# Patient Record
Sex: Female | Born: 1935 | Race: White | Hispanic: No | Smoking: Current every day smoker
Health system: Southern US, Community
[De-identification: ages and names within clinical notes are randomized; demographics above are authoritative.]

## PROBLEM LIST (undated history)

## (undated) DIAGNOSIS — F419 Anxiety disorder, unspecified: Secondary | ICD-10-CM

## (undated) DIAGNOSIS — N319 Neuromuscular dysfunction of bladder, unspecified: Secondary | ICD-10-CM

## (undated) DIAGNOSIS — R911 Solitary pulmonary nodule: Secondary | ICD-10-CM

## (undated) DIAGNOSIS — W19XXXA Unspecified fall, initial encounter: Secondary | ICD-10-CM

## (undated) DIAGNOSIS — F0390 Unspecified dementia without behavioral disturbance: Secondary | ICD-10-CM

## (undated) DIAGNOSIS — I10 Essential (primary) hypertension: Secondary | ICD-10-CM

## (undated) DIAGNOSIS — J449 Chronic obstructive pulmonary disease, unspecified: Secondary | ICD-10-CM

---

## 2005-12-05 ENCOUNTER — Other Ambulatory Visit: Payer: Self-pay

## 2005-12-05 ENCOUNTER — Emergency Department: Payer: Self-pay | Admitting: Emergency Medicine

## 2008-01-15 ENCOUNTER — Ambulatory Visit: Payer: Self-pay | Admitting: Internal Medicine

## 2008-02-17 ENCOUNTER — Ambulatory Visit: Payer: Self-pay | Admitting: Internal Medicine

## 2009-02-10 ENCOUNTER — Emergency Department: Payer: Self-pay | Admitting: Emergency Medicine

## 2015-06-07 ENCOUNTER — Inpatient Hospital Stay: Payer: Medicare Other

## 2015-06-07 ENCOUNTER — Emergency Department: Payer: Medicare Other

## 2015-06-07 ENCOUNTER — Encounter: Payer: Self-pay | Admitting: Emergency Medicine

## 2015-06-07 ENCOUNTER — Inpatient Hospital Stay
Admission: EM | Admit: 2015-06-07 | Discharge: 2015-06-21 | DRG: 870 | Disposition: A | Discharge status: Hospice | Payer: Medicare Other | Attending: Internal Medicine | Admitting: Internal Medicine

## 2015-06-07 DIAGNOSIS — I700 Atherosclerosis of aorta: Secondary | ICD-10-CM | POA: Diagnosis present

## 2015-06-07 DIAGNOSIS — Y929 Unspecified place or not applicable: Secondary | ICD-10-CM | POA: Diagnosis not present

## 2015-06-07 DIAGNOSIS — Z452 Encounter for adjustment and management of vascular access device: Secondary | ICD-10-CM

## 2015-06-07 DIAGNOSIS — R6521 Severe sepsis with septic shock: Secondary | ICD-10-CM | POA: Diagnosis present

## 2015-06-07 DIAGNOSIS — I10 Essential (primary) hypertension: Secondary | ICD-10-CM | POA: Diagnosis present

## 2015-06-07 DIAGNOSIS — J9801 Acute bronchospasm: Secondary | ICD-10-CM | POA: Diagnosis present

## 2015-06-07 DIAGNOSIS — R627 Adult failure to thrive: Secondary | ICD-10-CM | POA: Diagnosis present

## 2015-06-07 DIAGNOSIS — Z66 Do not resuscitate: Secondary | ICD-10-CM | POA: Diagnosis not present

## 2015-06-07 DIAGNOSIS — F0280 Dementia in other diseases classified elsewhere without behavioral disturbance: Secondary | ICD-10-CM | POA: Diagnosis present

## 2015-06-07 DIAGNOSIS — J690 Pneumonitis due to inhalation of food and vomit: Secondary | ICD-10-CM | POA: Diagnosis present

## 2015-06-07 DIAGNOSIS — E872 Acidosis: Secondary | ICD-10-CM | POA: Diagnosis present

## 2015-06-07 DIAGNOSIS — R911 Solitary pulmonary nodule: Secondary | ICD-10-CM | POA: Diagnosis present

## 2015-06-07 DIAGNOSIS — L8990 Pressure ulcer of unspecified site, unspecified stage: Secondary | ICD-10-CM | POA: Insufficient documentation

## 2015-06-07 DIAGNOSIS — Z515 Encounter for palliative care: Secondary | ICD-10-CM | POA: Diagnosis not present

## 2015-06-07 DIAGNOSIS — E43 Unspecified severe protein-calorie malnutrition: Secondary | ICD-10-CM | POA: Diagnosis present

## 2015-06-07 DIAGNOSIS — J9622 Acute and chronic respiratory failure with hypercapnia: Secondary | ICD-10-CM | POA: Diagnosis present

## 2015-06-07 DIAGNOSIS — W19XXXA Unspecified fall, initial encounter: Secondary | ICD-10-CM | POA: Diagnosis present

## 2015-06-07 DIAGNOSIS — J9601 Acute respiratory failure with hypoxia: Secondary | ICD-10-CM | POA: Diagnosis present

## 2015-06-07 DIAGNOSIS — G309 Alzheimer's disease, unspecified: Secondary | ICD-10-CM | POA: Diagnosis present

## 2015-06-07 DIAGNOSIS — N390 Urinary tract infection, site not specified: Secondary | ICD-10-CM

## 2015-06-07 DIAGNOSIS — F17210 Nicotine dependence, cigarettes, uncomplicated: Secondary | ICD-10-CM | POA: Diagnosis present

## 2015-06-07 DIAGNOSIS — E860 Dehydration: Secondary | ICD-10-CM | POA: Diagnosis present

## 2015-06-07 DIAGNOSIS — Z4659 Encounter for fitting and adjustment of other gastrointestinal appliance and device: Secondary | ICD-10-CM

## 2015-06-07 DIAGNOSIS — S225XXA Flail chest, initial encounter for closed fracture: Secondary | ICD-10-CM | POA: Diagnosis present

## 2015-06-07 DIAGNOSIS — J189 Pneumonia, unspecified organism: Secondary | ICD-10-CM

## 2015-06-07 DIAGNOSIS — J9600 Acute respiratory failure, unspecified whether with hypoxia or hypercapnia: Secondary | ICD-10-CM

## 2015-06-07 DIAGNOSIS — M549 Dorsalgia, unspecified: Secondary | ICD-10-CM

## 2015-06-07 DIAGNOSIS — J9621 Acute and chronic respiratory failure with hypoxia: Secondary | ICD-10-CM | POA: Insufficient documentation

## 2015-06-07 DIAGNOSIS — J9690 Respiratory failure, unspecified, unspecified whether with hypoxia or hypercapnia: Secondary | ICD-10-CM

## 2015-06-07 DIAGNOSIS — E876 Hypokalemia: Secondary | ICD-10-CM | POA: Diagnosis present

## 2015-06-07 DIAGNOSIS — J9811 Atelectasis: Secondary | ICD-10-CM | POA: Diagnosis present

## 2015-06-07 DIAGNOSIS — Z681 Body mass index (BMI) 19 or less, adult: Secondary | ICD-10-CM | POA: Diagnosis not present

## 2015-06-07 DIAGNOSIS — J439 Emphysema, unspecified: Secondary | ICD-10-CM | POA: Diagnosis not present

## 2015-06-07 DIAGNOSIS — Z79899 Other long term (current) drug therapy: Secondary | ICD-10-CM | POA: Diagnosis not present

## 2015-06-07 DIAGNOSIS — R1310 Dysphagia, unspecified: Secondary | ICD-10-CM | POA: Diagnosis present

## 2015-06-07 DIAGNOSIS — J441 Chronic obstructive pulmonary disease with (acute) exacerbation: Secondary | ICD-10-CM | POA: Diagnosis present

## 2015-06-07 DIAGNOSIS — R64 Cachexia: Secondary | ICD-10-CM | POA: Diagnosis present

## 2015-06-07 DIAGNOSIS — M545 Low back pain: Secondary | ICD-10-CM | POA: Diagnosis present

## 2015-06-07 DIAGNOSIS — R739 Hyperglycemia, unspecified: Secondary | ICD-10-CM | POA: Diagnosis not present

## 2015-06-07 DIAGNOSIS — A419 Sepsis, unspecified organism: Secondary | ICD-10-CM | POA: Diagnosis present

## 2015-06-07 DIAGNOSIS — R0902 Hypoxemia: Secondary | ICD-10-CM

## 2015-06-07 DIAGNOSIS — J449 Chronic obstructive pulmonary disease, unspecified: Secondary | ICD-10-CM | POA: Diagnosis not present

## 2015-06-07 DIAGNOSIS — Z978 Presence of other specified devices: Secondary | ICD-10-CM

## 2015-06-07 HISTORY — DX: Unspecified dementia, unspecified severity, without behavioral disturbance, psychotic disturbance, mood disturbance, and anxiety: F03.90

## 2015-06-07 HISTORY — DX: Chronic obstructive pulmonary disease, unspecified: J44.9

## 2015-06-07 HISTORY — DX: Essential (primary) hypertension: I10

## 2015-06-07 LAB — URINALYSIS COMPLETE WITH MICROSCOPIC (ARMC ONLY)
BILIRUBIN URINE: NEGATIVE
Glucose, UA: 50 mg/dL — AB
Ketones, ur: NEGATIVE mg/dL
Nitrite: NEGATIVE
Protein, ur: 100 mg/dL — AB
Specific Gravity, Urine: 1.027 (ref 1.005–1.030)
pH: 5 (ref 5.0–8.0)

## 2015-06-07 LAB — CBC WITH DIFFERENTIAL/PLATELET
BASOS PCT: 1 %
Basophils Absolute: 0.4 10*3/uL — ABNORMAL HIGH (ref 0–0.1)
EOS PCT: 0 %
Eosinophils Absolute: 0 10*3/uL (ref 0–0.7)
HCT: 45.4 % (ref 35.0–47.0)
Hemoglobin: 15.3 g/dL (ref 12.0–16.0)
LYMPHS ABS: 1.3 10*3/uL (ref 1.0–3.6)
Lymphocytes Relative: 3 %
MCH: 28.6 pg (ref 26.0–34.0)
MCHC: 33.6 g/dL (ref 32.0–36.0)
MCV: 85.2 fL (ref 80.0–100.0)
MONO ABS: 3.4 10*3/uL — AB (ref 0.2–0.9)
Monocytes Relative: 8 %
NEUTROS ABS: 37 10*3/uL — AB (ref 1.4–6.5)
Neutrophils Relative %: 88 %
PLATELETS: 375 10*3/uL (ref 150–440)
RBC: 5.33 MIL/uL — ABNORMAL HIGH (ref 3.80–5.20)
RDW: 13.6 % (ref 11.5–14.5)
WBC: 42.1 10*3/uL — AB (ref 3.6–11.0)

## 2015-06-07 LAB — COMPREHENSIVE METABOLIC PANEL
ALT: 14 U/L (ref 14–54)
AST: 34 U/L (ref 15–41)
Albumin: 3.4 g/dL — ABNORMAL LOW (ref 3.5–5.0)
Alkaline Phosphatase: 99 U/L (ref 38–126)
Anion gap: 10 (ref 5–15)
BUN: 29 mg/dL — ABNORMAL HIGH (ref 6–20)
CALCIUM: 9.9 mg/dL (ref 8.9–10.3)
CHLORIDE: 101 mmol/L (ref 101–111)
CO2: 29 mmol/L (ref 22–32)
CREATININE: 0.85 mg/dL (ref 0.44–1.00)
GFR calc Af Amer: >60 mL/min (ref >60)
Glucose, Bld: 139 mg/dL — ABNORMAL HIGH (ref 65–99)
Potassium: 2.8 mmol/L — CL (ref 3.5–5.1)
Sodium: 140 mmol/L (ref 135–145)
TOTAL PROTEIN: 8 g/dL (ref 6.5–8.1)
Total Bilirubin: 0.7 mg/dL (ref 0.3–1.2)

## 2015-06-07 LAB — LACTIC ACID, PLASMA
Lactic Acid, Venous: 1.9 mmol/L (ref 0.5–2.0)
Lactic Acid, Venous: 1.1 mmol/L (ref 0.5–2.0)

## 2015-06-07 LAB — TROPONIN I: TROPONIN I: 0.03 ng/mL (ref <0.031)

## 2015-06-07 LAB — BRAIN NATRIURETIC PEPTIDE: B NATRIURETIC PEPTIDE 5: 127 pg/mL — AB (ref 0.0–100.0)

## 2015-06-07 MED ORDER — SODIUM CHLORIDE 0.9 % IV BOLUS (SEPSIS)
1000.0000 mL | Freq: Once | INTRAVENOUS | Status: AC
  Administered 2015-06-07: 1000 mL via INTRAVENOUS

## 2015-06-07 MED ORDER — LIDOCAINE HCL (PF) 1 % IJ SOLN
5.0000 mL | Freq: Once | INTRAMUSCULAR | Status: AC
  Administered 2015-06-07: 5 mL

## 2015-06-07 MED ORDER — LIDOCAINE HCL (PF) 1 % IJ SOLN
INTRAMUSCULAR | Status: AC
  Administered 2015-06-07: 5 mL
  Filled 2015-06-07: qty 10

## 2015-06-07 MED ORDER — PIPERACILLIN-TAZOBACTAM 3.375 G IVPB
3.3750 g | Freq: Once | INTRAVENOUS | Status: AC
  Administered 2015-06-07: 3.375 g via INTRAVENOUS
  Filled 2015-06-07: qty 50

## 2015-06-07 MED ORDER — NOREPINEPHRINE 4 MG/250ML-% IV SOLN
INTRAVENOUS | Status: AC
  Filled 2015-06-07: qty 250

## 2015-06-07 MED ORDER — SODIUM CHLORIDE 0.9 % IV SOLN
Freq: Once | INTRAVENOUS | Status: AC
  Administered 2015-06-08: 01:00:00 via INTRAVENOUS

## 2015-06-07 MED ORDER — NOREPINEPHRINE 4 MG/250ML-% IV SOLN
0.0000 ug/min | INTRAVENOUS | Status: DC
Start: 2015-06-07 — End: 2015-06-09
  Administered 2015-06-07: 5.34 ug/min via INTRAVENOUS
  Administered 2015-06-08 (×2): 26.933 ug/min via INTRAVENOUS
  Filled 2015-06-07 (×8): qty 250

## 2015-06-07 MED ORDER — HALOPERIDOL LACTATE 5 MG/ML IJ SOLN
2.0000 mg | Freq: Once | INTRAMUSCULAR | Status: AC
  Administered 2015-06-07: 2 mg via INTRAVENOUS

## 2015-06-07 MED ORDER — HYDROCODONE-ACETAMINOPHEN 5-325 MG PO TABS: 1.0000 | ORAL_TABLET | ORAL | Status: DC | PRN

## 2015-06-07 MED ORDER — VANCOMYCIN HCL IN DEXTROSE 1-5 GM/200ML-% IV SOLN
1000.0000 mg | Freq: Once | INTRAVENOUS | Status: AC
  Administered 2015-06-07: 1000 mg via INTRAVENOUS
  Filled 2015-06-07: qty 200

## 2015-06-07 MED ORDER — LORAZEPAM 2 MG/ML IJ SOLN
INTRAMUSCULAR | Status: AC
  Administered 2015-06-07: 2 mg via INTRAVENOUS
  Filled 2015-06-07: qty 1

## 2015-06-07 MED ORDER — LIDOCAINE HCL (PF) 1 % IJ SOLN
INTRAMUSCULAR | Status: AC
  Administered 2015-06-07: 5 mL
  Filled 2015-06-07: qty 20

## 2015-06-07 MED ORDER — POTASSIUM CHLORIDE 10 MEQ/100ML IV SOLN
10.0000 meq | Freq: Once | INTRAVENOUS | Status: AC
Start: 2015-06-07 — End: 2015-06-07
  Administered 2015-06-07: 10 meq via INTRAVENOUS
  Filled 2015-06-07: qty 100

## 2015-06-07 MED ORDER — SODIUM CHLORIDE 0.9 % IV SOLN
Freq: Once | INTRAVENOUS | Status: AC
Start: 2015-06-07 — End: 2015-06-08
  Administered 2015-06-07: 21:00:00 via INTRAVENOUS

## 2015-06-07 MED ORDER — IOPAMIDOL (ISOVUE-370) INJECTION 76%
75.0000 mL | Freq: Once | INTRAVENOUS | Status: AC | PRN
  Administered 2015-06-07: 75 mL via INTRAVENOUS

## 2015-06-07 MED ORDER — HALOPERIDOL LACTATE 5 MG/ML IJ SOLN
5.0000 mg | Freq: Once | INTRAMUSCULAR | Status: AC
  Administered 2015-06-07: 5 mg via INTRAVENOUS

## 2015-06-07 MED ORDER — POTASSIUM CHLORIDE 10 MEQ/100ML IV SOLN
10.0000 meq | Freq: Once | INTRAVENOUS | Status: AC
  Administered 2015-06-07: 10 meq via INTRAVENOUS
  Filled 2015-06-07: qty 100

## 2015-06-07 MED ORDER — HALOPERIDOL LACTATE 5 MG/ML IJ SOLN
INTRAMUSCULAR | Status: AC
  Administered 2015-06-07: 5 mg via INTRAVENOUS
  Filled 2015-06-07: qty 1

## 2015-06-07 MED ORDER — NOREPINEPHRINE BITARTRATE 1 MG/ML IV SOLN
0.1000 ug/kg/min | Freq: Once | INTRAVENOUS | Status: DC
  Administered 2015-06-07: 0.1 ug/kg/min via INTRAVENOUS

## 2015-06-07 MED ORDER — LORAZEPAM 2 MG/ML IJ SOLN
2.0000 mg | Freq: Once | INTRAMUSCULAR | Status: AC
  Administered 2015-06-07: 2 mg via INTRAVENOUS

## 2015-06-07 MED ORDER — AZITHROMYCIN 500 MG IV SOLR
500.0000 mg | Freq: Once | INTRAVENOUS | Status: AC
  Administered 2015-06-07: 500 mg via INTRAVENOUS
  Filled 2015-06-07: qty 500

## 2015-06-07 MED ORDER — HALOPERIDOL LACTATE 5 MG/ML IJ SOLN
2.0000 mg | Freq: Four times a day (QID) | INTRAMUSCULAR | Status: DC | PRN
  Filled 2015-06-07: qty 1

## 2015-06-07 NOTE — ED Notes (Signed)
Report given to Chad, RN.

## 2015-06-07 NOTE — H&P (Signed)
[facility] - [place] at [facility]   PATIENT NAME: Leah [name]    MR#:  [id number]  DATE OF BIRTH:  05-04-1932  DATE OF ADMISSION:  06/07/2015  PRIMARY CARE PHYSICIAN: Jaclyn Shaggy, MD   REQUESTING/REFERRING PHYSICIAN: Dr. Dorothea Glassman  CHIEF COMPLAINT:   Chief Complaint  Patient presents with  . Failure To Thrive  . Fall    HISTORY OF PRESENT ILLNESS:  Leah [name]  is a 82 y.o. female with a known history of dementia, COPD, essential hypertension who presents to the hospital due to generalized weakness shortness of breath cough and congestion. She herself has advanced dementia most of the history obtained from the sons at bedside. As per the son and he came today to see her mother to take her to her doctor's appointment when he noticed it she was too weak to get out of bed. Patient has had a fall about 2 days ago and has been not feeling well since then. She does have a long history of tobacco abuse and continues to smoke about a pack a day. The sons noticed that she's had a upper airway cough and congestion and feels more short of breath and therefore brought her to the ER for further evaluation. Emergency room patient was noted to be hypoxic or tachycardic and noted to have a CT chest findings suggestive of aspiration pneumonia. Hospitalist services were contacted further treatment and evaluation.  PAST MEDICAL HISTORY:   Past Medical History  Diagnosis Date  . Dementia   . COPD (chronic obstructive pulmonary disease) (HCC)   . Essential hypertension     PAST SURGICAL HISTORY:  History reviewed. No pertinent past surgical history.  SOCIAL HISTORY:   Social History  Substance Use Topics  . Smoking status: Current Every Day Smoker -- 1.00 packs/day for 60 years    Types: Cigarettes  . Smokeless tobacco: Never Used  . Alcohol Use: No    FAMILY HISTORY:  History reviewed. No pertinent family history.  Could not obtain due to patient's  advanced Dementia.   DRUG ALLERGIES:  No Known Allergies  REVIEW OF SYSTEMS:   Review of Systems  Unable to perform ROS: mental acuity    MEDICATIONS AT HOME:   Prior to Admission medications   Medication Sig Start Date End Date Taking? Authorizing Provider  donepezil (ARICEPT) 5 MG tablet Take 5 mg by mouth at bedtime.   Yes Historical Provider, MD  hydrochlorothiazide (HYDRODIURIL) 25 MG tablet Take 25 mg by mouth daily.   Yes Historical Provider, MD  ibuprofen (ADVIL,MOTRIN) 200 MG tablet Take 400 mg by mouth every 6 (six) hours as needed.   Yes Historical Provider, MD  verapamil (CALAN-SR) 240 MG CR tablet Take 240 mg by mouth daily.   Yes Historical Provider, MD      VITAL SIGNS:  Pulse 91, temperature 99.1 F (37.3 C), temperature source Oral, resp. rate 34, height  (1.676 m), weight 53.434 kg (117 lb 12.8 oz), SpO2 91 %.  PHYSICAL EXAMINATION:  Physical Exam  GENERAL:  82 y.o.-year-old unkempt patient lying in the bed in moderate Resp. Distress.  EYES: Pupils equal, round, reactive to light and accommodation. No scleral icterus. Extraocular muscles intact.  HEENT: Head atraumatic, normocephalic. Oropharynx and nasopharynx clear. No oropharyngeal erythema, dry oral mucosa.  NECK:  Supple, no jugular venous distention. No thyroid enlargement, no tenderness.  LUNGS: Prolonged inspiratory and expiratory phase, diffuse wheezing, rhonchi bilaterally. Negative use of accessory muscles. CARDIOVASCULAR: S1, S2 RRR,  tachycardic. No murmurs, rubs, gallops, clicks.  ABDOMEN: Soft, nontender, nondistended. Bowel sounds present. No organomegaly or mass.  EXTREMITIES: No pedal edema, cyanosis, + clubbing b/l. + 2 pedal & radial pulses b/l.   NEUROLOGIC: Cranial nerves II through XII are intact. No focal Motor or sensory deficits appreciated b/l.  Globally weak PSYCHIATRIC: The patient is alert and oriented x 1.  SKIN: No obvious rash, lesion, or ulcer.   LABORATORY PANEL:    CBC  Recent Labs Lab 06/07/15 1132  WBC 42.1*  HGB 15.3  HCT 45.4  PLT 375   ------------------------------------------------------------------------------------------------------------------  Chemistries   Recent Labs Lab 06/07/15 1132  NA 140  K 2.8*  CL 101  CO2 29  GLUCOSE 139*  BUN 29*  CREATININE 0.85  CALCIUM 9.9  AST 34  ALT 14  ALKPHOS 99  BILITOT 0.7   ------------------------------------------------------------------------------------------------------------------  Cardiac Enzymes  Recent Labs Lab 06/07/15 1132  TROPONINI 0.03   ------------------------------------------------------------------------------------------------------------------  RADIOLOGY:  Ct Head Wo Contrast  06/07/2015  CLINICAL DATA:  Altered mental status. EXAM: CT HEAD WITHOUT CONTRAST TECHNIQUE: Contiguous axial images were obtained from the base of the skull through the vertex without intravenous contrast. COMPARISON:  None. FINDINGS: Bony calvarium appears intact. Mild diffuse cortical atrophy is noted. Moderate chronic ischemic white matter disease is noted. No mass effect or midline shift is noted. Ventricular size is within normal limits. There is no evidence of mass lesion, hemorrhage or acute infarction. IMPRESSION: Mild diffuse cortical atrophy. Moderate chronic ischemic white matter disease. No acute intracranial abnormality seen. Electronically Signed   By: Lupita RaiderJames  Green Jr, M.D.   On: 06/07/2015 13:35   Ct Angio Chest Pe W/cm &/or Wo Cm  06/07/2015  CLINICAL DATA:  82 year old female with hypoxia, abnormal EKG, low grade fever. Initial encounter. EXAM: CT ANGIOGRAPHY CHEST WITH CONTRAST TECHNIQUE: Multidetector CT imaging of the chest was performed using the standard protocol during bolus administration of intravenous contrast. Multiplanar CT image reconstructions and MIPs were obtained to evaluate the vascular anatomy. CONTRAST:  75 mL Isovue 370 COMPARISON:  Chest  radiographs 1120 hours today and earlier. FINDINGS: Good contrast bolus timing in the pulmonary arterial tree. No focal filling defect identified in the pulmonary arterial tree to suggest the presence of acute pulmonary embolism. 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 The left vertebral artery arises directly from the arch and is patent. Other visualized proximal great vessels are patent. There is severe aortic atherosclerosis with calcified and bulky soft plaque or mural thrombus. Ulcerated plaque, most notable in the descending thoracic aorta (series 5, image 207). Intermittent retained secretions in the trachea. Complete opacification of the right lower lobe bronchus with subtotal consolidation of the right lower lobe. Air bronchograms. Trace right pleural effusion. In the aerated portion of the superior segment of the right lower lobe there is peribronchial and tree-in-bud nodularity. Superimposed centrilobular emphysema in both lungs. Mild streaky opacity in the posterior right middle lobe. Spiculated 10 mm left upper lobe lung nodule (coronal image 67 of series 9). Small benign appearing lingula subpleural nodule on series 6, image 105. No left pleural or pericardial effusion. Reactive appearing right hilar lymph nodes, individually up to 10 mm. No mediastinal lymphadenopathy. No axillary lymphadenopathy. Negative visualized liver, spleen, pancreas, adrenal glands, kidneys, and bowel in the upper abdomen. Mild chronic appearing inferior T10 endplate compression. Elsewhere the visible spine appears intact. Sternum appears intact. There is some respiratory motion artifact affecting detail of the  ribs. However, the right posterior tenth eleventh, and twelfth ribs are each fractured in at least 2 places. Moderate angulation of the tenth and eleventh rib fractures. Mildly displaced single fracture of the lateral ninth rib identified. See series 6. Review of  the MIP images confirms the above findings. IMPRESSION: 1.  No evidence of acute pulmonary embolus. 2. Acute appearing comminuted and angulated fractures of the posterior right ninth through twelfth ribs with flail segments. 3. Subtotal right lower lobe consolidation. Opacified right mainstem bronchus, with multifocal retained secretions in the trachea. Reactive appearing right hilar lymphadenopathy. Top differential considerations include pneumonia following a large aspiration versus following right rib fractures. No significant pleural effusion. 4. Spiculated left upper lobe 10 mm lung nodule suspicious for small bronchogenic carcinoma. Consider one of the following in 3 months: (a) repeat chest CT, (b) follow-up PET-CT, or (c) tissue sampling. This recommendation follows the consensus statement: Guidelines for Management of Incidental Pulmonary Nodules Detected on CT Images:From the Fleischner Society 2017; published online before print (10.1148/radiol.1610960454). 5. Severe aortic atherosclerosis. Areas of ulcerated plaque most pronounced in the descending thoracic aorta. 6. 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 Electronically Signed   By: Odessa Fleming M.D.   On: 06/07/2015 13:43   Dg Chest Portable 1 View  06/07/2015  CLINICAL DATA:  Shortness of breath, dementia, history of Alzheimer's, cough, congestion EXAM: PORTABLE CHEST 1 VIEW COMPARISON:  02/10/2009 FINDINGS: Cardiomediastinal silhouette is stable. The patient is rotated to the right. Hazy right basilar atelectasis or an infiltrate. No pulmonary edema. Left lung is clear. IMPRESSION: Hazy right basilar atelectasis or early infiltrate. No pulmonary edema. Left lung is clear. Electronically Signed   By: Natasha Mead M.D.   On: 06/07/2015 11:35     IMPRESSION AND PLAN:   82 year old female with past medical history of advanced dementia, hypertension, COPD who presents to the hospital due to generalized  weakness and noted to be in acute respiratory failure with hypoxia.  #1 acute respiratory failure with hypoxia-this is secondary to underlying COPD exacerbation, came with pneumonia. -Continue treatment for underlying COPD with IV steroids, DuoNeb's, Pulmicort nebs and also empiric antibiotics for pneumonia. -Continue O2 supplementation and will monitor.  #2 COPD exacerbation-due to pneumonia and ongoing tobacco abuse. -I will start the patient on IV steroids, DuoNeb nebs around-the-clock, Pulmicort nebs. -Continue IV Zosyn for aspiration pneumonia. She will need to be assessed for home oxygen prior to discharge.  #3 pneumonia-likely aspiration. -Continue IV Zosyn. Follow blood, sputum cultures. -We'll get speech therapy evaluation to assess swallowing.  #4 essential hypertension-patient's blood pressures on the low side given her respiratory failure presently.  -Hold antihypertensives.  #5 hypokalemia-will replace accordingly and repeat level in the morning. Check magnesium level.  #6 back pain-patient had a recent fall. I will get x-ray of her lumbar spine.  #7 spiculated lung nodule-this was noted on the CT chest. Patient did have a long history of tobacco abuse and high risk for malignancy. She would benefit from a PET scan as an outpatient. -Patient has no acute hemoptysis. Consider pulmonary consult if needed  #8. Leukocytosis-secondary to the pneumonia. Follow white cell count with IV antibiotic therapy.  #9 history of falls-the physical therapy evaluation to assess mobility.   All the records are reviewed and case discussed with ED provider. Management plans discussed with the patient, family and they are in agreement.  CODE STATUS: DO NOT RESUSCITATE  TOTAL TIME TAKING CARE OF THIS PATIENT: 50 minutes.  Houston Siren M.D on 06/07/2015 at 3:19 PM  Between 7am to 6pm - Pager - [phone]  After 6pm go to [email or web address] - password [email or web address] [facility]  [place]  Hospitalists  Office  [phone]  CC: Primary care physician; Jaclyn Shaggy, MD

## 2015-06-07 NOTE — ED Notes (Signed)
Assist MD Melinda to set up and insert femoral triple lumen central line. Pt tolerated well. Sterile field maintained, each lumen has good blood return and flushes well.

## 2015-06-07 NOTE — ED Notes (Signed)
Pt placed of 40% FiO2 on a venti mask.

## 2015-06-07 NOTE — ED Notes (Signed)
Pt presents from home where she lives with her daughter and grandson. EMS was called to the house by her son. Per EMS the son states that pt has been refusing meds, and not eating. EMS states that pt has a hx of dementia and alzheimer's at this time. EMS states that when they asked the patient's son about changes and the son was unable to give specific details. EMS states that pt has chronic congested cough and continues to smoke at home. EMS reports an O2 sat of 86% on RA, placed on 2L en route, pt placed on 5L in ED and patient O2 sat 91%. Pt presents alert and oriented to self and place, but disoriented time, and situation.

## 2015-06-07 NOTE — ED Provider Notes (Addendum)
[facility] Emergency Department Provider Note  ____________________________________________  Time seen: Approximately 11:23 AM  I have reviewed the triage vital signs and the nursing notes.   HISTORY  Chief Complaint Failure To Thrive and Fall  history limited by dementia   HPI Leah [name] is a 82 y.o. female who lives at home. She is demented but has been getting less and less cooperative refusing her medicines and today she appeared to be breathing hard. She is a chronic smoker. She reports she has a chronic cough. In the emergency room patient's O2 sats are low on 5 L is only reading 90%. She has a temperature of 99 and 9 deep rattly wet cough. Patient also complains of some pain in her upper back when she tries to sit up. It does not bother her at all otherwise.   Past Medical History  Diagnosis Date  . Dementia   . COPD (chronic obstructive pulmonary disease) (HCC)   . Essential hypertension     Patient Active Problem List   Diagnosis Date Noted  . Sepsis (HCC) 06/07/2015    History reviewed. No pertinent past surgical history.  Current Outpatient Rx  Name  Route  Sig  Dispense  Refill  . donepezil (ARICEPT) 5 MG tablet   Oral   Take 5 mg by mouth at bedtime.         . hydrochlorothiazide (HYDRODIURIL) 25 MG tablet   Oral   Take 25 mg by mouth daily.         Marland Kitchen. ibuprofen (ADVIL,MOTRIN) 200 MG tablet   Oral   Take 400 mg by mouth every 6 (six) hours as needed.         . verapamil (CALAN-SR) 240 MG CR tablet   Oral   Take 240 mg by mouth daily.           Allergies Review of patient's allergies indicates no known allergies.  History reviewed. No pertinent family history.  Social History Social History  Substance Use Topics  . Smoking status: Current Every Day Smoker -- 1.00 packs/day for 60 years    Types: Cigarettes  . Smokeless tobacco: Never Used  . Alcohol Use: No    Review of Systems Constitutional: No  fever/chills Eyes: No visual changes. ENT: No sore throat. Cardiovascular: Denies chest pain. Respiratory: Denies shortness of breath. Gastrointestinal: No abdominal pain.  No nausea, no vomiting.  No diarrhea.  No constipation. Genitourinary: Negative for dysuria. Musculoskeletal: Negative for back pain. Skin: Negative for rash. Neurological: Negative for headaches, focal weakness or numbness.  10-point ROS otherwise negative.  ____________________________________________   PHYSICAL EXAM:  VITAL SIGNS: ED Triage Vitals  Enc Vitals Group     BP --      Pulse Rate 06/07/15 1112 91     Resp 06/07/15 1112 34     Temp 06/07/15 1112 99.1 F (37.3 C)     Temp Source 06/07/15 1112 Oral     SpO2 06/07/15 1108 86 %     Weight 06/07/15 1112 117 lb 12.8 oz (53.434 kg)     Height 06/07/15 1112 5\' 6"  (1.676 m)     Head Cir --      Peak Flow --      Pain Score --      Pain Loc --      Pain Edu? --      Excl. in GC? --     Constitutional: Alert and oriented. Well appearing and in no acute  distress but has a deep wet cough. Eyes: Conjunctivae are normal. PERRL. EOMI. Head: Atraumatic. Nose: No congestion/rhinnorhea. Mouth/Throat: Mucous membranes are moist.  Oropharynx non-erythematous. Neck: No stridor. Cardiovascular: Normal rate, regular rhythm. Grossly normal heart sounds.  Good peripheral circulation. Respiratory: Normal respiratory effort.  No retractions. Lungs scattered crackles Gastrointestinal: Soft and nontender. No distention. No abdominal bruits. No CVA tenderness. Musculoskeletal: No lower extremity tenderness nor edema.  No joint effusions. Neurologic:  Normal speech and language. No gross focal neurologic deficits are appreciated. No gait instability. Skin:  Skin is warm, dry and intact. No rash noted. Psychiatric: Mood and affect are normal. Speech and behavior are normal.  ____________________________________________   LABS (all labs ordered are listed, but  only abnormal results are displayed)  Labs Reviewed  COMPREHENSIVE METABOLIC PANEL - Abnormal; Notable for the following:    Potassium 2.8 (*)    Glucose, Bld 139 (*)    BUN 29 (*)    Albumin 3.4 (*)    All other components within normal limits  BRAIN NATRIURETIC PEPTIDE - Abnormal; Notable for the following:    B Natriuretic Peptide 127.0 (*)    All other components within normal limits  CBC WITH DIFFERENTIAL/PLATELET - Abnormal; Notable for the following:    WBC 42.1 (*)    RBC 5.33 (*)    Neutro Abs 37.0 (*)    Monocytes Absolute 3.4 (*)    Basophils Absolute 0.4 (*)    All other components within normal limits  URINALYSIS COMPLETEWITH MICROSCOPIC ([facility] ONLY) - Abnormal; Notable for the following:    Color, Urine AMBER (*)    APPearance CLOUDY (*)    Glucose, UA 50 (*)    Hgb urine dipstick 2+ (*)    Protein, ur 100 (*)    Leukocytes, UA TRACE (*)    Bacteria, UA RARE (*)    Squamous Epithelial / LPF 0-5 (*)    All other components within normal limits  CULTURE, BLOOD (ROUTINE X 2)  CULTURE, BLOOD (ROUTINE X 2)  TROPONIN I  LACTIC ACID, PLASMA  LACTIC ACID, PLASMA   ____________________________________________  EKG  EKG read and interpreted by me shows sinus rhythm 87 right axis nonspecific ST-T wave changes very poor baseline. ____________________________________________  RADIOLOGY  Chest x-ray shows some right sided haziness in right lower lobe area radiology x-ray reviewed by me CT reviewed by me appears to show a right lower lobe infiltrate fairly dense. ____________________________________________   PROCEDURES  Critical care time one and three-quarter hours this includes reviewing the patient's films talking to the family for times inserting a central line talking to the hospitalist etc.  ____________________________________________   INITIAL IMPRESSION / ASSESSMENT AND PLAN / ED COURSE  Pertinent labs & imaging results that were available during my  care of the patient were reviewed by me and considered in my medical decision making (see chart for details).   ____________________________________________   FINAL CLINICAL IMPRESSION(S) / ED DIAGNOSES  Final diagnoses:  Community acquired pneumonia  Urinary tract infection without hematuria, site unspecified  Hypoxia      Arnaldo Natal, MD 06/07/15 [phone] iew of the MIP images confirms the above findings.  IMPRESSION: 1. No evidence of acute pulmonary embolus. 2. Acute appearing comminuted and angulated fractures of the posterior right ninth through twelfth ribs with flail segments. 3. Subtotal right lower lobe consolidation. Opacified right mainstem bronchus, with multifocal retained secretions in the trachea. Reactive appearing right hilar lymphadenopathy. Top differential considerations include pneumonia following a large  aspiration versus following right rib fractures. No significant pleural effusion. 4. Spiculated left upper lobe 10 mm lung nodule suspicious for small bronchogenic carcinoma. Consider one of the following in 3 months: (a) repeat chest CT, (b) follow-up PET-CT, or (c) tissue sampling. This recommendation follows the consensus statement: Guidelines for Management of Incidental Pulmonary Nodules Detected on CT Images:From the Fleischner Society 2017; published online before print (10.1148/radiol.1610960454). 5. Severe aortic atherosclerosis. Areas of ulcerated plaque most pronounced in the descending thoracic aorta. 6. 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 Patient maintaining blood pressure in the 90s. She's had 2 L of saline already. We'll give a third liter. Discussed in detail with Dr.Sainani we will transfer the patient to [facility] as she will need an ICU bed and we have none here.  Arnaldo Natal, MD 06/07/15 1727  [facility] reports they only have one bed and I do not feel they can take the  patient because he needed that in case there is a code in the hospital.    Arnaldo Natal, MD 06/07/15 1740  There are no ICU beds at [facility] or [facility]. Discussed with Dr. Meryl Dare again we will keep the patient here for the time being. Patient keeps pulling her mask off and descending down into the 70s therefore I will put her on BiPAP. We will give her 2 mg of Haldol IV. Also attempted to put a central line and because after 2-1/2 L of saline her blood pressure still in the low 90s we will start her on some norepinephrine if need be. I discussed norepinephrine and the central line with the patient's son.  Arnaldo Natal, MD 06/07/15 1827  Consent obtained from patient and son orally. The patient prepped and draped in usual sterile fashion lidocaine injected in the area patient given a little bit of Haldol IV to help her stay still. The pain located with ultrasound and then the needle was inserted good blood flow was obtained central line inserted on the right side however could not get good return of blood after the central line was inserted. There was a hematoma present uncertain exactly what happened because we could get some blood flow but not reliably therefore the line was pulled area was compressed patient was given a little more Haldol and some Ativan if she moved somewhat on the other side the area on the left was then prepped and draped in usual sterile fashion and anesthetized with lidocaine and IV I mean the vein was again located with the ultrasound machine needle was inserted and advanced under ultrasound guidance good blood flow was obtained wire was inserted central line was inserted over that blood flow was obtained the Levophed and fluids were switched central line patient tolerated very well.  Arnaldo Natal, MD 06/07/15 2028  I discussed patient with her son in detail. He wishes to give her full care including intubaqtion but if her heart stops do not do CPR. He feels this  would break her ribs. I agree. We will do everything to attempt to prevent her from dying but if she does we will not attempt to bring her back/resucitate.  Arnaldo Natal, MD 06/07/15 [phone]

## 2015-06-07 NOTE — ED Notes (Signed)
MD notified of patient constantly pulling off Venti Mask. Per MD pt is to go on Bipap at this time.

## 2015-06-07 NOTE — ED Notes (Signed)
CODE SEPSIS CALLED TO D[facility] AT [facility] 

## 2015-06-08 ENCOUNTER — Inpatient Hospital Stay: Payer: Medicare Other

## 2015-06-08 DIAGNOSIS — J189 Pneumonia, unspecified organism: Secondary | ICD-10-CM | POA: Diagnosis present

## 2015-06-08 LAB — CBC WITH DIFFERENTIAL/PLATELET
BASOS ABS: 0 10*3/uL (ref 0–0.1)
BASOS PCT: 0 %
EOS ABS: 0 10*3/uL (ref 0–0.7)
Eosinophils Relative: 0 %
HCT: 37.7 % (ref 35.0–47.0)
HEMOGLOBIN: 11.8 g/dL — AB (ref 12.0–16.0)
Lymphocytes Relative: 2 %
Lymphs Abs: 0.6 10*3/uL — ABNORMAL LOW (ref 1.0–3.6)
MCH: 27.9 pg (ref 26.0–34.0)
MCHC: 31.4 g/dL — AB (ref 32.0–36.0)
MCV: 88.7 fL (ref 80.0–100.0)
Monocytes Absolute: 1 10*3/uL — ABNORMAL HIGH (ref 0.2–0.9)
Monocytes Relative: 4 %
NEUTROS PCT: 94 %
Neutro Abs: 24.1 10*3/uL — ABNORMAL HIGH (ref 1.4–6.5)
Platelets: 302 10*3/uL (ref 150–440)
RBC: 4.24 MIL/uL (ref 3.80–5.20)
RDW: 13.6 % (ref 11.5–14.5)
WBC: 25.8 10*3/uL — AB (ref 3.6–11.0)

## 2015-06-08 LAB — BASIC METABOLIC PANEL
Anion gap: 2 — ABNORMAL LOW (ref 5–15)
Anion gap: 6 (ref 5–15)
BUN: 18 mg/dL (ref 6–20)
BUN: 15 mg/dL (ref 6–20)
CALCIUM: 7.8 mg/dL — AB (ref 8.9–10.3)
CO2: 25 mmol/L (ref 22–32)
CO2: 21 mmol/L — ABNORMAL LOW (ref 22–32)
CREATININE: 0.63 mg/dL (ref 0.44–1.00)
Calcium: 7.3 mg/dL — ABNORMAL LOW (ref 8.9–10.3)
Chloride: 107 mmol/L (ref 101–111)
Chloride: 112 mmol/L — ABNORMAL HIGH (ref 101–111)
Creatinine, Ser: 0.59 mg/dL (ref 0.44–1.00)
GFR calc Af Amer: >60 mL/min (ref >60)
GLUCOSE: 357 mg/dL — AB (ref 65–99)
GLUCOSE: 166 mg/dL — AB (ref 65–99)
Potassium: 2.9 mmol/L — CL (ref 3.5–5.1)
Potassium: 3 mmol/L — ABNORMAL LOW (ref 3.5–5.1)
SODIUM: 134 mmol/L — AB (ref 135–145)
Sodium: 139 mmol/L (ref 135–145)

## 2015-06-08 LAB — CBC
HCT: 37 % (ref 35.0–47.0)
Hemoglobin: 12.2 g/dL (ref 12.0–16.0)
MCH: 28.4 pg (ref 26.0–34.0)
MCHC: 32.9 g/dL (ref 32.0–36.0)
MCV: 86.4 fL (ref 80.0–100.0)
PLATELETS: 318 10*3/uL (ref 150–440)
RBC: 4.28 MIL/uL (ref 3.80–5.20)
RDW: 14.3 % (ref 11.5–14.5)
WBC: 32.4 10*3/uL — AB (ref 3.6–11.0)

## 2015-06-08 LAB — COMPREHENSIVE METABOLIC PANEL
ALBUMIN: 1.8 g/dL — AB (ref 3.5–5.0)
ALK PHOS: 87 U/L (ref 38–126)
ALT: 7 U/L — AB (ref 14–54)
ANION GAP: 3 — AB (ref 5–15)
AST: 15 U/L (ref 15–41)
BUN: UNDETERMINED mg/dL (ref 6–20)
CALCIUM: 6.5 mg/dL — AB (ref 8.9–10.3)
CO2: 17 mmol/L — AB (ref 22–32)
Chloride: 120 mmol/L — ABNORMAL HIGH (ref 101–111)
Creatinine, Ser: UNDETERMINED mg/dL (ref 0.44–1.00)
GLUCOSE: 135 mg/dL — AB (ref 65–99)
Potassium: 2.6 mmol/L — CL (ref 3.5–5.1)
SODIUM: 140 mmol/L (ref 135–145)
Total Bilirubin: 0.7 mg/dL (ref 0.3–1.2)
Total Protein: 4.4 g/dL — ABNORMAL LOW (ref 6.5–8.1)

## 2015-06-08 LAB — GLUCOSE, CAPILLARY: Glucose-Capillary: 156 mg/dL — ABNORMAL HIGH (ref 65–99)

## 2015-06-08 MED ORDER — POTASSIUM CHLORIDE IN NACL 20-0.9 MEQ/L-% IV SOLN
INTRAVENOUS | Status: DC
  Administered 2015-06-08: 18:00:00 via INTRAVENOUS
  Filled 2015-06-08: qty 1000

## 2015-06-08 MED ORDER — ENOXAPARIN SODIUM 40 MG/0.4ML ~~LOC~~ SOLN
40.0000 mg | SUBCUTANEOUS | Status: DC
  Administered 2015-06-09: 40 mg via SUBCUTANEOUS
  Filled 2015-06-08: qty 0.4

## 2015-06-08 MED ORDER — PIPERACILLIN-TAZOBACTAM 3.375 G IVPB
3.3750 g | Freq: Three times a day (TID) | INTRAVENOUS | Status: AC
  Administered 2015-06-08 – 2015-06-18 (×30): 3.375 g via INTRAVENOUS
  Filled 2015-06-08 (×30): qty 50

## 2015-06-08 MED ORDER — POTASSIUM CHLORIDE IN NACL 20-0.45 MEQ/L-% IV SOLN
INTRAVENOUS | Status: DC
  Administered 2015-06-08 – 2015-06-09 (×4): via INTRAVENOUS
  Filled 2015-06-08 (×3): qty 1000

## 2015-06-08 MED ORDER — BUDESONIDE 0.25 MG/2ML IN SUSP
0.2500 mg | Freq: Two times a day (BID) | RESPIRATORY_TRACT | Status: DC
  Administered 2015-06-08 – 2015-06-20 (×24): 0.25 mg via RESPIRATORY_TRACT
  Filled 2015-06-08 (×26): qty 2

## 2015-06-08 MED ORDER — ONDANSETRON HCL 4 MG/2ML IJ SOLN: 4.0000 mg | Freq: Four times a day (QID) | INTRAMUSCULAR | Status: DC | PRN

## 2015-06-08 MED ORDER — ACETAMINOPHEN 325 MG PO TABS
650.0000 mg | ORAL_TABLET | Freq: Four times a day (QID) | ORAL | Status: DC | PRN
  Filled 2015-06-08: qty 2

## 2015-06-08 MED ORDER — METHYLPREDNISOLONE SODIUM SUCC 40 MG IJ SOLR
40.0000 mg | Freq: Four times a day (QID) | INTRAMUSCULAR | Status: DC
Start: 2015-06-08 — End: 2015-06-09
  Administered 2015-06-08 – 2015-06-09 (×6): 40 mg via INTRAVENOUS
  Filled 2015-06-08 (×6): qty 1

## 2015-06-08 MED ORDER — IPRATROPIUM-ALBUTEROL 0.5-2.5 (3) MG/3ML IN SOLN
3.0000 mL | Freq: Four times a day (QID) | RESPIRATORY_TRACT | Status: DC
  Administered 2015-06-08 – 2015-06-20 (×51): 3 mL via RESPIRATORY_TRACT
  Filled 2015-06-08 (×50): qty 3

## 2015-06-08 MED ORDER — DONEPEZIL HCL 5 MG PO TABS
5.0000 mg | ORAL_TABLET | Freq: Every day | ORAL | Status: DC
  Administered 2015-06-09 – 2015-06-16 (×8): 5 mg via ORAL
  Filled 2015-06-08 (×8): qty 1

## 2015-06-08 MED ORDER — ACETAMINOPHEN 650 MG RE SUPP
650.0000 mg | Freq: Four times a day (QID) | RECTAL | Status: DC | PRN
  Filled 2015-06-08: qty 1

## 2015-06-08 MED ORDER — POTASSIUM CHLORIDE IN NACL 20-0.45 MEQ/L-% IV SOLN
INTRAVENOUS | Status: AC
  Filled 2015-06-08: qty 1000

## 2015-06-08 MED ORDER — ONDANSETRON HCL 4 MG PO TABS: 4.0000 mg | ORAL_TABLET | Freq: Four times a day (QID) | ORAL | Status: DC | PRN

## 2015-06-08 MED ORDER — PIPERACILLIN-TAZOBACTAM 3.375 G IVPB 30 MIN
3.3750 g | Freq: Once | INTRAVENOUS | Status: AC
Start: 2015-06-08 — End: 2015-06-08
  Administered 2015-06-08: 3.375 g via INTRAVENOUS
  Filled 2015-06-08: qty 50

## 2015-06-08 NOTE — Progress Notes (Signed)
Pharmacy Antibiotic Note  Leah [name] is a 82 y.o. female admitted on 06/07/2015 with pneumonia.  Pharmacy has been consulted for zosyn dosing.  Plan: Zosyn 3.375 gm IV Q8H EI  Height: 5\' 6"  (167.6 cm) Weight: 117 lb 12.8 oz (53.434 kg) IBW/kg (Calculated) : 59.3  Temp (24hrs), Avg:99.1 F (37.3 C), Min:99 F (37.2 C), Max:99.1 F (37.3 C)   Recent Labs Lab 06/07/15 1132 06/07/15 1431 06/07/15 2304  WBC 42.1*  --   --   CREATININE 0.85  --  0.63  LATICACIDVEN 1.9 1.1  --     Estimated Creatinine Clearance: 47.3 mL/min (by C-G formula based on Cr of 0.63).    No Known Allergies  Thank you for allowing pharmacy to be a part of this patient's care.  Carola FrostNathan A Bryttney Netzer, Pharm.D., BCPS Clinical Pharmacist 06/08/2015 3:08 AM

## 2015-06-08 NOTE — ED Notes (Addendum)
Pt incontinent of urine. Changed, cleaned and turned to right.

## 2015-06-08 NOTE — ED Notes (Signed)
Pt was transferred from ED stretcher to hospital bed,   Her adult diaper was wet with urine so that was changed.   Pt was guarding lower abdomen and grimmacing like she is in pain.    Suprapubic area is very distended.    Bladder scan performed,    Greater than 1000 ml.    Spoke with Dr. Jonathon ResidesGoura and order was given for indwelling Foley catheter placement.

## 2015-06-08 NOTE — Progress Notes (Signed)
[facility] - [place] at [facility]   PATIENT NAME: Leah [name]    MR#:  [id number]  DATE OF BIRTH:  [birth year]  SUBJECTIVE:  CHIEF COMPLAINT:   Chief Complaint  Patient presents with  . Failure To Thrive  . Fall     Came with AMS, found to have pneumonia. Requiring Bipap and levophed drip.  REVIEW OF SYSTEMS:   On Bipap,  Drowsy.  ROS  DRUG ALLERGIES:  No Known Allergies  VITALS:  Blood pressure 111/58, pulse 66, temperature 98.1 F (36.7 C), temperature source Rectal, resp. rate 14, height  (1.676 m), weight 53.434 kg (117 lb 12.8 oz), SpO2 96 %.  PHYSICAL EXAMINATION:  GENERAL:  82 y.o.-year-old patient lying in the bed with critical appearance.  EYES: Pupils equal, round, reactive to light and accommodation. No scleral icterus. Extraocular muscles intact.  HEENT: Head atraumatic, normocephalic. Oropharynx and nasopharynx clear.  NECK:  Supple, no jugular venous distention. No thyroid enlargement, no tenderness.  LUNGS: Normal breath sounds bilaterally, no wheezing, some crepitation. No use of accessory muscles of respiration. On bipap CARDIOVASCULAR: S1, S2 normal. No murmurs, rubs, or gallops.  ABDOMEN: Soft, nontender, nondistended. Bowel sounds present. No organomegaly or mass.  EXTREMITIES: No pedal edema, cyanosis, or clubbing.  NEUROLOGIC: pt is drowsy, easily opens her eyes to touch and said " hi " to me. Moved her arms slowly. PSYCHIATRIC: The patient is drowsy.  SKIN: No obvious rash, lesion, or ulcer.   Physical Exam LABORATORY PANEL:   CBC  Recent Labs Lab 06/08/15 0435  WBC 32.4*  HGB 12.2  HCT 37.0  PLT 318   ------------------------------------------------------------------------------------------------------------------  Chemistries   Recent Labs Lab 06/07/15 1132  06/08/15 0435  NA 140  < > 139  K 2.8*  < > 3.0*  CL 101  < > 112*  CO2 29  < > 21*  GLUCOSE 139*  < > 166*  BUN 29*  < > 15   CREATININE 0.85  < > 0.59  CALCIUM 9.9  < > 7.8*  AST 34  --   --   ALT 14  --   --   ALKPHOS 99  --   --   BILITOT 0.7  --   --   < > = values in this interval not displayed. ------------------------------------------------------------------------------------------------------------------  Cardiac Enzymes  Recent Labs Lab 06/07/15 1132  TROPONINI 0.03   ------------------------------------------------------------------------------------------------------------------  RADIOLOGY:  Dg Lumbar Spine 2-3 Views  06/08/2015  CLINICAL DATA:  Acute lower back pain after fall 2 days ago. EXAM: LUMBAR SPINE - 2-3 VIEW COMPARISON:  None. FINDINGS: Diffuse osteopenia is noted. No fracture or significant spondylolisthesis is noted. Minimal degenerative disc disease is noted at L2-3 and L3-4. IMPRESSION: No acute abnormality seen in the lumbar spine. Electronically Signed   By: Lupita Raider, M.D.   On: 06/08/2015 18:53   Ct Head Wo Contrast  06/07/2015  CLINICAL DATA:  Altered mental status. EXAM: CT HEAD WITHOUT CONTRAST TECHNIQUE: Contiguous axial images were obtained from the base of the skull through the vertex without intravenous contrast. COMPARISON:  None. FINDINGS: Bony calvarium appears intact. Mild diffuse cortical atrophy is noted. Moderate chronic ischemic white matter disease is noted. No mass effect or midline shift is noted. Ventricular size is within normal limits. There is no evidence of mass lesion, hemorrhage or acute infarction. IMPRESSION: Mild diffuse cortical atrophy. Moderate chronic ischemic white matter disease. No acute intracranial abnormality seen. Electronically Signed   By: Fayrene Fearing  Christen Butter, M.D.   On: 06/07/2015 13:35   Ct Angio Chest Pe W/cm &/or Wo Cm  06/07/2015  CLINICAL DATA:  82 year old female with hypoxia, abnormal EKG, low grade fever. Initial encounter. EXAM: CT ANGIOGRAPHY CHEST WITH CONTRAST TECHNIQUE: Multidetector CT imaging of the chest was performed  using the standard protocol during bolus administration of intravenous contrast. Multiplanar CT image reconstructions and MIPs were obtained to evaluate the vascular anatomy. CONTRAST:  75 mL Isovue 370 COMPARISON:  Chest radiographs 1120 hours today and earlier. FINDINGS: Good contrast bolus timing in the pulmonary arterial tree. No focal filling defect identified in the pulmonary arterial tree to suggest the presence of acute pulmonary embolism. 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 The left vertebral artery arises directly from the arch and is patent. Other visualized proximal great vessels are patent. There is severe aortic atherosclerosis with calcified and bulky soft plaque or mural thrombus. Ulcerated plaque, most notable in the descending thoracic aorta (series 5, image 207). Intermittent retained secretions in the trachea. Complete opacification of the right lower lobe bronchus with subtotal consolidation of the right lower lobe. Air bronchograms. Trace right pleural effusion. In the aerated portion of the superior segment of the right lower lobe there is peribronchial and tree-in-bud nodularity. Superimposed centrilobular emphysema in both lungs. Mild streaky opacity in the posterior right middle lobe. Spiculated 10 mm left upper lobe lung nodule (coronal image 67 of series 9). Small benign appearing lingula subpleural nodule on series 6, image 105. No left pleural or pericardial effusion. Reactive appearing right hilar lymph nodes, individually up to 10 mm. No mediastinal lymphadenopathy. No axillary lymphadenopathy. Negative visualized liver, spleen, pancreas, adrenal glands, kidneys, and bowel in the upper abdomen. Mild chronic appearing inferior T10 endplate compression. Elsewhere the visible spine appears intact. Sternum appears intact. There is some respiratory motion artifact affecting detail of the ribs. However, the right posterior tenth  eleventh, and twelfth ribs are each fractured in at least 2 places. Moderate angulation of the tenth and eleventh rib fractures. Mildly displaced single fracture of the lateral ninth rib identified. See series 6. Review of the MIP images confirms the above findings. IMPRESSION: 1.  No evidence of acute pulmonary embolus. 2. Acute appearing comminuted and angulated fractures of the posterior right ninth through twelfth ribs with flail segments. 3. Subtotal right lower lobe consolidation. Opacified right mainstem bronchus, with multifocal retained secretions in the trachea. Reactive appearing right hilar lymphadenopathy. Top differential considerations include pneumonia following a large aspiration versus following right rib fractures. No significant pleural effusion. 4. Spiculated left upper lobe 10 mm lung nodule suspicious for small bronchogenic carcinoma. Consider one of the following in 3 months: (a) repeat chest CT, (b) follow-up PET-CT, or (c) tissue sampling. This recommendation follows the consensus statement: Guidelines for Management of Incidental Pulmonary Nodules Detected on CT Images:From the Fleischner Society 2017; published online before print (10.1148/radiol.6578469629). 5. Severe aortic atherosclerosis. Areas of ulcerated plaque most pronounced in the descending thoracic aorta. 6. 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 Electronically Signed   By: Odessa Fleming M.D.   On: 06/07/2015 13:43   Dg Chest Portable 1 View  06/07/2015  CLINICAL DATA:  Shortness of breath, dementia, history of Alzheimer's, cough, congestion EXAM: PORTABLE CHEST 1 VIEW COMPARISON:  02/10/2009 FINDINGS: Cardiomediastinal silhouette is stable. The patient is rotated to the right. Hazy right basilar atelectasis or an infiltrate. No pulmonary edema. Left lung is clear.  IMPRESSION: Hazy right basilar atelectasis or early infiltrate. No pulmonary edema. Left lung is clear. Electronically  Signed   By: Natasha MeadLiviu  Pop M.D.   On: 06/07/2015 11:35    ASSESSMENT AND PLAN:   Active Problems:   Sepsis (HCC)   Pneumonia   #1 acute respiratory failure with hypoxia-this is secondary to underlying COPD exacerbation, came with pneumonia. -Continue treatment for underlying COPD with IV steroids, DuoNeb's, Pulmicort nebs and also empiric antibiotics for pneumonia. -Continue O2 supplementation and will monitor. - on bipap.  #2 COPD exacerbation-due to pneumonia and ongoing tobacco abuse. - on IV steroids, DuoNeb nebs around-the-clock, Pulmicort nebs. - Continue IV Zosyn for aspiration pneumonia. She will need to be assessed for home oxygen prior to discharge.  #3 pneumonia-likely aspiration. -Continue IV Zosyn. Follow blood, sputum cultures. -We'll get speech therapy evaluation to assess swallowing.  #4 essential hypertension-patient's blood pressures on the low side given her respiratory failure presently.  -Hold antihypertensives.  #5 hypokalemia-will replace accordingly and repeat level in the morning. Check magnesium level.  #6 back pain-patient had a recent fall. I will get x-ray of her lumbar spine.  #7 spiculated lung nodule-this was noted on the CT chest. Patient did have a long history of tobacco abuse and high risk for malignancy. She would benefit from a PET scan as an outpatient. -Patient has no acute hemoptysis. Consider pulmonary consult if needed  #8. Leukocytosis-secondary to the pneumonia. Follow white cell count with IV antibiotic therapy.  #9 history of falls-the physical therapy evaluation to assess mobility.    All the records are reviewed and case discussed with Care Management/Social Workerr. Management plans discussed with the patient, family and they are in agreement.  CODE STATUS: Full.  TOTAL TIME TAKING CARE OF THIS PATIENT: 45 critical care minutes.   Spoke to her son on phone- [phone]  POSSIBLE D/C IN 3-4 DAYS, DEPENDING ON CLINICAL  CONDITION.   Altamese DillingVACHHANI, Yuvraj Pfeifer M.D on 06/08/2015   Between 7am to 6pm - Pager - [phone]  After 6pm go to [email or web address] - password [email or web address]  [facility] [place] [facility]  Office  [phone]  CC: Primary care physician; Jaclyn ShaggyATE,DENNY C, MD  Note: This dictation was prepared with Dragon dictation along with smaller phrase technology. Any transcriptional errors that result from this process are unintentional.

## 2015-06-08 NOTE — ED Notes (Signed)
Family in room at this time. Pt stable. Pt on Bipap, levophed, and 1/2 NS with 20 K. NO needs identified at this time

## 2015-06-09 DIAGNOSIS — J9601 Acute respiratory failure with hypoxia: Secondary | ICD-10-CM

## 2015-06-09 LAB — CBC
HCT: 32.8 % — ABNORMAL LOW (ref 35.0–47.0)
Hemoglobin: 10.9 g/dL — ABNORMAL LOW (ref 12.0–16.0)
MCH: 29 pg (ref 26.0–34.0)
MCHC: 33.2 g/dL (ref 32.0–36.0)
MCV: 87.5 fL (ref 80.0–100.0)
PLATELETS: 279 10*3/uL (ref 150–440)
RBC: 3.75 MIL/uL — ABNORMAL LOW (ref 3.80–5.20)
RDW: 13.7 % (ref 11.5–14.5)
WBC: 22.4 10*3/uL — ABNORMAL HIGH (ref 3.6–11.0)

## 2015-06-09 LAB — BASIC METABOLIC PANEL
Anion gap: 2 — ABNORMAL LOW (ref 5–15)
BUN: 14 mg/dL (ref 6–20)
CALCIUM: 8.3 mg/dL — AB (ref 8.9–10.3)
CO2: 27 mmol/L (ref 22–32)
CREATININE: 0.55 mg/dL (ref 0.44–1.00)
Chloride: 111 mmol/L (ref 101–111)
GFR calc Af Amer: >60 mL/min (ref >60)
Glucose, Bld: 124 mg/dL — ABNORMAL HIGH (ref 65–99)
Potassium: 3 mmol/L — ABNORMAL LOW (ref 3.5–5.1)
SODIUM: 140 mmol/L (ref 135–145)

## 2015-06-09 LAB — MAGNESIUM: MAGNESIUM: 1.4 mg/dL — AB (ref 1.7–2.4)

## 2015-06-09 LAB — MRSA PCR SCREENING: MRSA BY PCR: NEGATIVE

## 2015-06-09 LAB — PHOSPHORUS: Phosphorus: 2.4 mg/dL — ABNORMAL LOW (ref 2.5–4.6)

## 2015-06-09 LAB — PREALBUMIN: Prealbumin: 5.6 mg/dL — ABNORMAL LOW (ref 18–38)

## 2015-06-09 MED ORDER — NICOTINE 21 MG/24HR TD PT24
21.0000 mg | MEDICATED_PATCH | Freq: Every day | TRANSDERMAL | Status: DC
  Administered 2015-06-09 – 2015-06-20 (×12): 21 mg via TRANSDERMAL
  Filled 2015-06-09 (×12): qty 1

## 2015-06-09 MED ORDER — MAGNESIUM SULFATE 2 GM/50ML IV SOLN
2.0000 g | Freq: Once | INTRAVENOUS | Status: AC
  Administered 2015-06-09: 2 g via INTRAVENOUS
  Filled 2015-06-09: qty 50

## 2015-06-09 MED ORDER — POTASSIUM CHLORIDE 2 MEQ/ML IV SOLN
INTRAVENOUS | Status: DC
  Administered 2015-06-09 – 2015-06-10 (×2): via INTRAVENOUS
  Filled 2015-06-09 (×2): qty 1000

## 2015-06-09 MED ORDER — ENSURE ENLIVE PO LIQD
237.0000 mL | Freq: Three times a day (TID) | ORAL | Status: DC
  Administered 2015-06-09 – 2015-06-10 (×3): 237 mL via ORAL

## 2015-06-09 MED ORDER — MAGNESIUM SULFATE 4 GM/100ML IV SOLN
4.0000 g | Freq: Once | INTRAVENOUS | Status: AC
  Administered 2015-06-09: 4 g via INTRAVENOUS
  Filled 2015-06-09: qty 100

## 2015-06-09 MED ORDER — POTASSIUM CHLORIDE CRYS ER 20 MEQ PO TBCR
20.0000 meq | EXTENDED_RELEASE_TABLET | Freq: Two times a day (BID) | ORAL | Status: DC
  Administered 2015-06-09 – 2015-06-11 (×5): 20 meq via ORAL
  Filled 2015-06-09 (×6): qty 1

## 2015-06-09 MED ORDER — CETYLPYRIDINIUM CHLORIDE 0.05 % MT LIQD
7.0000 mL | Freq: Two times a day (BID) | OROMUCOSAL | Status: DC
  Administered 2015-06-09 – 2015-06-10 (×3): 7 mL via OROMUCOSAL

## 2015-06-09 MED ORDER — PNEUMOCOCCAL VAC POLYVALENT 25 MCG/0.5ML IJ INJ: 0.5000 mL | INJECTION | INTRAMUSCULAR | Status: DC | PRN

## 2015-06-09 MED ORDER — PANTOPRAZOLE SODIUM 40 MG IV SOLR
40.0000 mg | INTRAVENOUS | Status: DC
  Administered 2015-06-09: 40 mg via INTRAVENOUS
  Filled 2015-06-09: qty 40

## 2015-06-09 MED ORDER — POTASSIUM CHLORIDE 10 MEQ/50ML IV SOLN
10.0000 meq | INTRAVENOUS | Status: AC
  Administered 2015-06-09 (×6): 10 meq via INTRAVENOUS
  Filled 2015-06-09 (×6): qty 50

## 2015-06-09 MED ORDER — ENOXAPARIN SODIUM 40 MG/0.4ML ~~LOC~~ SOLN
40.0000 mg | SUBCUTANEOUS | Status: DC
  Administered 2015-06-10 – 2015-06-20 (×11): 40 mg via SUBCUTANEOUS
  Filled 2015-06-09 (×12): qty 0.4

## 2015-06-09 NOTE — Progress Notes (Signed)
[facility] - [place] at [facility]   PATIENT NAME: Leah [name]    MR#:  [id number]  DATE OF BIRTH:  [birth year]  SUBJECTIVE:  CHIEF COMPLAINT:   Chief Complaint  Patient presents with  . Failure To Thrive  . Fall     Came with AMS, found to have pneumonia. Requiring Bipap and levophed drip.   Now off levophed since last night and on nasal canula oxygen since this morning.  REVIEW OF SYSTEMS:   CONSTITUTIONAL: No fever, fatigue or weakness.  EYES: No blurred or double vision.  EARS, NOSE, AND THROAT: No tinnitus or ear pain.  RESPIRATORY: positive cough, shortness of breath, wheezing , no hemoptysis.  CARDIOVASCULAR: No chest pain, orthopnea, edema.  GASTROINTESTINAL: No nausea, vomiting, diarrhea or abdominal pain.  GENITOURINARY: No dysuria, hematuria.  ENDOCRINE: No polyuria, nocturia,  HEMATOLOGY: No anemia, easy bruising or bleeding SKIN: No rash or lesion. MUSCULOSKELETAL: No joint pain or arthritis.  NEUROLOGIC: No tingling, numbness, have generalized weakness.  PSYCHIATRY: No anxiety or depression.   ROS  DRUG ALLERGIES:  No Known Allergies  VITALS:  Blood pressure 144/69, pulse 97, temperature 97.6 F (36.4 C), temperature source Axillary, resp. rate 26, height 5\' 6"  (1.676 m), weight 53.434 kg (117 lb 12.8 oz), SpO2 88 %.  PHYSICAL EXAMINATION:  GENERAL:  82 y.o.-year-old patient lying in the bed with no acute distress.  EYES: Pupils equal, round, reactive to light and accommodation. No scleral icterus. Extraocular muscles intact.  HEENT: Head atraumatic, normocephalic. Oropharynx and nasopharynx clear.  NECK:  Supple, no jugular venous distention. No thyroid enlargement, no tenderness.  LUNGS: Normal breath sounds bilaterally, no wheezing, some crepitation. No use of accessory muscles of respiration. On nasal canula oxygen. CARDIOVASCULAR: S1, S2 normal. No murmurs, rubs, or gallops.  ABDOMEN: Soft, nontender,  nondistended. Bowel sounds present. No organomegaly or mass.  EXTREMITIES: No pedal edema, cyanosis, or clubbing.  NEUROLOGIC: pt is alert, moves all 4 limbs, follows simple commands, generalized weakness. Gait not checked. PSYCHIATRIC: The patient is alert and oriented.  SKIN: No obvious rash, lesion, or ulcer.   Physical Exam LABORATORY PANEL:   CBC  Recent Labs Lab 06/09/15 0500  WBC 22.4*  HGB 10.9*  HCT 32.8*  PLT 279   ------------------------------------------------------------------------------------------------------------------  Chemistries   Recent Labs Lab 06/08/15 1945 06/09/15 0500  NA 140 140  K 2.6* 3.0*  CL 120* 111  CO2 17* 27  GLUCOSE 135* 124*  BUN QUANTITY NOT SUFFICIENT, UNABLE TO PERFORM TEST 14  CREATININE QUANTITY NOT SUFFICIENT, UNABLE TO PERFORM TEST 0.55  CALCIUM 6.5* 8.3*  MG  --  1.4*  AST 15  --   ALT 7*  --   ALKPHOS 87  --   BILITOT 0.7  --    ------------------------------------------------------------------------------------------------------------------  Cardiac Enzymes  Recent Labs Lab 06/07/15 1132  TROPONINI 0.03   ------------------------------------------------------------------------------------------------------------------  RADIOLOGY:  Dg Lumbar Spine 2-3 Views  06/08/2015  CLINICAL DATA:  Acute lower back pain after fall 2 days ago. EXAM: LUMBAR SPINE - 2-3 VIEW COMPARISON:  None. FINDINGS: Diffuse osteopenia is noted. No fracture or significant spondylolisthesis is noted. Minimal degenerative disc disease is noted at L2-3 and L3-4. IMPRESSION: No acute abnormality seen in the lumbar spine. Electronically Signed   By: Lupita RaiderJames  Green Jr, M.D.   On: 06/08/2015 18:53    ASSESSMENT AND PLAN:   Active Problems:   Sepsis (HCC)   Pneumonia   #1 acute respiratory failure with hypoxia- septic shock on  presentation  this is secondary to underlying COPD exacerbation, came with pneumonia. -Continue treatment for  underlying COPD with IV steroids, DuoNeb's, Pulmicort nebs and also empiric antibiotics for pneumonia. -Continue O2 supplementation and will monitor. - was on bipap. Now on nasal canula oxygen. - appreciated pulm help. - was on levophed drip- shock resolved now.  #2 COPD exacerbation-due to pneumonia and ongoing tobacco abuse. - on IV steroids, DuoNeb nebs around-the-clock, Pulmicort nebs. - Continue IV Zosyn for aspiration pneumonia. She will need to be assessed for home oxygen prior to discharge.  MRSA PCR  negative.  #3 pneumonia-likely aspiration. -Continue IV Zosyn. Follow blood, sputum cultures. -We'll get speech therapy evaluation to assess swallowing.  #4 essential hypertension-patient's blood pressures on the low side given her respiratory failure presently.  -Hold antihypertensives.  #5 hypokalemia-will replace accordingly and repeat level in the morning. Check magnesium level.  #6 back pain-patient had a recent fall. I will get x-ray of her lumbar spine.  #7 spiculated lung nodule-this was noted on the CT chest. Patient did have a long history of tobacco abuse and high risk for malignancy. She would benefit from a PET scan as an outpatient. -Patient has no acute hemoptysis. Consider pulmonary consult if needed  #8. Leukocytosis-secondary to the pneumonia. Follow white cell count with IV antibiotic therapy.  #9 history of falls-the physical therapy evaluation to assess mobility.    All the records are reviewed and case discussed with Care Management/Social Workerr. Management plans discussed with the patient, family and they are in agreement.  CODE STATUS: Full.  TOTAL TIME TAKING CARE OF THIS PATIENT: 35 minutes.   POSSIBLE D/C IN 3-4 DAYS, DEPENDING ON CLINICAL CONDITION.  will transfer to floor today.  Altamese Dilling M.D on 06/09/2015   Between 7am to 6pm - Pager - [phone]  After 6pm go to [email or web address] - password [email or web address]  [facility]  Hospitalists  Office  [phone]  CC: Primary care physician; Jaclyn Shaggy, MD  Note: This dictation was prepared with Dragon dictation along with smaller phrase technology. Any transcriptional errors that result from this process are unintentional.

## 2015-06-09 NOTE — Clinical Social Work Note (Signed)
Clinical Social Work Assessment  Patient Details  Name: Leah [name] MRN: [id number] Date of Birth: 1932/08/04  Date of referral:  06/09/15               Reason for consult:  [facility], Facility Placement                Permission sought to share information with:  Facility Medical sales representativeContact Representative, Family Supports Permission granted to share information::  Yes, Verbal Permission Granted  Name::     Skip EstimableHerbert Jorden [phone] , daughter Rinaldo Cloudamela  Agency::  yes  Relationship::  yes  Contact Information:  yes  Housing/Transportation Living arrangements for the past 2 months:  Mobile Home Source of Information:  Adult Children Patient Interpreter Needed:  None Criminal Activity/Legal Involvement Pertinent to Current Situation/Hospitalization:  No - Comment as needed Significant Relationships:  Adult Children Lives with:    daughter Elita Quickam and grandson Anise Salvoony Do you feel safe going back to the place where you live?  No Need for family participation in patient care:  Yes (Comment)  Care giving concerns:  Patient presented in ICU malnourished and unkept- Son is taking over her care now and is considering a SNF   Office managerocial Worker assessment / plan:  LCSW completed assessment by phone with patients son, she does have medicare insurance and he will bring it to out meeting at 2pm. His mother needs assistance with bathing,dressing and eating. In meeting patient it was obvious she has alzheimers. Patient is able to walk and can feed herself somewhat but according to son is very hard to manage at times. Patient in October lost her other daughter, the daughter she lives with suffers with MI and was [facility]. LCSW will provide family with resources to ensure better care for his mother.    Employment status:  Retired Health and safety inspectornsurance information:  Medicare, Other (Comment Required) (TBD) PT Recommendations:  Skilled Nursing Facility Information / Referral to community  resources:  Skilled Nursing Facility  Patient/Family's Response to care:  Son concerned for his mothers care and is taking over  Patient/Family's Understanding of and Emotional Response to Diagnosis, Current Treatment, and Prognosis:  They have a good understanding of patients needs at this time and is considering a SNF placement  Emotional Assessment Appearance:  Appears stated age Attitude/Demeanor/Rapport:  Unable to Assess Affect (typically observed):  Overwhelmed, Restless Orientation:  Fluctuating Orientation (Suspected and/or reported Sundowners) (Patient has Alzeihmers) Alcohol / Substance use:  Never Used Psych involvement (Current and /or in the community):  No (Comment)  Discharge Needs  Concerns to be addressed:  Cognitive Concerns, Home Safety Concerns Readmission within the last 30 days:  No Current discharge risk:  Cognitively Impaired, Lack of support system Barriers to Discharge:  Unsafe home situation   Cheron SchaumannBandi, Lanna Labella M, LCSW 06/09/2015, 11:25 AM

## 2015-06-09 NOTE — Progress Notes (Signed)
eLink Physician-Brief Progress Note Patient Name: Leah [name] DOB: 1932-06-17 MRN: [id number]   Date of Service  06/09/2015  HPI/Events of Note  Hypokalemia and hypomag  eICU Interventions  Potassium and mag replaced     Intervention Category Intermediate Interventions: Electrolyte abnormality - evaluation and management  DETERDING,ELIZABETH 06/09/2015, 6:23 AM

## 2015-06-09 NOTE — Evaluation (Signed)
Physical Therapy Evaluation Patient Details Name: Leah I Swazil[place]Jordan MRN: [id number] DOB: October 12, 1932 Today's Date: 06/09/2015   History of Present Illness  Pt here with AMS, sepsis and respiratory distress.  She had been requiring a BiPAP and during PT eval was on 6 liters O2.   Clinical Impression  Pt with confusion, impulsivity and expressive issues t/o session, though she is generally able to follow simple instructions and does show good effort when she is able.  Pt was on 6 liters O2 t/o the session and did have a drop in sats from the mid 90s to the low 80s with activity, HR also increased as high as ~120 with activity.  Pt is weak and struggles with bed mobility but makes good effort with standing and some EOB side stepping.  Unsure of PLOF, but it appears rehab would be in the best interest for this pt on discharge.   Follow Up Recommendations SNF    Equipment Recommendations       Recommendations for Other Services       Precautions / Restrictions Precautions Precautions: Fall Restrictions Weight Bearing Restrictions: No      Mobility  Bed Mobility Overal bed mobility: Needs Assistance Bed Mobility: Supine to Sit;Sit to Supine     Supine to sit: Mod assist Sit to supine: Mod assist   General bed mobility comments: Pt shows good effort with getting in/out of bed but needs assist and much cuing.  Pt becomes fatigued with this minimal exertion.   Transfers Overall transfer level: Needs assistance Equipment used: Rolling walker (2 wheeled) Transfers: Sit to/from Stand Sit to Stand: Min assist         General transfer comment: Pt does relatively well rising from raised bed.  She has some unsteadiness and needs assist to maintain balance.  Her biggest issue was fatigue.  PulseOx was not consistently working but pt on 6 liters still became short of breath and sats appeared to drop into the low 80s and even hig 70s in standing.   Ambulation/Gait             General  Gait Details: Pt unable to truly ambulate though she did take a few side steps along the EOB with much fatigue and min assist.  Stairs            Wheelchair Mobility    Modified Rankin (Stroke Patients Only)       Balance                                             Pertinent Vitals/Pain Pain Assessment: No/denies pain    Home Living Family/patient expects to be discharged to:: Skilled nursing facility Living Arrangements: Children                    Prior Function Level of Independence: Needs assistance         Comments: Pt is unable to answer questions but note appears to indicate that she needs assist.     Hand Dominance        Extremity/Trunk Assessment   Upper Extremity Assessment: Generalized weakness (L UE appears minimally weaker)           Lower Extremity Assessment: Generalized weakness         Communication   Communication: Expressive difficulties (confused)  Cognition Arousal/Alertness: Awake/alert Behavior During Therapy: Impulsive Overall  Cognitive Status: History of cognitive impairments - at baseline                      General Comments      Exercises        Assessment/Plan    PT Assessment Patient needs continued PT services  PT Diagnosis Difficulty walking;Generalized weakness   PT Problem List Decreased strength;Decreased activity tolerance;Decreased range of motion;Decreased balance;Decreased mobility;Decreased coordination;Decreased cognition;Decreased knowledge of use of DME;Decreased safety awareness  PT Treatment Interventions DME instruction;Gait training;Functional mobility training;Therapeutic activities;Therapeutic exercise;Balance training;Neuromuscular re-education;Cognitive remediation;Patient/family education   PT Goals (Current goals can be found in the Care Plan section) Acute Rehab PT Goals Patient Stated Goal: unable PT Goal Formulation: Patient unable to participate in  goal setting Time For Goal Achievement: 06/23/15 Potential to Achieve Goals: Fair    Frequency Min 2X/week   Barriers to discharge        Co-evaluation               End of Session Equipment Utilized During Treatment: Gait belt;Oxygen (6 liters) Activity Tolerance: Patient limited by fatigue Patient left: with bed alarm set;with call bell/phone within reach;with nursing/sitter in room Nurse Communication: Mobility status (change in vitals with activity)         Time: 8119-1478 PT Time Calculation (min) (ACUTE ONLY): 21 min   Charges:   PT Evaluation $PT Eval Moderate Complexity: 1 Procedure     PT G Codes:       Loran Senters, PT, DPT [phone]  Malachi Pro 06/09/2015, 3:10 PM

## 2015-06-09 NOTE — NC FL2 (Signed)
[place] MEDICAID FL2 LEVEL OF CARE SCREENING TOOL     IDENTIFICATION  Patient Name: Shontelle I Swaz[place] Birthdate: Jan 05, 1933 Sex: female Admission Date (Current Location): 06/07/2015  [facility] [place] Number:      Facility and Address:        [facility] Provider Number:  [id number]  Attending Physician Name and Address:  Altamese Dilling, MD  Relative Name and Phone Number:       Current Level of Care: Hospital Recommended Level of Care: Skilled Nursing Facility Prior Approval Number:    Date Approved/Denied:   PASRR Number:    Discharge Plan:      Current Diagnoses: Patient Active Problem List   Diagnosis Date Noted  . Pneumonia 06/08/2015  . Sepsis (HCC) 06/07/2015    Orientation RESPIRATION BLADDER Height & Weight      (Alzheimers)  O2 Continent Weight: 117 lb 12.8 oz (53.434 kg) Height:   (167.6 cm)  BEHAVIORAL SYMPTOMS/MOOD NEUROLOGICAL BOWEL NUTRITION STATUS      Continent Diet (normal- high calorie)  AMBULATORY STATUS COMMUNICATION OF NEEDS Skin   Supervision Verbally Normal                       Personal Care Assistance Level of Assistance  Bathing, Feeding, Dressing, Total care Bathing Assistance: Maximum assistance Feeding assistance: Limited assistance Dressing Assistance: Maximum assistance Total Care Assistance: Maximum assistance   Functional Limitations Info  Sight, Hearing, Speech Sight Info: Adequate Hearing Info: Adequate Speech Info: Adequate (Alzheimers)    SPECIAL CARE FACTORS FREQUENCY                       Contractures Contractures Info: Not present    Additional Factors Info                  Current Medications (06/09/2015):  This is the current hospital active medication list Current Facility-Administered Medications  Medication Dose Route Frequency Provider Last Rate Last Dose  . acetaminophen (TYLENOL) tablet 650 mg  650 mg Oral Q6H PRN Houston Siren, MD       Or  . acetaminophen (TYLENOL) suppository 650 mg  650 mg Rectal Q6H PRN Houston Siren, MD      . budesonide (PULMICORT) nebulizer solution 0.25 mg  0.25 mg Nebulization BID Houston Siren, MD   0.25 mg at 06/09/15 0839  . dextrose 5% lactated ringers 1,000 mL with potassium chloride 40 mEq infusion   Intravenous Continuous Merwyn Katos, MD      . donepezil (ARICEPT) tablet 5 mg  5 mg Oral QHS Houston Siren, MD   5 mg at 06/08/15 2200  . ipratropium-albuterol (DUONEB) 0.5-2.5 (3) MG/3ML nebulizer solution 3 mL  3 mL Nebulization Q6H Houston Siren, MD   3 mL at 06/09/15 0839  . ondansetron (ZOFRAN) injection 4 mg  4 mg Intravenous Q6H PRN Houston Siren, MD      . piperacillin-tazobactam (ZOSYN) IVPB 3.375 g  3.375 g Intravenous 3 times per day Houston Siren, MD 12.5 mL/hr at 06/09/15 0356 3.375 g at 06/09/15 0356  . potassium chloride 10 mEq in 50 mL *CENTRAL LINE* IVPB  10 mEq Intravenous Q1 Hr x 6 Zigmund Gottron, MD   10 mEq at 06/09/15 1108     Discharge Medications: Please see discharge summary for a list of discharge medications.  Relevant Imaging Results:  Relevant Lab Results:   Additional Information SSN  [id number]  Cheron SchaumannBandi, Kaidynce Pfister M, LCSW

## 2015-06-09 NOTE — Progress Notes (Signed)
LCSW met with patient and consulted ICU nurse. In [place] patient has resided with daughter Jeannene Patella and her son Nicole Kindred. They were struggling to provide adequate care to patient. Called son Kennith Center who will come in at 2pm to assist with providing information and is considering to have his mother placed in a skilled nursing facility. LCSW will start assessment and Fl2 and put together a resource package for family.  Valeria Boza LCSW

## 2015-06-09 NOTE — Progress Notes (Signed)
Pharmacy Antibiotic Note  Leah [name] is a 82 y.o. female admitted on 06/07/2015 with pneumonia.  Pharmacy has been consulted for zosyn dosing.  Plan: Zosyn 3.375 gm IV Q8H EI  Height: 5\' 6"  (167.6 cm) Weight: 117 lb 12.8 oz (53.434 kg) IBW/kg (Calculated) : 59.3  Temp (24hrs), Avg:97.5 F (36.4 C), Min:97.4 F (36.3 C), Max:97.6 F (36.4 C)   Recent Labs Lab 06/07/15 1132 06/07/15 1431 06/07/15 2304 06/08/15 0435 06/08/15 1945 06/09/15 0500  WBC 42.1*  --   --  32.4* 25.8* 22.4*  CREATININE 0.85  --  0.63 0.59 QUANTITY NOT SUFFICIENT, UNABLE TO PERFORM TEST 0.55  LATICACIDVEN 1.9 1.1  --   --   --   --     Estimated Creatinine Clearance: 47.3 mL/min (by C-G formula based on Cr of 0.55).    No Known Allergies  Thank you for allowing pharmacy to be a part of this patient's care.  Chizuko Trine D, Pharm.D., BCPS Clinical Pharmacist 06/09/2015 11:03 AM

## 2015-06-09 NOTE — Consult Note (Signed)
PULMONARY / CRITICAL CARE MEDICINE   Name: Leah [name] MRN: [id number] DOB: [birth year]    ADMISSION DATE:  06/07/2015   CONSULTATION DATE:  04/01//2017  REFERRING MD: Dr. Elisabeth Pigeon  CHIEF COMPLAINT: Adult failure to thrive   HISTORY OF PRESENT ILLNESS:   This is an 82 year old Caucasian female with a past medical history of COPD, hypertension and dementia who presented from home via EMS for decreased oral intake, cough, congestion, and dyspnea. History is obtained from patient's chart, as she is currently confused and unable to provide any history. Patient lives at home with her daughter and grandson. Per ED records, EMS was called by patient's son. He indicated that patient has been refusing her medications as well as meals. Patient is disheveled and in poor hygienic condition. Upon EMS arrival, patient's oxygen saturation  was 86% on room air. She was placed on 2 L nasal cannula and transferred to the ED. At the ED her oxygen saturation was 91%. She was transitioned to 5 L nasal cannula and subsequently placed on continuous BiPAP. She was also noted to be tachycardic, and a CTA chest  PE study was done which was negative for PE but showed right lower lobe consolidation consistent with aspiration pneumonia as well as multiple rib fractures, and a left upper lobe lung nodule suspicious for bronchogenic carcinoma. Unclear if patient sustained any falls at home that could have resulted in rib fractures. Her CT head was negative. PCCM was consulted for further management. She was started on pressors in the ED. She is currently on 10 of levophed with MAPs between 60 and 70. Patient is a current smoker.   PAST MEDICAL HISTORY :  She  has a past medical history of Dementia; COPD (chronic obstructive pulmonary disease) (HCC); and Essential hypertension.  PAST SURGICAL HISTORY: She  has no past surgical history on file.  No Known Allergies  No current facility-administered medications on file  prior to encounter.   No current outpatient prescriptions on file prior to encounter.    FAMILY HISTORY:  Her indicated that her mother is deceased. She indicated that her father is deceased.   SOCIAL HISTORY: She  reports that she has been smoking Cigarettes.  She has a 60 pack-year smoking history. She has never used smokeless tobacco. She reports that she does not drink alcohol or use illicit drugs.  REVIEW OF SYSTEMS:   Unable to obtain due to patient's mental status  SUBJECTIVE:    VITAL SIGNS: BP 94/57 mmHg  Pulse 64  Temp(Src) 98.1 F (36.7 C) (Rectal)  Resp 17  Ht  (1.676 m)  Wt 117 lb 12.8 oz (53.434 kg)  BMI 19.02 kg/m2  SpO2 96%  HEMODYNAMICS:    VENTILATOR SETTINGS: Vent Mode:  [-]  FiO2 (%):  [40 %-50 %] 40 %  INTAKE / OUTPUT: I/O last 3 completed shifts: In: -  Out: 2100 [Urine:2100]  PHYSICAL EXAMINATION: General:  Cachectic and disheveled Neuro: Awakens to voice and touch, follows some basic commands, confused HEENT:  PERRLA, oral mucosa moist Cardiovascular: Rate and rhythm regular, S1, S2, no murmur, gallop or regurg Lungs:  Normal work of breathing, bilateral airflow, diminished in the bases, right greater than left, no wheezing Abdomen:  Soft, nontender, nondistended, normal bowel sounds Musculoskeletal:  No joint deformities, positive range of motion in all extremities Extremities: +2 pulses, no edema Skin: Poor turgor, warm and dry LABS:  BMET  Recent Labs Lab 06/07/15 2304 06/08/15 0435 06/08/15 1945  NA 134* 139  140  K 2.9* 3.0* 2.6*  CL 107 112* 120*  CO2 25 21* 17*  BUN 18 15 QUANTITY NOT SUFFICIENT, UNABLE TO PERFORM TEST  CREATININE 0.63 0.59 QUANTITY NOT SUFFICIENT, UNABLE TO PERFORM TEST  GLUCOSE 357* 166* 135*    Electrolytes  Recent Labs Lab 06/07/15 2304 06/08/15 0435 06/08/15 1945  CALCIUM 7.3* 7.8* 6.5*    CBC  Recent Labs Lab 06/07/15 1132 06/08/15 0435 06/08/15 1945  WBC 42.1* 32.4* 25.8*   HGB 15.3 12.2 11.8*  HCT 45.4 37.0 37.7  PLT 375 318 302    Coag's No results for input(s): APTT, INR in the last 168 hours.  Sepsis Markers  Recent Labs Lab 06/07/15 1132 06/07/15 1431  LATICACIDVEN 1.9 1.1    ABG No results for input(s): PHART, PCO2ART, PO2ART in the last 168 hours.  Liver Enzymes  Recent Labs Lab 06/07/15 1132 06/08/15 1945  AST 34 15  ALT 14 7*  ALKPHOS 99 87  BILITOT 0.7 0.7  ALBUMIN 3.4* 1.8*    Cardiac Enzymes  Recent Labs Lab 06/07/15 1132  TROPONINI 0.03    Glucose  Recent Labs Lab 06/08/15 2204  GLUCAP 156*    Imaging Dg Lumbar Spine 2-3 Views  06/08/2015  CLINICAL DATA:  Acute lower back pain after fall 2 days ago. EXAM: LUMBAR SPINE - 2-3 VIEW COMPARISON:  None. FINDINGS: Diffuse osteopenia is noted. No fracture or significant spondylolisthesis is noted. Minimal degenerative disc disease is noted at L2-3 and L3-4. IMPRESSION: No acute abnormality seen in the lumbar spine. Electronically Signed   By: Lupita RaiderJames  Green Jr, M.D.   On: 06/08/2015 18:53    STUDIES:  None  CULTURES: 0 3/30-2017 Blood cultures Urine culture MRSA screen negative  ANTIBIOTICS: Zosyn started 06/07/2015  SIGNIFICANT EVENTS: 0 3/30-2017: Admitted with acute respiratory failure right lower lobe  Pneumonia, sepsis secondary to pneumonia, protein calorie malnutrition, multiple rib fractures of unknown cause, and lung nodule  LINES/TUBES: Left femoral triple-lumen catheter placed on 06/07/2015  DISCUSSION: 82 year old female presenting with acute respiratory failure,  right lower lobe pneumonia, septic shock secondary to pneumonia, protein calorie malnutrition, multiple rib fractures of unknown cause, and lung nodule suspicious for malignancy  ASSESSMENT / PLAN:  PULMONARY A: Acute respiratory failure secondary to aspiration pneumonia and acute COPD exacerbation Multiple rib fractures. Lung nodule. Rule out malignancy. Acute COPD  exacerbation Right lower lobe pneumonia-aspiration versus bacterial P:   -Continuous BiPAP, titrate settings to keep SPO2 88-94%. -Titrate off BiPAP to nasal cannula last tolerated -Nebulized bronchodilators and inhaled steroids -IV steroids and taper. -Daily. Chest x-ray. -Empiric antibiotics  CARDIOVASCULAR A:  Septic shock History of hypertension P:  -Levophed, titrate to MAP> 65 -Hold all home antihypertensives in light of hypotension -Hemodynamics per ICU protocol  RENAL A:    severe hypokalemia P:   -Monitor and replace electrolytes -Monitor creatinine  GASTROINTESTINAL A:   Protein calorie malnutrition Dysphagia P:   -Keep nothing by mouth -PPI for stress ulcer prophylaxis -Swallow evaluation -Pre-albumin with a.m. Labs -IV fluids  HEMATOLOGIC A:   No acute issues P:  -Lovenox for VTE prophylaxis  INFECTIOUS A:   Aspiration pneumonia Sepsis secondary to pneumonia P:   -Empiric antibiotics. -Follow-up cultures  ENDOCRINE A:   No acute issues  P:   -Monitor blood glucose with BMP  NEUROLOGIC A:   History of Alzheimer's dementia P:   RASS goal:  -Supportive care. -Continue home medications   Disposition and family update:  patient is a full code.  Given her cachectic and disheveled status, will request  social services to consult with family regarding patient's care at home. She will need advanced care planning due to worsening Alzheimer's, poor oral intake and dysphagia.  Best Practice: Code Status:  Full. Diet: NPO GI prophylaxis:  PPI. VTE prophylaxis:  SCD's / Lovenox  Total PCCM time is 55 minutes  Magdalene S. [facility] ANP-BC Pulmonary and Critical Care Medicine [facility] Pager [phone]  06/09/2015, 12:48 AM  PCCM ATTENDING ATTESTATION:  I have evaluated patient with ANP Luci Bank, reviewed database in its entirety and discussed care plan in detail. In addition, this patient was discussed on multidisciplinary rounds.    NAD off BiPAP Very HOH Coarse BS throughout RRR No edema  Major problems addressed by PCCM team: Acute hypoxemic respiratory failure RLL PNA Septic shock - now resolve   PLAN/REC: Monitor in ICU/SDU Cont supp O2 Cont PRN BiPAP Cont abx   Billy Fischer, MD PCCM service Mobile [phone] Pager [phone]

## 2015-06-10 ENCOUNTER — Inpatient Hospital Stay: Payer: Medicare Other

## 2015-06-10 DIAGNOSIS — J189 Pneumonia, unspecified organism: Secondary | ICD-10-CM

## 2015-06-10 DIAGNOSIS — J441 Chronic obstructive pulmonary disease with (acute) exacerbation: Secondary | ICD-10-CM

## 2015-06-10 LAB — CBC
HEMATOCRIT: 35 % (ref 35.0–47.0)
HEMOGLOBIN: 11.3 g/dL — AB (ref 12.0–16.0)
MCH: 28.4 pg (ref 26.0–34.0)
MCHC: 32.4 g/dL (ref 32.0–36.0)
MCV: 87.6 fL (ref 80.0–100.0)
Platelets: 306 10*3/uL (ref 150–440)
RBC: 4 MIL/uL (ref 3.80–5.20)
RDW: 14 % (ref 11.5–14.5)
WBC: 24.1 10*3/uL — AB (ref 3.6–11.0)

## 2015-06-10 LAB — BASIC METABOLIC PANEL
Anion gap: 2 — ABNORMAL LOW (ref 5–15)
BUN: 20 mg/dL (ref 6–20)
CHLORIDE: 112 mmol/L — AB (ref 101–111)
CO2: 29 mmol/L (ref 22–32)
Calcium: 8.5 mg/dL — ABNORMAL LOW (ref 8.9–10.3)
Creatinine, Ser: 0.63 mg/dL (ref 0.44–1.00)
GFR calc Af Amer: >60 mL/min (ref >60)
GFR calc non Af Amer: >60 mL/min (ref >60)
GLUCOSE: 117 mg/dL — AB (ref 65–99)
POTASSIUM: 3.7 mmol/L (ref 3.5–5.1)
Sodium: 143 mmol/L (ref 135–145)

## 2015-06-10 LAB — PHOSPHORUS: PHOSPHORUS: 1.3 mg/dL — AB (ref 2.5–4.6)

## 2015-06-10 LAB — MAGNESIUM: MAGNESIUM: 2.2 mg/dL (ref 1.7–2.4)

## 2015-06-10 MED ORDER — HALOPERIDOL LACTATE 5 MG/ML IJ SOLN
2.0000 mg | Freq: Once | INTRAMUSCULAR | Status: AC
  Administered 2015-06-10: 2 mg via INTRAVENOUS
  Filled 2015-06-10: qty 1

## 2015-06-10 MED ORDER — SODIUM CHLORIDE 0.9% FLUSH
3.0000 mL | Freq: Two times a day (BID) | INTRAVENOUS | Status: DC
  Administered 2015-06-10 – 2015-06-18 (×16): 3 mL via INTRAVENOUS

## 2015-06-10 NOTE — Progress Notes (Signed)
Patient alert oriented to self. Foley removed, no void yet. @l  asal cannula with oxygen saturation WNL. Transfer to 116. [facility] Will transfer shortly.

## 2015-06-10 NOTE — Progress Notes (Signed)
Much improved. No distress on [place] O2  Filed Vitals:   06/10/15 0600 06/10/15 0700 06/10/15 0835 06/10/15 0952  BP: 133/114 119/64  112/84  Pulse: 90 64  114  Temp:    98.4 F (36.9 C)  TempSrc:    Oral  Resp: 23 15  28   Height:      Weight:      SpO2: 93% 95% 94% 88%   2 lpm [place]  NAD HEENT WNL Very coarse wheezes persist RRR s M NABS No edema  BMP Latest Ref Rng 06/10/2015 06/09/2015 06/08/2015  Glucose 65 - 99 mg/dL 960(A117(H) 540(J124(H) 811(B135(H)  BUN 6 - 20 mg/dL 20 14 QUANTITY NOT SUFFICIENT, UNABLE TO PERFORM TEST  Creatinine 0.44 - 1.00 mg/dL 1.470.63 8.290.55 QUANTITY NOT SUFFICIENT, UNABLE TO PERFORM TEST  Sodium 135 - 145 mmol/L 143 140 140  Potassium 3.5 - 5.1 mmol/L 3.7 3.0(L) 2.6(LL)  Chloride 101 - 111 mmol/L 112(H) 111 120(H)  CO2 22 - 32 mmol/L 29 27 17(L)  Calcium 8.9 - 10.3 mg/dL 5.6(O8.5(L) 8.3(L) 6.5(L)    CBC Latest Ref Rng 06/10/2015 06/09/2015 06/08/2015  WBC 3.6 - 11.0 K/uL 24.1(H) 22.4(H) 25.8(H)  Hemoglobin 12.0 - 16.0 g/dL 11.3(L) 10.9(L) 11.8(L)  Hematocrit 35.0 - 47.0 % 35.0 32.8(L) 37.7  Platelets 150 - 440 K/uL 306 279 302    CXR: increased RLL opacity  IMPRESSION: Acute hypoxemic respiratory failure COPD with acute bronchospasm RLL PNA, NOS Shock resolved  PLAN/REC: Agree with transfer to med-surg Continue supplemental O2 to maintain SpO2 > 90% Cont empiric abx - complete 7-10 days Follow CXR intermittently Will need f/u CXR in 3-4 weeks after discharge to ensure complete resolution of RLL infiltrate Continue nebulized steroids and bronchodilators When wheezing resolves, would initiate long term bronchodilator regimen such as Symibcort or Dulera or equivalent  PCCM will cont to follow  Billy Fischeravid Simonds, MD PCCM service Mobile [phone] Pager [phone] 06/10/2015

## 2015-06-10 NOTE — Plan of Care (Signed)
Problem: Safety: Goal: Ability to remain free from injury will improve Outcome: Progressing Pt. Needing frequent redirection to stay in bed  Problem: Health Behavior/Discharge Planning: Goal: Ability to manage health-related needs will improve Pt has dementia so pt has a hard time remembering to keep her nasal cannula on. Pt is reminded of the benefit and reason to have nasal cannula in place. Per pt's family pt does not like taking her medications at home. Pt does not have any PO meds at this time. Social work consult in place and [name]. Miles,Christina E 4:07 PM 06/09/2015  Outcome: Not Progressing Pt. Has baseline dementia and poor safety awareness at baseline  Problem: Pain Managment: Goal: General experience of comfort will improve Outcome: Not Progressing Pt. Denied pain O/N and rested intermittently  Problem: Physical Regulation: Goal: Ability to maintain clinical measurements within normal limits will improve Outcome: Progressing VSS  Problem: Activity: Goal: Risk for activity intolerance will decrease Outcome: Not Progressing Pt. Still has generalized weakness     Problem: Nutrition: Goal: Adequate nutrition will be maintained Outcome: Progressing PT. Getting a cardiac diet  Problem: Respiratory: Goal: Levels of oxygenation will improve Outcome: Progressing Pt. Titrated to 3L [place] O/N

## 2015-06-10 NOTE — Progress Notes (Signed)
[facility] - [place] at [facility]   PATIENT NAME: Leah [name]    MR#:  [id number]  DATE OF BIRTH:  May 07, 1932  SUBJECTIVE:  CHIEF COMPLAINT:   Chief Complaint  Patient presents with  . Failure To Thrive  . Fall     Came with AMS, found to have pneumonia. Requiring Bipap and levophed drip.   Now off levophed and on nasal canula oxygen.    Alert now, and feels better. Have baseline dementia.  REVIEW OF SYSTEMS:   CONSTITUTIONAL: No fever, fatigue or weakness.  EYES: No blurred or double vision.  EARS, NOSE, AND THROAT: No tinnitus or ear pain.  RESPIRATORY: positive cough, shortness of breath, wheezing , no hemoptysis.  CARDIOVASCULAR: No chest pain, orthopnea, edema.  GASTROINTESTINAL: No nausea, vomiting, diarrhea or abdominal pain.  GENITOURINARY: No dysuria, hematuria.  ENDOCRINE: No polyuria, nocturia,  HEMATOLOGY: No anemia, easy bruising or bleeding SKIN: No rash or lesion. MUSCULOSKELETAL: No joint pain or arthritis.  NEUROLOGIC: No tingling, numbness, have generalized weakness.  PSYCHIATRY: No anxiety or depression.   ROS  DRUG ALLERGIES:  No Known Allergies  VITALS:  Blood pressure 112/84, pulse 114, temperature 98.4 F (36.9 C), temperature source Oral, resp. rate 28, height  (1.676 m), weight 53.434 kg (117 lb 12.8 oz), SpO2 88 %.  PHYSICAL EXAMINATION:  GENERAL:  82 y.o.-year-old patient lying in the bed with no acute distress.  EYES: Pupils equal, round, reactive to light and accommodation. No scleral icterus. Extraocular muscles intact.  HEENT: Head atraumatic, normocephalic. Oropharynx and nasopharynx clear.  NECK:  Supple, no jugular venous distention. No thyroid enlargement, no tenderness.  LUNGS: Normal breath sounds bilaterally, no wheezing, some crepitation. No use of accessory muscles of respiration. On nasal canula oxygen. CARDIOVASCULAR: S1, S2 normal. No murmurs, rubs, or gallops.  ABDOMEN: Soft,  nontender, nondistended. Bowel sounds present. No organomegaly or mass.  EXTREMITIES: No pedal edema, cyanosis, or clubbing.  NEUROLOGIC: pt is alert, moves all 4 limbs, follows simple commands, generalized weakness. Gait not checked. PSYCHIATRIC: The patient is alert and oriented.  SKIN: No obvious rash, lesion, or ulcer.   Physical Exam LABORATORY PANEL:   CBC  Recent Labs Lab 06/10/15 0500  WBC 24.1*  HGB 11.3*  HCT 35.0  PLT 306   ------------------------------------------------------------------------------------------------------------------  Chemistries   Recent Labs Lab 06/08/15 1945  06/10/15 0500 06/10/15 0501  NA 140  < > 143  --   K 2.6*  < > 3.7  --   CL 120*  < > 112*  --   CO2 17*  < > 29  --   GLUCOSE 135*  < > 117*  --   BUN QUANTITY NOT SUFFICIENT, UNABLE TO PERFORM TEST  < > 20  --   CREATININE QUANTITY NOT SUFFICIENT, UNABLE TO PERFORM TEST  < > 0.63  --   CALCIUM 6.5*  < > 8.5*  --   MG  --   < >  --  2.2  AST 15  --   --   --   ALT 7*  --   --   --   ALKPHOS 87  --   --   --   BILITOT 0.7  --   --   --   < > = values in this interval not displayed. ------------------------------------------------------------------------------------------------------------------  Cardiac Enzymes  Recent Labs Lab 06/07/15 1132  TROPONINI 0.03   ------------------------------------------------------------------------------------------------------------------  RADIOLOGY:  Dg Lumbar Spine 2-3 Views  06/08/2015  CLINICAL  DATA:  Acute lower back pain after fall 2 days ago. EXAM: LUMBAR SPINE - 2-3 VIEW COMPARISON:  None. FINDINGS: Diffuse osteopenia is noted. No fracture or significant spondylolisthesis is noted. Minimal degenerative disc disease is noted at L2-3 and L3-4. IMPRESSION: No acute abnormality seen in the lumbar spine. Electronically Signed   By: Lupita RaiderJames  Green Jr, M.D.   On: 06/08/2015 18:53   Dg Chest Port 1 View  06/10/2015  CLINICAL DATA:   Respiratory distress and respiratory failure. EXAM: PORTABLE CHEST 1 VIEW COMPARISON:  06/07/2015 CT and chest radiograph FINDINGS: Increasing right lower lung consolidation/atelectasis noted. Cardiomegaly with mild pulmonary vascular congestion and possible mild interstitial edema noted. There is no evidence of pneumothorax. No other changes identified. IMPRESSION: Increasing right lower lung consolidation/atelectasis. Mild pulmonary vascular congestion with question of new mild interstitial edema. Electronically Signed   By: Harmon PierJeffrey  Hu M.D.   On: 06/10/2015 07:51    ASSESSMENT AND PLAN:   Active Problems:   Sepsis (HCC)   Pneumonia   #1 acute respiratory failure with hypoxia- septic shock on presentation  this is secondary to underlying COPD exacerbation, came with pneumonia. -Continue treatment for underlying COPD with IV steroids, DuoNeb's, Pulmicort nebs and also empiric antibiotics for pneumonia. -Continue O2 supplementation and will monitor. - was on bipap. Now on nasal canula oxygen. - appreciated pulm help. - was on levophed drip- shock resolved now.  #2 COPD exacerbation-due to pneumonia and ongoing tobacco abuse. - on IV steroids, DuoNeb nebs around-the-clock, Pulmicort nebs. - Continue IV Zosyn for aspiration pneumonia. She will need to be assessed for home oxygen prior to discharge.  MRSA PCR  Negative. - will switch to oral tomorrow.  #3 pneumonia-likely aspiration. -Continue IV Zosyn. Follow blood, sputum cultures. -We'll get speech therapy evaluation to assess swallowing.  #4 essential hypertension-patient's blood pressures on the low side given her respiratory failure presently.  -Hold antihypertensives.  #5 hypokalemia-will replace accordingly and repeat level in the morning. Check magnesium level.  #6 back pain-patient had a recent fall. No fractures on lumber spine xray.   She have fracture 9th to 12 th ribs on right side posterior region.  #7 spiculated lung  nodule-this was noted on the CT chest. Patient did have a long history of tobacco abuse and high risk for malignancy. She would benefit from a PET scan as an outpatient. -Patient has no acute hemoptysis. Consider pulmonary consult if needed  #8. Leukocytosis-secondary to the pneumonia. Follow white cell count with IV antibiotic therapy.  #9 history of falls-the physical therapy evaluation to assess mobility.   All the records are reviewed and case discussed with Care Management/Social Workerr. Management plans discussed with the patient, family and they are in agreement.  CODE STATUS: Full.  TOTAL TIME TAKING CARE OF THIS PATIENT: 35 minutes.   POSSIBLE D/C IN 3-4 DAYS, DEPENDING ON CLINICAL CONDITION. I spoke to her son on phone.  Altamese DillingVACHHANI, Chadric Kimberley M.D on 06/10/2015   Between 7am to 6pm - Pager - [phone]  After 6pm go to [email or web address] - password [email or web address]  Fabio Neighborsagle [facility]  Office  [phone]  CC: Primary care physician; Jaclyn ShaggyATE,DENNY C, MD  Note: This dictation was prepared with Dragon dictation along with smaller phrase technology. Any transcriptional errors that result from this process are unintentional.

## 2015-06-11 ENCOUNTER — Inpatient Hospital Stay: Payer: Medicare Other

## 2015-06-11 DIAGNOSIS — J9600 Acute respiratory failure, unspecified whether with hypoxia or hypercapnia: Secondary | ICD-10-CM | POA: Insufficient documentation

## 2015-06-11 DIAGNOSIS — A419 Sepsis, unspecified organism: Principal | ICD-10-CM

## 2015-06-11 LAB — BLOOD GAS, ARTERIAL
ACID-BASE EXCESS: 4.1 mmol/L — AB (ref 0.0–3.0)
ALLENS TEST (PASS/FAIL): POSITIVE — AB
Acid-Base Excess: 6.6 mmol/L — ABNORMAL HIGH (ref 0.0–3.0)
Allens test (pass/fail): POSITIVE — AB
BICARBONATE: 32.8 meq/L — AB (ref 21.0–28.0)
Bicarbonate: 32.2 mEq/L — ABNORMAL HIGH (ref 21.0–28.0)
DELIVERY SYSTEMS: POSITIVE
Expiratory PAP: 6
FIO2: 0.8
FIO2: 0.4
Inspiratory PAP: 14
LHR: 8 {breaths}/min
MECHVT: 400 mL
O2 Saturation: 92.4 %
O2 Saturation: 93.3 %
PATIENT TEMPERATURE: 37
PEEP/CPAP: 5 cmH2O
PO2 ART: 68 mmHg — AB (ref 83.0–108.0)
Patient temperature: 37
RATE: 20 resp/min
pCO2 arterial: 64 mmHg — ABNORMAL HIGH (ref 32.0–48.0)
pCO2 arterial: 53 mmHg — ABNORMAL HIGH (ref 32.0–48.0)
pH, Arterial: 7.31 — ABNORMAL LOW (ref 7.350–7.450)
pH, Arterial: 7.4 (ref 7.350–7.450)
pO2, Arterial: 71 mmHg — ABNORMAL LOW (ref 83.0–108.0)

## 2015-06-11 LAB — CBC
HEMATOCRIT: 37 % (ref 35.0–47.0)
Hemoglobin: 12.1 g/dL (ref 12.0–16.0)
MCH: 28.6 pg (ref 26.0–34.0)
MCHC: 32.8 g/dL (ref 32.0–36.0)
MCV: 87.1 fL (ref 80.0–100.0)
PLATELETS: 304 10*3/uL (ref 150–440)
RBC: 4.24 MIL/uL (ref 3.80–5.20)
RDW: 14.1 % (ref 11.5–14.5)
WBC: 18.2 10*3/uL — AB (ref 3.6–11.0)

## 2015-06-11 LAB — INFLUENZA PANEL BY PCR (TYPE A & B)
H1N1FLUPCR: NOT DETECTED
INFLAPCR: NEGATIVE
INFLBPCR: NEGATIVE

## 2015-06-11 LAB — MAGNESIUM: MAGNESIUM: 1.9 mg/dL (ref 1.7–2.4)

## 2015-06-11 LAB — POTASSIUM: Potassium: 4.3 mmol/L (ref 3.5–5.1)

## 2015-06-11 LAB — TROPONIN I
TROPONIN I: 0.65 ng/mL — AB (ref <0.031)
TROPONIN I: 0.52 ng/mL — AB (ref <0.031)

## 2015-06-11 LAB — GLUCOSE, CAPILLARY
GLUCOSE-CAPILLARY: 165 mg/dL — AB (ref 65–99)
Glucose-Capillary: 180 mg/dL — ABNORMAL HIGH (ref 65–99)

## 2015-06-11 LAB — PHOSPHORUS: Phosphorus: 3.4 mg/dL (ref 2.5–4.6)

## 2015-06-11 MED ORDER — FREE WATER
200.0000 mL | Freq: Three times a day (TID) | Status: DC
  Administered 2015-06-11 – 2015-06-17 (×18): 200 mL

## 2015-06-11 MED ORDER — CHLORHEXIDINE GLUCONATE 0.12% ORAL RINSE (MEDLINE KIT)
15.0000 mL | Freq: Two times a day (BID) | OROMUCOSAL | Status: DC
  Administered 2015-06-11 – 2015-06-17 (×12): 15 mL via OROMUCOSAL
  Filled 2015-06-11 (×14): qty 15

## 2015-06-11 MED ORDER — POTASSIUM PHOSPHATES 15 MMOLE/5ML IV SOLN
30.0000 mmol | Freq: Once | INTRAVENOUS | Status: AC
  Administered 2015-06-11: 30 mmol via INTRAVENOUS
  Filled 2015-06-11: qty 10

## 2015-06-11 MED ORDER — VECURONIUM BROMIDE 10 MG IV SOLR
INTRAVENOUS | Status: AC
  Administered 2015-06-11: 10 mg via INTRAVENOUS
  Filled 2015-06-11: qty 10

## 2015-06-11 MED ORDER — LORAZEPAM 2 MG/ML IJ SOLN
INTRAMUSCULAR | Status: AC
Start: 2015-06-11 — End: 2015-06-11
  Administered 2015-06-11: 2 mg
  Filled 2015-06-11: qty 1

## 2015-06-11 MED ORDER — VECURONIUM BROMIDE 10 MG IV SOLR
10.0000 mg | Freq: Once | INTRAVENOUS | Status: AC
  Administered 2015-06-11: 10 mg via INTRAVENOUS

## 2015-06-11 MED ORDER — NOREPINEPHRINE 4 MG/250ML-% IV SOLN
0.0000 ug/min | INTRAVENOUS | Status: DC
  Administered 2015-06-11: 5 ug/min via INTRAVENOUS
  Administered 2015-06-12: 10 ug/min via INTRAVENOUS
  Administered 2015-06-13: 16 ug/min via INTRAVENOUS
  Administered 2015-06-13 – 2015-06-14 (×4): 10 ug/min via INTRAVENOUS
  Administered 2015-06-15: 6 ug/min via INTRAVENOUS
  Administered 2015-06-15 – 2015-06-16 (×2): 10 ug/min via INTRAVENOUS
  Administered 2015-06-16: 7 ug/min via INTRAVENOUS
  Administered 2015-06-17: 5 ug/min via INTRAVENOUS
  Filled 2015-06-11 (×17): qty 250

## 2015-06-11 MED ORDER — PANTOPRAZOLE SODIUM 40 MG PO PACK
40.0000 mg | PACK | Freq: Every day | ORAL | Status: DC
  Administered 2015-06-12 – 2015-06-18 (×6): 40 mg
  Filled 2015-06-11 (×6): qty 20

## 2015-06-11 MED ORDER — MIDAZOLAM HCL 2 MG/2ML IJ SOLN
2.0000 mg | Freq: Once | INTRAMUSCULAR | Status: AC
  Administered 2015-06-11: 2 mg via INTRAVENOUS

## 2015-06-11 MED ORDER — NOREPINEPHRINE BITARTRATE 1 MG/ML IV SOLN: 0.0000 ug/min | INTRAVENOUS | Status: DC

## 2015-06-11 MED ORDER — FENTANYL CITRATE (PF) 100 MCG/2ML IJ SOLN
INTRAMUSCULAR | Status: AC
  Administered 2015-06-11: 50 ug via INTRAVENOUS
  Filled 2015-06-11: qty 4

## 2015-06-11 MED ORDER — MIDAZOLAM HCL 2 MG/2ML IJ SOLN
INTRAMUSCULAR | Status: AC
  Administered 2015-06-11: 2 mg via INTRAVENOUS
  Filled 2015-06-11: qty 4

## 2015-06-11 MED ORDER — FENTANYL CITRATE (PF) 100 MCG/2ML IJ SOLN
50.0000 ug | Freq: Once | INTRAMUSCULAR | Status: AC
  Administered 2015-06-11: 50 ug via INTRAVENOUS

## 2015-06-11 MED ORDER — ANTISEPTIC ORAL RINSE SOLUTION (CORINZ)
7.0000 mL | OROMUCOSAL | Status: DC
  Administered 2015-06-11 – 2015-06-17 (×60): 7 mL via OROMUCOSAL
  Filled 2015-06-11 (×67): qty 7

## 2015-06-11 MED ORDER — VITAL HIGH PROTEIN PO LIQD
1000.0000 mL | ORAL | Status: DC
  Administered 2015-06-11 – 2015-06-14 (×3): 1000 mL

## 2015-06-11 MED ORDER — STERILE WATER FOR INJECTION IJ SOLN
INTRAMUSCULAR | Status: AC
  Administered 2015-06-11: 12:00:00
  Filled 2015-06-11: qty 10

## 2015-06-11 MED ORDER — LORAZEPAM 2 MG/ML IJ SOLN
2.0000 mg | INTRAMUSCULAR | Status: DC | PRN
  Administered 2015-06-12 – 2015-06-14 (×6): 2 mg via INTRAVENOUS
  Administered 2015-06-15: 1 mg via INTRAVENOUS
  Administered 2015-06-19: 2 mg via INTRAVENOUS
  Filled 2015-06-11 (×8): qty 1

## 2015-06-11 MED ORDER — FENTANYL 2500MCG IN NS 250ML (10MCG/ML) PREMIX INFUSION
40.0000 ug/h | INTRAVENOUS | Status: DC
  Administered 2015-06-11: 40 ug/h via INTRAVENOUS
  Filled 2015-06-11 (×3): qty 250

## 2015-06-11 NOTE — Consult Note (Signed)
MEDICATION RELATED CONSULT NOTE - INITIAL   Pharmacy Consult for Electrolyte Management Indication: Hypophosphatemia, hypokalemia  No Known Allergies  Patient Measurements: Height: 5' 6"  (167.6 cm) Weight: 124 lb 5.4 oz (56.4 kg) IBW/kg (Calculated) : 59.3  Vital Signs: Temp: 97.8 F (36.6 C) (04/03 1300) Temp Source: Oral (04/03 1300) BP: 104/81 mmHg (04/03 1800) Pulse Rate: 85 (04/03 1800) Intake/Output from previous day: 04/02 0701 - 04/03 0700 In: 215.7 [I.V.:175.7; IV Piggyback:40] Out: 425 [Urine:425] Intake/Output from this shift: Total I/O In: 987.1 [I.V.:101.1; NG/GT:226; IV Piggyback:660] Out: -   Labs:  Recent Labs  06/08/15 1945 06/09/15 0500 06/10/15 0500 06/10/15 0501 06/11/15 0419 06/11/15 1712  WBC 25.8* 22.4* 24.1*  --  18.2*  --   HGB 11.8* 10.9* 11.3*  --  12.1  --   HCT 37.7 32.8* 35.0  --  37.0  --   PLT 302 279 306  --  304  --   CREATININE QUANTITY NOT SUFFICIENT, UNABLE TO PERFORM TEST 0.55 0.63  --   --   --   MG  --  1.4*  --  2.2  --  1.9  PHOS  --  2.4*  --  1.3*  --  3.4  ALBUMIN 1.8*  --   --   --   --   --   PROT 4.4*  --   --   --   --   --   AST 15  --   --   --   --   --   ALT 7*  --   --   --   --   --   ALKPHOS 87  --   --   --   --   --   BILITOT 0.7  --   --   --   --   --    Estimated Creatinine Clearance: 49.9 mL/min (by C-G formula based on Cr of 0.63).   Microbiology: Recent Results (from the past 720 hour(s))  Culture, blood (routine x 2)     Status: None (Preliminary result)   Collection Time: 06/07/15 11:32 AM  Result Value Ref Range Status   Specimen Description BLOOD LEFT ASSIST CONTROL  Final   Special Requests   Final    BOTTLES DRAWN AEROBIC AND ANAEROBIC [place]   Culture NO GROWTH 3 DAYS  Final   Report Status PENDING  Incomplete  MRSA PCR Screening     Status: None   Collection Time: 06/08/15  1:00 AM  Result Value Ref Range Status   MRSA by PCR NEGATIVE NEGATIVE Final    Comment:         The GeneXpert MRSA Assay (FDA approved for NASAL specimens only), is one component of a comprehensive MRSA colonization surveillance program. It is not intended to diagnose MRSA infection nor to guide or monitor treatment for MRSA infections.     Medical History: Past Medical History  Diagnosis Date  . Dementia   . COPD (chronic obstructive pulmonary disease) ([place])   . Essential hypertension     Medications:  Scheduled:  . antiseptic oral rinse  7 mL Mouth Rinse 10 times per day  . budesonide (PULMICORT) nebulizer solution  0.25 mg Nebulization BID  . chlorhexidine gluconate (SAGE KIT)  15 mL Mouth Rinse BID  . donepezil  5 mg Oral QHS  . enoxaparin (LOVENOX) injection  40 mg Subcutaneous Q24H  . free water  200 mL Per Tube 3 times per day  . ipratropium-albuterol  3 mL Nebulization Q6H  . nicotine  21 mg Transdermal Daily  . piperacillin-tazobactam (ZOSYN)  IV  3.375 g Intravenous 3 times per day  . potassium chloride  20 mEq Oral BID  . potassium phosphate IVPB (mmol)  30 mmol Intravenous Once  . sodium chloride flush  3 mL Intravenous Q12H    Assessment: Leah [name] is an 82 yo female presenting with AMS, fall, and found pneumonia. Pharmacy consulted to manage electrolytes in this patient. 4/2 K 3.5, Mag 2.2, Phos 1.3 4/3 k=4.3 mag=1.9 phos= 3.4  Plan:  Electrolytes are WNL. Will recheck in the AM.   Ramond Dial, Pharm.D Clinical Pharmacist   06/11/2015,6:51 PM

## 2015-06-11 NOTE — Progress Notes (Signed)
[facility] - [place] at [facility]   PATIENT NAME: Leah [name]    MR#:  [id number]  DATE OF BIRTH:  [birth year]  SUBJECTIVE:  CHIEF COMPLAINT:   Chief Complaint  Patient presents with  . Failure To Thrive  . Fall     Came with AMS, found to have pneumonia. Requiring Bipap and levophed drip.   later off levophed and on nasal canula oxygen. So transferred to floor on 06/10/15.    Again on 06/11/15 morning- was more hypoxic, requiring NRBM and bipap- so transferred to ICU.     I saw her around 10;30 am- and she is drowsy on bipap.   REVIEW OF SYSTEMS:    Review of Systems  Unable to perform ROS: mental acuity    DRUG ALLERGIES:  No Known Allergies  VITALS:  Blood pressure 125/95, pulse 107, temperature 97.5 F (36.4 C), temperature source Axillary, resp. rate 32, height  (1.676 m), weight 56.4 kg (124 lb 5.4 oz), SpO2 96 %.  PHYSICAL EXAMINATION:  GENERAL:  82 y.o.-year-old patient lying in the bed , critical appearing.  EYES: Pupils equal, round, reactive to light . No scleral icterus. Extraocular muscles intact.  HEENT: Head atraumatic, normocephalic. Oropharynx and nasopharynx clear.  NECK:  Supple, no jugular venous distention. No thyroid enlargement, no tenderness.  LUNGS: Normal breath sounds bilaterally, no wheezing, some crepitation. Positive use of accessory muscles of respiration. On bipap, shallow breaths. CARDIOVASCULAR: S1, S2 normal. No murmurs, rubs, or gallops.  ABDOMEN: Soft, nontender, nondistended. Bowel sounds present. No organomegaly or mass.  EXTREMITIES: No pedal edema, cyanosis, or clubbing.  NEUROLOGIC: pt is drowsy, opens eyes to touch, but goes back to sleep, in distress due to respi failure. PSYCHIATRIC: The patient drowsy.  SKIN: No obvious rash, lesion, or ulcer.   Physical Exam LABORATORY PANEL:   CBC  Recent Labs Lab 06/11/15 0419  WBC 18.2*  HGB 12.1  HCT 37.0  PLT 304    ------------------------------------------------------------------------------------------------------------------  Chemistries   Recent Labs Lab 06/08/15 1945  06/10/15 0500 06/10/15 0501  NA 140  < > 143  --   K 2.6*  < > 3.7  --   CL 120*  < > 112*  --   CO2 17*  < > 29  --   GLUCOSE 135*  < > 117*  --   BUN QUANTITY NOT SUFFICIENT, UNABLE TO PERFORM TEST  < > 20  --   CREATININE QUANTITY NOT SUFFICIENT, UNABLE TO PERFORM TEST  < > 0.63  --   CALCIUM 6.5*  < > 8.5*  --   MG  --   < >  --  2.2  AST 15  --   --   --   ALT 7*  --   --   --   ALKPHOS 87  --   --   --   BILITOT 0.7  --   --   --   < > = values in this interval not displayed. ------------------------------------------------------------------------------------------------------------------  Cardiac Enzymes  Recent Labs Lab 06/07/15 1132  TROPONINI 0.03   ------------------------------------------------------------------------------------------------------------------  RADIOLOGY:  Dg Chest Port 1 View  06/11/2015  CLINICAL DATA:  Acute respiratory failure EXAM: PORTABLE CHEST 1 VIEW COMPARISON:  06/10/2015 FINDINGS: Airspace disease the right knee is worse. Right pleural effusion is worse. There is further shin of the mediastinum to the right indicating further volume loss in the right lower lobe. No pneumothorax. Tiny left pleural effusion has increased. Vascular congestion  is again noted. Interstitial edema characterized by Charyl DancerKerley B lines at the left base are stable. IMPRESSION: Increasing right basilar consolidation and collapse. Increasing bilateral pleural effusions. Stable edema and vascular congestion. Electronically Signed   By: Jolaine ClickArthur  Hoss M.D.   On: 06/11/2015 08:14   Dg Chest Port 1 View  06/10/2015  CLINICAL DATA:  Respiratory distress and respiratory failure. EXAM: PORTABLE CHEST 1 VIEW COMPARISON:  06/07/2015 CT and chest radiograph FINDINGS: Increasing right lower lung consolidation/atelectasis  noted. Cardiomegaly with mild pulmonary vascular congestion and possible mild interstitial edema noted. There is no evidence of pneumothorax. No other changes identified. IMPRESSION: Increasing right lower lung consolidation/atelectasis. Mild pulmonary vascular congestion with question of new mild interstitial edema. Electronically Signed   By: Harmon PierJeffrey  Hu M.D.   On: 06/10/2015 07:51    ASSESSMENT AND PLAN:   Active Problems:   Sepsis (HCC)   Pneumonia   #1 acute respiratory failure with hypoxia- septic shock on presentation  this is secondary to underlying COPD exacerbation, with pneumonia. -Continue treatment for underlying COPD with IV steroids, DuoNeb's, Pulmicort nebs and also empiric antibiotics for pneumonia. -Continue O2 supplementation and will monitor. - Initially on bipap, then tapered to nasal canula ( 06/10/15) again on 06/11/15- respi failure. - appreciated pulm help. - was on levophed drip- shock resolved now. - due to respi failure, and altered mental status- she will need to be intubated, Pulm spoke to her son and he agreed.  #2 COPD exacerbation-due to pneumonia and ongoing tobacco abuse. - on IV steroids, DuoNeb nebs around-the-clock, Pulmicort nebs. - Continue IV Zosyn for aspiration pneumonia. She will need to be assessed for home oxygen prior to discharge.  MRSA PCR  Negative.  #3 pneumonia-likely aspiration. -Continue IV Zosyn. Follow blood, sputum cultures. -We'll get speech therapy evaluation to assess swallowing.  #4 essential hypertension-patient's blood pressures on the low side given her respiratory failure presently.  -Hold antihypertensives.  #5 hypokalemia-will replace accordingly and repeat level in the morning. Check magnesium level.  #6 back pain-patient had a recent fall. No fractures on lumber spine xray.   She have fracture 9th to 12 th ribs on right side posterior region.  #7 spiculated lung nodule-this was noted on the CT chest. Patient did  have a long history of tobacco abuse and high risk for malignancy. She would benefit from a PET scan as an outpatient. -Patient has no acute hemoptysis. Consider pulmonary consult if needed  #8. Leukocytosis-secondary to the pneumonia. Follow white cell count with IV antibiotic therapy.  #9 history of falls-the physical therapy evaluation to assess mobility.   All the records are reviewed and case discussed with Care Management/Social Workerr. Management plans discussed with the patient, family and they are in agreement.  CODE STATUS: Full.  TOTAL TIME TAKING CARE OF THIS PATIENT: 35 critical care minutes.   POSSIBLE D/C IN 3-4 DAYS, DEPENDING ON CLINICAL CONDITION.  Altamese DillingVACHHANI, Joncarlo Friberg M.D on 06/11/2015   Between 7am to 6pm - Pager - [phone]  After 6pm go to [email or web address] - password [email or web address]  Fabio Neighbors[facility]  Office  [phone]  CC: Primary care physician; Jaclyn ShaggyATE,DENNY C, MD  Note: This dictation was prepared with Dragon dictation along with smaller phrase technology. Any transcriptional errors that result from this process are unintentional.

## 2015-06-11 NOTE — Progress Notes (Signed)
Critical value called from lab.  Troponin 0.65  Dr. Belia HemanKasa aware.

## 2015-06-11 NOTE — Progress Notes (Signed)
Notified son, Herbert SwazilandJordan, that we will transferring her back to ICU due to respiratory distress.

## 2015-06-11 NOTE — Care Management (Signed)
Patient transferred to icu due to respiratory distress, on bipap and high risk of intubation. has been evaluated by physical therapy prior to transfer and skilled nursing was recommended.  At present would anticipate this disposition is on hold due to decline in status.  There is no payor listed and this would be highly unlikely due to age.  Sent information to [employer] Means of medicare information from an admission in allscripts.  Medicare  [id number]

## 2015-06-11 NOTE — Progress Notes (Signed)
Initial Nutrition Assessment  DOCUMENTATION CODES:    Unable to assess, pt in the process of being intubated on visit today, reassess on follow  INTERVENTION:   EN: received verbal order from MD Kasa to start TF. Recommend starting Vital High Protein at rate of 20 ml/hr with initial goal rate of 50 ml/hr providing 1200 kcals, 106 g of protein and 1008 mL of free water. Meets 100% estimated protein and calories needs as per ASPEN guidelines. Continue to assess  NUTRITION DIAGNOSIS:   Inadequate oral intake related to acute illness, poor appetite as evidenced by NPO status, meal completion < 25%.  GOAL:   Patient will meet greater than or equal to 90% of their needs  MONITOR:   Vent status, Labs, Weight trends, TF tolerance, I & O's  REASON FOR ASSESSMENT:   Consult Assessment of nutrition requirement/status  ASSESSMENT:   [age] uo female admitted with AMS, acute respiratory failure with pneumonia, COPD with acute bronchospasm, admitted 06/07/15, required intubation on 06/11/15   Patient is currently intubated on ventilator support MV: 8 L/min Temp (24hrs), Avg:97.5 F (36.4 C), Min:97.4 F (36.3 C), Max:97.7 F (36.5 C)   Past Medical History  Diagnosis Date  . Dementia   . COPD (chronic obstructive pulmonary disease) (HCC)   . Essential hypertension    Diet Order:  Diet NPO time specified  Energy Intake: recorded po intake 0-25% of meals, Ensure ordered as well  Skin:  Reviewed, no issues  Last BM:  no BM documented   Labs: phosphorus 1.3 (06/10/15)-not supplemented, discussed during ICU rounds  Meds:  Reviewed  Height:   Ht Readings from Last 1 Encounters:  06/11/15 5\' 6"  (1.676 m)    Weight:   Wt Readings from Last 1 Encounters:  06/11/15 124 lb 5.4 oz (56.4 kg)    Filed Weights   06/07/15 1112 06/11/15 0800  Weight: 117 lb 12.8 oz (53.434 kg) 124 lb 5.4 oz (56.4 kg)    BMI:  Body mass index is 20.08 kg/(m^2).  Estimated Nutritional Needs:    Kcal:  1207 kcals ( Ve: 8, Tmax; 36.9) using wt of 56.4 kg  Protein:  84-112 g (1.5-2.0 g/kg)   Fluid:  >1.5 L  EDUCATION NEEDS:   Education needs no appropriate at this time  Romelle StarcherCate Jenavive Lamboy MS, RD, LDN [phone] Pager  [phone] Weekend/On-Call Pager

## 2015-06-11 NOTE — Consult Note (Signed)
MEDICATION RELATED CONSULT NOTE - INITIAL   Pharmacy Consult for Electrolyte Management Indication: Hypophosphatemia, hypokalemia  No Known Allergies  Patient Measurements: Height:  (167.6 cm) Weight: 124 lb 5.4 oz (56.4 kg) IBW/kg (Calculated) : 59.3  Vital Signs: Temp: 97.5 F (36.4 C) (04/03 0800) Temp Source: Axillary (04/03 0800) BP: 125/95 mmHg (04/03 0900) Pulse Rate: 107 (04/03 0900) Intake/Output from previous day: 04/02 0701 - 04/03 0700 In: 215.7 [I.V.:175.7; IV Piggyback:40] Out: 425 [Urine:425] Intake/Output from this shift:    Labs:  Recent Labs  06/08/15 1945 06/09/15 0500 06/10/15 0500 06/10/15 0501 06/11/15 0419  WBC 25.8* 22.4* 24.1*  --  18.2*  HGB 11.8* 10.9* 11.3*  --  12.1  HCT 37.7 32.8* 35.0  --  37.0  PLT 302 279 306  --  304  CREATININE QUANTITY NOT SUFFICIENT, UNABLE TO PERFORM TEST 0.55 0.63  --   --   MG  --  1.4*  --  2.2  --   PHOS  --  2.4*  --  1.3*  --   ALBUMIN 1.8*  --   --   --   --   PROT 4.4*  --   --   --   --   AST 15  --   --   --   --   ALT 7*  --   --   --   --   ALKPHOS 87  --   --   --   --   BILITOT 0.7  --   --   --   --    Estimated Creatinine Clearance: 49.9 mL/min (by C-G formula based on Cr of 0.63).   Microbiology: Recent Results (from the past 720 hour(s))  Culture, blood (routine x 2)     Status: None (Preliminary result)   Collection Time: 06/07/15 11:32 AM  Result Value Ref Range Status   Specimen Description BLOOD LEFT ASSIST CONTROL  Final   Special Requests   Final    BOTTLES DRAWN AEROBIC AND ANAEROBIC 10CCAERO,6CCANA   Culture NO GROWTH 3 DAYS  Final   Report Status PENDING  Incomplete  MRSA PCR Screening     Status: None   Collection Time: 06/08/15  1:00 AM  Result Value Ref Range Status   MRSA by PCR NEGATIVE NEGATIVE Final    Comment:        The GeneXpert MRSA Assay (FDA approved for NASAL specimens only), is one component of a comprehensive MRSA colonization surveillance  program. It is not intended to diagnose MRSA infection nor to guide or monitor treatment for MRSA infections.     Medical History: Past Medical History  Diagnosis Date  . Dementia   . COPD (chronic obstructive pulmonary disease) (H[facility])   . Essential hypertension     Medications:  Scheduled:  . antiseptic oral rinse  7 mL Mouth Rinse BID  . budesonide (PULMICORT) nebulizer solution  0.25 mg Nebulization BID  . donepezil  5 mg Oral QHS  . enoxaparin (LOVENOX) injection  40 mg Subcutaneous Q24H  . feeding supplement (ENSURE ENLIVE)  237 mL Oral TID WC  . free water  200 mL Per Tube 3 times per day  . ipratropium-albuterol  3 mL Nebulization Q6H  . nicotine  21 mg Transdermal Daily  . piperacillin-tazobactam (ZOSYN)  IV  3.375 g Intravenous 3 times per day  . potassium chloride  20 mEq Oral BID  . potassium phosphate IVPB (mmol)  30 mmol Intravenous Once  .  sodium chloride flush  3 mL Intravenous Q12H    Assessment: Leah [name] is an 82 yo female presenting with AMS, fall, and found pneumonia. Pharmacy consulted to manage electrolytes in this patient. 4/2 K 3.5, Mag 2.2, Phos 1.3  Plan:  Will recheck labs, as most recent levels are from yesterday.  Will give KPhos 30 mmol IV x1, providing 30mmol of phosphorus and ~4045mEq of potassium.   Will recheck labs at 1800.    Cy BlamerAllison K Kenzie Flakes 06/11/2015,1:02 PM

## 2015-06-11 NOTE — Procedures (Signed)
Endotracheal Intubation: Patient required placement of an artificial airway secondary to resp failure.   Consent: Emergent.   Hand washing performed prior to starting the procedure.   Medications administered for sedation prior to procedure: Midazolam 2 mg IV,  Vecuronium 10 mg IV, Fentanyl 50 mcg IV.   Procedure: A time out procedure was called and correct patient, name, & ID confirmed. Needed supplies and equipment were assembled and checked to include ETT, 10 ml syringe, Glidescope, Mac and Miller blades, suction, oxygen and bag mask valve, end tidal CO2 monitor. Patient was positioned to align the mouth and pharynx to facilitate visualization of the glottis.  Heart rate, SpO2 and blood pressure was continuously monitored during the procedure. Pre-oxygenation was conducted prior to intubation and endotracheal tube was placed through the vocal cords into the trachea.  During intubation an assistant applied gentle pressure to the cricoid cartilage.   The artificial airway was placed under direct visualization via glidescope route using a 8.0 ETT on the first attempt.    ETT was secured at 24 cm mark.    Placement was confirmed by auscuitation of lungs with good breath sounds bilaterally and no stomach sounds.  Condensation was noted on endotracheal tube.  Pulse ox 100%.  CO2 detector in place with appropriate color change.   Complications: None .   Operator: Keona Bilyeu.   Chest radiograph ordered and pending.   Comments: OGT placed via glidescope.  Lucie LeatherKurian David Sherrol Vicars, M.D.  [facility]  [facility] Director [facility] Medical Director [facility] Card[facility]

## 2015-06-11 NOTE — Clinical Social Work Note (Signed)
Patient has had multiple bed offers but is currently on bipap; continuous. CSW will continue to follow. York SpanielMonica Aries Kasa MSW,LCSW [phone]

## 2015-06-11 NOTE — Progress Notes (Addendum)
Pt clammy, with bp of 88/68, O2 sat 73%-FSBS 180. Rapid response called-placed on 100% NRB-notified Dr Pyreddy-BP up to 104/80 sating 89%-consult to intensivist, will transfer back to CCU on bipap

## 2015-06-11 NOTE — Progress Notes (Signed)
[facility], updated on changes in patient-will transport to [id number].

## 2015-06-11 NOTE — Procedures (Signed)
Central Venous Catheter Placement: Indication: Patient receiving vesicant or irritant drug.; Patient receiving intravenous therapy for longer than 5 days.; Patient has limited or no vascular access.   Consent:verbal/written  Risks and benefits explained in detail including risk of infection, bleeding, respiratory failure and death..   Hand washing performed prior to starting the procedure.   Procedure: An active timeout was performed and correct patient, name, & ID confirmed.  After explaining risk and benefits, patient was positioned correctly for central venous access. Patient was prepped using strict sterile technique including chlorohexadine preps, sterile drape, sterile gown and sterile gloves.  The area was prepped, draped and anesthetized in the usual sterile manner. Patient comfort was obtained.  A triple lumen catheter was placed in RT Internal Jugular Vein There was good blood return, catheter caps were placed on lumens, catheter flushed easily, the line was secured and a sterile dressing and BIO-PATCH applied.   Ultrasound was used to visualize vasculature and guidance of needle.   Number of Attempts: 1 Complications:none Estimated Blood Loss: none Chest Radiograph indicated and ordered.  Operator: Aylah Yeary.   Leah [name], M.D.  [facility]  Medical Director [facility] [place] Medical Director [facility]io[facility] Department     

## 2015-06-11 NOTE — Progress Notes (Signed)
PT Cancellation Note  Patient Details Name: Clytee I Swazil[place]Jordan MRN: [id number] DOB: 09-21-1932   Cancelled Treatment:     Pt transferred to CCU. Discontinued PT order received and acknowledged. Please enter new order if status or needs change.  Sharalyn InkJason D Huprich PT, DPT   Huprich,Jason 06/11/2015, 10:33 AM

## 2015-06-11 NOTE — Plan of Care (Signed)
Problem: Health Behavior/Discharge Planning: Goal: Ability to manage health-related needs will improve Pt has dementia so pt has a hard time remembering to keep her nasal cannula on. Pt is reminded of the benefit and reason to have nasal cannula in place. Per pt's family pt does not like taking her medications at home. Pt does not have any PO meds at this time. Social work consult in place and Leah [name] with social work met with family. Leah [name],Leah [name] E 4:07 PM 06/09/2015  Outcome: Not Progressing Pt with dementia  Problem: Fluid Volume: Goal: Ability to maintain a balanced intake and output will improve Outcome: Not Progressing No appetite  Problem: Nutrition: Goal: Adequate nutrition will be maintained Outcome: Not Progressing No appetite  Problem: Activity: Goal: Will verbalize the importance of balancing activity with adequate rest periods Outcome: Not Progressing Pt with dementia  Problem: Education: Goal: Knowledge of disease or condition will improve Outcome: Not Progressing Pt with dementia Goal: Knowledge of the prescribed therapeutic regimen will improve Outcome: Not Progressing Pt with dementia  Problem: Coping: Goal: Ability to identify and develop effective coping behavior will improve Outcome: Not Progressing Pt with dementia  Problem: Health Behavior/Discharge Planning: Goal: Ability to manage health-related needs will improve Outcome: Not Progressing Pt with dementia  Problem: Nutritional: Goal: Maintenance of adequate nutrition will improve Outcome: Not Progressing No appetite

## 2015-06-11 NOTE — Progress Notes (Signed)
CC follow up acute resp failure HPI: patient obtunded and on biPAP and with severe resp distress High risk for intubation, cardiac arrest,    Filed Vitals:   06/11/15 0640 06/11/15 0800 06/11/15 0818 06/11/15 0900  BP: 104/80 111/98  125/95  Pulse:  124  107  Temp:  97.5 F (36.4 C)    TempSrc:  Axillary    Resp:  30  32  Height:  5\' 6"  (1.676 m)    Weight:  124 lb 5.4 oz (56.4 kg)    SpO2: 89% 97% 89% 96%   Review of Systems  Unable to perform ROS: critical illness    Physical Exam  Vitals reviewed. Constitutional: She appears toxic. She appears ill. She appears distressed. She is sedated and intubated.  HENT:  Head: Normocephalic and atraumatic.  Mouth/Throat: Oropharynx is clear and moist.  Eyes: Pupils are equal, round, and reactive to light.  Neck: Normal range of motion. Neck supple. Carotid bruit is not present. No tracheal deviation present.  Cardiovascular: Normal rate, regular rhythm and normal heart sounds.   No murmur heard. Respiratory: She is intubated. She is in respiratory distress. She has wheezes. She has rales.  Rhonchi  GI: Soft. Bowel sounds are normal.  Musculoskeletal: She exhibits no edema.  Neurological: She exhibits normal muscle tone. GCS eye subscore is 4. GCS verbal subscore is 5. GCS motor subscore is 6. She displays no Babinski's sign on the right side. She displays no Babinski's sign on the left side.  gcs<8  Skin: Skin is warm. No rash noted. She is diaphoretic. No cyanosis.       BMP Latest Ref Rng 06/10/2015 06/09/2015 06/08/2015  Glucose 65 - 99 mg/dL 161(W117(H) 960(A124(H) 540(J135(H)  BUN 6 - 20 mg/dL 20 14 QUANTITY NOT SUFFICIENT, UNABLE TO PERFORM TEST  Creatinine 0.44 - 1.00 mg/dL 8.110.63 9.140.55 QUANTITY NOT SUFFICIENT, UNABLE TO PERFORM TEST  Sodium 135 - 145 mmol/L 143 140 140  Potassium 3.5 - 5.1 mmol/L 3.7 3.0(L) 2.6(LL)  Chloride 101 - 111 mmol/L 112(H) 111 120(H)  CO2 22 - 32 mmol/L 29 27 17(L)  Calcium 8.9 - 10.3 mg/dL 7.8(G8.5(L) 8.3(L) 6.5(L)     CBC Latest Ref Rng 06/11/2015 06/10/2015 06/09/2015  WBC 3.6 - 11.0 K/uL 18.2(H) 24.1(H) 22.4(H)  Hemoglobin 12.0 - 16.0 g/dL 95.612.1 11.3(L) 10.9(L)  Hematocrit 35.0 - 47.0 % 37.0 35.0 32.8(L)  Platelets 150 - 440 K/uL 304 306 279    CXR: increased RLL opacity  IMPRESSION: Acute hypoxemic respiratory failure from acute pneumonia COPD with acute bronchospasm RLL PNA, NOS High risk for intubation and cardiac arrest  PLAN/REC: 1.Respiratory Failure-abg shows severe resp acidosis and severe hypoxic resp failure High risk for intubation  2.pneumonia-continue iv abx 3.continue steroids 4.agressive BD therapy  I have personally obtained a history, examined the patient, evaluated Pertinent laboratory and RadioGraphic/imaging results, and  formulated the assessment and plan   The Patient requires high complexity decision making for assessment and support, frequent evaluation and titration of therapies, application of advanced monitoring technologies and extensive interpretation of multiple databases. Critical Care Time devoted to patient care services described in this note is 45 minutes.   Overall, patient is critically ill, prognosis is guarded.  Patient with Multiorgan failure and at high risk for cardiac arrest and death.    Lucie LeatherKurian David Krissa Utke, M.D.  [facility]  Medical Director [facility] Medical Director [facility] Card[facility]

## 2015-06-11 NOTE — Progress Notes (Signed)
eLink Physician-Brief Progress Note Patient Name: Hailly I Swazil[place]Jordan DOB: 1932/05/25 MRN: [id number]   Date of Service  06/11/2015  HPI/Events of Note    eICU Interventions  protonix     Intervention Category Intermediate Interventions: Best-practice therapies (e.g. DVT, beta blocker, etc.)  ALVA,RAKESH V. 06/11/2015, 7:14 PM

## 2015-06-12 LAB — COMPREHENSIVE METABOLIC PANEL
ALBUMIN: 2.5 g/dL — AB (ref 3.5–5.0)
ALT: 39 U/L (ref 14–54)
AST: 32 U/L (ref 15–41)
Alkaline Phosphatase: 57 U/L (ref 38–126)
Anion gap: 1 — ABNORMAL LOW (ref 5–15)
BUN: 28 mg/dL — AB (ref 6–20)
CHLORIDE: 106 mmol/L (ref 101–111)
CO2: 35 mmol/L — AB (ref 22–32)
CREATININE: 0.75 mg/dL (ref 0.44–1.00)
Calcium: 8.4 mg/dL — ABNORMAL LOW (ref 8.9–10.3)
GFR calc Af Amer: >60 mL/min (ref >60)
GFR calc non Af Amer: >60 mL/min (ref >60)
Glucose, Bld: 162 mg/dL — ABNORMAL HIGH (ref 65–99)
POTASSIUM: 4.1 mmol/L (ref 3.5–5.1)
SODIUM: 142 mmol/L (ref 135–145)
Total Bilirubin: 0.8 mg/dL (ref 0.3–1.2)
Total Protein: 5.6 g/dL — ABNORMAL LOW (ref 6.5–8.1)

## 2015-06-12 LAB — CULTURE, BLOOD (ROUTINE X 2): CULTURE: NO GROWTH

## 2015-06-12 LAB — PHOSPHORUS: Phosphorus: 2.8 mg/dL (ref 2.5–4.6)

## 2015-06-12 LAB — GLUCOSE, CAPILLARY
GLUCOSE-CAPILLARY: 154 mg/dL — AB (ref 65–99)
GLUCOSE-CAPILLARY: 164 mg/dL — AB (ref 65–99)
GLUCOSE-CAPILLARY: 163 mg/dL — AB (ref 65–99)
Glucose-Capillary: 117 mg/dL — ABNORMAL HIGH (ref 65–99)
Glucose-Capillary: 151 mg/dL — ABNORMAL HIGH (ref 65–99)
Glucose-Capillary: 165 mg/dL — ABNORMAL HIGH (ref 65–99)

## 2015-06-12 LAB — MAGNESIUM: Magnesium: 1.8 mg/dL (ref 1.7–2.4)

## 2015-06-12 LAB — POTASSIUM: Potassium: 3.9 mmol/L (ref 3.5–5.1)

## 2015-06-12 LAB — TROPONIN I: TROPONIN I: 0.61 ng/mL — AB (ref <0.031)

## 2015-06-12 MED ORDER — SODIUM CHLORIDE 0.9% FLUSH: 10.0000 mL | INTRAVENOUS | Status: DC | PRN

## 2015-06-12 MED ORDER — SENNOSIDES-DOCUSATE SODIUM 8.6-50 MG PO TABS
1.0000 | ORAL_TABLET | Freq: Two times a day (BID) | ORAL | Status: DC
  Administered 2015-06-12 – 2015-06-13 (×3): 1 via ORAL
  Filled 2015-06-12 (×3): qty 1

## 2015-06-12 MED ORDER — POTASSIUM CHLORIDE 20 MEQ/15ML (10%) PO SOLN
20.0000 meq | Freq: Two times a day (BID) | ORAL | Status: DC
  Administered 2015-06-12 – 2015-06-18 (×12): 20 meq
  Filled 2015-06-12 (×12): qty 15

## 2015-06-12 NOTE — Progress Notes (Signed)
Speech Therapy Note: received order but upon reviewing chart and consulting NSG, pt has been orally intubated. Will place pt on hold and f/u when extubated. NSG agreed.

## 2015-06-12 NOTE — Progress Notes (Signed)
Nutrition Follow-up    INTERVENTION:   EN: recommend continuing current TF regimen at current goal rate as tolerated by pt; continue to assess. Lack of BM discussed during ICU rounds, plan for bowel regimen per MD Kasa  NUTRITION DIAGNOSIS:   Inadequate oral intake related to acute illness, poor appetite as evidenced by NPO status, meal completion < 25%. Being addressed via TF  GOAL:   Patient will meet greater than or equal to 90% of their needs  MONITOR:   Vent status, Labs, Weight trends, TF tolerance, I & O's  REASON FOR ASSESSMENT:   Consult Assessment of nutrition requirement/status  ASSESSMENT:   82 uo female admitted with AMS, acute respiratory [id number] with pneumonia, COPD with acute bronchospasm, admitted 06/07/15, required intubation on 06/11/15  Patient is currently intubated on ventilator support, did not follow commands during wake up assessment this AM, levophed at 2 mcg/min MV:  8.2 L/min (Equation MV 8) Temp (24hrs), Avg:97.8 F (36.6 C), Min:97.5 F (36.4 C), Max:98 F (36.7 C)   Diet Order:  Diet NPO time specified   EN: tolerating Vital High Protein at rate of 50 ml/hr  Digestive System: OG in place, BS hypoactive, abdomen soft  Skin:  Reviewed, no issues  Last BM:  no BM documented   Glucose Profile:  Recent Labs  06/12/15 0035 06/12/15 0405 06/12/15 0750  GLUCAP 165* 154* 164*   Electrolyte and Renal Profile: phosphorus now wdl  Recent Labs Lab 06/08/15 1945  06/09/15 0500 06/10/15 0500 06/10/15 0501 06/11/15 1712 06/12/15 0524  BUN QUANTITY NOT SUFFICIENT, UNABLE TO PERFORM TEST  --  14 20  --   --   --   CREATININE QUANTITY NOT SUFFICIENT, UNABLE TO PERFORM TEST  --  0.55 0.63  --   --   --   NA 140  --  140 143  --   --   --   K 2.6*  --  3.0* 3.7  --  4.3 3.9  MG  --   < > 1.4*  --  2.2 1.9 1.8  PHOS  --   < > 2.4*  --  1.3* 3.4 2.8  < > = values in this interval not displayed.  Meds: reviewed  Height:   Ht Readings  from Last 1 Encounters:  06/11/15 5\' 6"  (1.676 m)    Weight:   Wt Readings from Last 1 Encounters:  06/12/15 121 lb 4.1 oz (55 kg)   Filed Weights   06/07/15 1112 06/11/15 0800 06/12/15 0500  Weight: 117 lb 12.8 oz (53.434 kg) 124 lb 5.4 oz (56.4 kg) 121 lb 4.1 oz (55 kg)    BMI:  Body mass index is 19.58 kg/(m^2).  Estimated Nutritional Needs:   Kcal:  1207 kcals ( Ve: 8, Tmax; 36.9) using wt of 56.4 kg  Protein:  84-112 g (1.5-2.0 g/kg)   Fluid:  >1.5 L  EDUCATION NEEDS:   Education needs no appropriate at this time  Romelle StarcherCate Bryne Lindon MS, RD, LDN [phone] Pager  [phone] Weekend/On-Call Pager

## 2015-06-12 NOTE — Clinical Documentation Improvement (Signed)
Internal Medicine Critical Care  Can the diagnosis of Malnutrition be further specified with an acuity level? Thank you   Severe PCM  Moderate PCM  Mild PCM  Other condition  Unable to clinically determine  Document any associated diagnoses/conditions:  Sepsis, Septic shock , Asp PNA , Cachectic BMI 19.02 kg/m2   Supporting Information: :  Doc Critical Care prog nt 06/08/15 11:42 am   Please exercise your independent, professional judgment when responding. A specific answer is not anticipated or expected.   Thank You, Lavonda JumboLawanda J Zadkiel Dragan [employer] Information Management [place] [phone]

## 2015-06-12 NOTE — Consult Note (Signed)
MEDICATION RELATED CONSULT NOTE - INITIAL   Pharmacy Consult for Electrolyte Management Indication: Hypophosphatemia, hypokalemia  No Known Allergies  Patient Measurements: Height: 5' 6"  (167.6 cm) Weight: 121 lb 4.1 oz (55 kg) IBW/kg (Calculated) : 59.3  Vital Signs: BP: 87/59 mmHg (04/04 0700) Pulse Rate: 82 (04/04 0700) Intake/Output from previous day: 04/03 0701 - 04/04 0700 In: 1565.4 [I.V.:389.4; NG/GT:466; IV Piggyback:710] Out: 450 [Urine:450] Intake/Output from this shift:    Labs:  Recent Labs  06/10/15 0500 06/10/15 0501 06/11/15 0419 06/11/15 1712 06/12/15 0524  WBC 24.1*  --  18.2*  --   --   HGB 11.3*  --  12.1  --   --   HCT 35.0  --  37.0  --   --   PLT 306  --  304  --   --   CREATININE 0.63  --   --   --   --   MG  --  2.2  --  1.9 1.8  PHOS  --  1.3*  --  3.4 2.8   Estimated Creatinine Clearance: 48.7 mL/min (by C-G formula based on Cr of 0.63).   Microbiology: Recent Results (from the past 720 hour(s))  Culture, blood (routine x 2)     Status: None   Collection Time: 06/07/15 11:32 AM  Result Value Ref Range Status   Specimen Description BLOOD LEFT ASSIST CONTROL  Final   Special Requests   Final    BOTTLES DRAWN AEROBIC AND ANAEROBIC [place]   Culture NO GROWTH 5 DAYS  Final   Report Status 06/12/2015 FINAL  Final  MRSA PCR Screening     Status: None   Collection Time: 06/08/15  1:00 AM  Result Value Ref Range Status   MRSA by PCR NEGATIVE NEGATIVE Final    Comment:        The GeneXpert MRSA Assay (FDA approved for NASAL specimens only), is one component of a comprehensive MRSA colonization surveillance program. It is not intended to diagnose MRSA infection nor to guide or monitor treatment for MRSA infections.     Medical History: Past Medical History  Diagnosis Date  . Dementia   . COPD (chronic obstructive pulmonary disease) ([place])   . Essential hypertension     Medications:  Scheduled:  . antiseptic oral  rinse  7 mL Mouth Rinse 10 times per day  . budesonide (PULMICORT) nebulizer solution  0.25 mg Nebulization BID  . chlorhexidine gluconate (SAGE KIT)  15 mL Mouth Rinse BID  . donepezil  5 mg Oral QHS  . enoxaparin (LOVENOX) injection  40 mg Subcutaneous Q24H  . free water  200 mL Per Tube 3 times per day  . ipratropium-albuterol  3 mL Nebulization Q6H  . nicotine  21 mg Transdermal Daily  . pantoprazole sodium  40 mg Per Tube Q1200  . piperacillin-tazobactam (ZOSYN)  IV  3.375 g Intravenous 3 times per day  . potassium chloride  20 mEq Per Tube BID  . senna-docusate  1 tablet Oral BID  . sodium chloride flush  3 mL Intravenous Q12H    Assessment: Leah [name] is an 82 yo female presenting with AMS, fall, and found pneumonia. Pharmacy consulted to manage electrolytes in this patient.  K= 3.9, Phos= 2.8, Mag= 1.8  Plan:  Electrolytes are WNL. Will recheck in 2 days.   Ulice Dash, PharmD Clinical Pharmacist   06/12/2015,3:29 PM

## 2015-06-12 NOTE — Progress Notes (Signed)
[place] NOTE  Pharmacy Consult for Constipation Prevention    No Known Allergies  Patient Measurements: Height: _0  (167.6 cm) Weight: 121 lb 4.1 oz (55 kg) IBW/kg (Calculated) : 59.3   Vital Signs: BP: 87/59 mmHg (04/04 0700) Pulse Rate: 82 (04/04 0700) Intake/Output from previous day: 04/03 0701 - 04/04 0700 In: 1565.4 [I.V.:389.4; NG/GT:466; IV Piggyback:710] Out: 450 [Urine:450] Intake/Output from this shift:   Vent settings for last 24 hours: Vent Mode:  [-] PRVC FiO2 (%):  [40 %] 40 % Set Rate:  [20 bmp] 20 bmp Vt Set:  [400 mL] 400 mL PEEP:  [5 cmH20] 5 cmH20  Labs:  Recent Labs  06/10/15 0500 06/10/15 0501 06/11/15 0419 06/11/15 1712 06/12/15 0524  WBC 24.1*  --  18.2*  --   --   HGB 11.3*  --  12.1  --   --   HCT 35.0  --  37.0  --   --   PLT 306  --  304  --   --   CREATININE 0.63  --   --   --   --   MG  --  2.2  --  1.9 1.8  PHOS  --  1.3*  --  3.4 2.8   Estimated Creatinine Clearance: 48.7 mL/min (by C-G formula based on Cr of 0.63).   Recent Labs  06/12/15 0405 06/12/15 0750 06/12/15 1132  GLUCAP 154* 164* 163*    Microbiology: Recent Results (from the past 720 hour(s))  Culture, blood (routine x 2)     Status: None   Collection Time: 06/07/15 11:32 AM  Result Value Ref Range Status   Specimen Description BLOOD LEFT ASSIST CONTROL  Final   Special Requests   Final    BOTTLES DRAWN AEROBIC AND ANAEROBIC [place]   Culture NO GROWTH 5 DAYS  Final   Report Status 06/12/2015 FINAL  Final  MRSA PCR Screening     Status: None   Collection Time: 06/08/15  1:00 AM  Result Value Ref Range Status   MRSA by PCR NEGATIVE NEGATIVE Final    Comment:        The GeneXpert MRSA Assay (FDA approved for NASAL specimens only), is one component of a comprehensive MRSA colonization surveillance program. It is not intended to diagnose MRSA infection nor to guide or monitor treatment for MRSA infections.      Medications:  Scheduled:  . antiseptic oral rinse  7 mL Mouth Rinse 10 times per day  . budesonide (PULMICORT) nebulizer solution  0.25 mg Nebulization BID  . chlorhexidine gluconate (SAGE KIT)  15 mL Mouth Rinse BID  . donepezil  5 mg Oral QHS  . enoxaparin (LOVENOX) injection  40 mg Subcutaneous Q24H  . free water  200 mL Per Tube 3 times per day  . ipratropium-albuterol  3 mL Nebulization Q6H  . nicotine  21 mg Transdermal Daily  . pantoprazole sodium  40 mg Per Tube Q1200  . piperacillin-tazobactam (ZOSYN)  IV  3.375 g Intravenous 3 times per day  . potassium chloride  20 mEq Per Tube BID  . senna-docusate  1 tablet Oral BID  . sodium chloride flush  3 mL Intravenous Q12H   Infusions:  . feeding supplement (VITAL HIGH PROTEIN) 1,000 mL (06/12/15 0600)  . fentaNYL infusion INTRAVENOUS 40 mcg/hr (06/11/15 2030)  . norepinephrine 2 mcg/min (06/12/15 0530)    Assessment: Pharmacy consulted to assist in constipation prevention in this 82 y/o F with sepsis  currently requiring intubation and sedation.   Plan:  Will begin senna-docusate 1 bid and f/u am.   Ulice Dash D 06/12/2015,3:38 PM

## 2015-06-12 NOTE — Progress Notes (Signed)
Pharmacy Antibiotic Note  Leah [name] is a 82 y.o. female admitted on 06/07/2015 with pneumonia.  Pharmacy has been consulted for zosyn dosing.  Plan: Continue Zosyn 3.375 gm IV Q8H EI.  Height: 5\' 6"  (167.6 cm) Weight: 121 lb 4.1 oz (55 kg) IBW/kg (Calculated) : 59.3  Temp (24hrs), Avg:97.8 F (36.6 C), Min:97.5 F (36.4 C), Max:98 F (36.7 C)   Recent Labs Lab 06/07/15 1132 06/07/15 1431 06/07/15 2304 06/08/15 0435 06/08/15 1945 06/09/15 0500 06/10/15 0500 06/11/15 0419  WBC 42.1*  --   --  32.4* 25.8* 22.4* 24.1* 18.2*  CREATININE 0.85  --  0.63 0.59 QUANTITY NOT SUFFICIENT, UNABLE TO PERFORM TEST 0.55 0.63  --   LATICACIDVEN 1.9 1.1  --   --   --   --   --   --     Estimated Creatinine Clearance: 48.7 mL/min (by C-G formula based on Cr of 0.63).    No Known Allergies  Antibiotics:  Zosyn 3/30 >> Azithromycin 3/30 >> 3/30 Vancomycin 3/30 >>3/30  Microbiology: 3/31 MRSA PCR: negative 3/30 Blood cx: negative  Thank you for allowing pharmacy to be a part of this patient's care.  Luisa Harthristy, Dylann Layne D, Pharm.D., BCPS Clinical Pharmacist 06/12/2015 3:34 PM

## 2015-06-12 NOTE — Care Management (Signed)
Patient is LTAC appropriate if family wishes. RN anticipates that patient will be difficult to wean. Changed to DNR.

## 2015-06-12 NOTE — Progress Notes (Signed)
CC follow up acute resp failure HPI: patient intubated,sedated Remains critically ill  RT IJ CVL 4/3>>>> ETT 9.0 4/3>>>>   Filed Vitals:   06/12/15 0400 06/12/15 0500 06/12/15 0600 06/12/15 0700  BP: 91/73 105/68 97/57 87/59   Pulse: 111 115 84 82  Temp:      TempSrc:      Resp: 20 20 20 20   Height:      Weight:  121 lb 4.1 oz (55 kg)    SpO2: 96% 96% 95% 96%   Review of Systems  Unable to perform ROS: critical illness    Physical Exam  Vitals reviewed. Constitutional: She appears toxic. She appears ill. No distress. She is sedated and intubated.  HENT:  Head: Normocephalic and atraumatic.  Mouth/Throat: Oropharynx is clear and moist.  Eyes: Pupils are equal, round, and reactive to light.  Neck: Normal range of motion. Neck supple. Carotid bruit is not present. No tracheal deviation present.  Cardiovascular: Normal rate, regular rhythm and normal heart sounds.   No murmur heard. Respiratory: She is intubated. No respiratory distress. She has no wheezes. She has rales.  Rhonchi  GI: Soft. Bowel sounds are normal.  Musculoskeletal: She exhibits no edema.  Neurological: She exhibits normal muscle tone. GCS eye subscore is 4. GCS verbal subscore is 5. GCS motor subscore is 6. She displays no Babinski's sign on the right side. She displays no Babinski's sign on the left side.  gcs<8T  Skin: Skin is warm. No rash noted. She is not diaphoretic. No cyanosis.       BMP Latest Ref Rng 06/12/2015 06/11/2015 06/10/2015  Glucose 65 - 99 mg/dL - - 117(H)  BUN 6 - 20 mg/dL - - 20  Creatinine 0.44 - 1.00 mg/dL - - 0.63  Sodium 135 - 145 mmol/L - - 143  Potassium 3.5 - 5.1 mmol/L 3.9 4.3 3.7  Chloride 101 - 111 mmol/L - - 112(H)  CO2 22 - 32 mmol/L - - 29  Calcium 8.9 - 10.3 mg/dL - - 8.5(L)    CBC Latest Ref Rng 06/11/2015 06/10/2015 06/09/2015  WBC 3.6 - 11.0 K/uL 18.2(H) 24.1(H) 22.4(H)  Hemoglobin 12.0 - 16.0 g/dL 12.1 11.3(L) 10.9(L)  Hematocrit 35.0 - 47.0 % 37.0 35.0 32.8(L)   Platelets 150 - 440 K/uL 304 306 279    CXR: increased RLL opacity  IMPRESSION:82 yo white female with acute resp failure from acute COPD exacerbation and acute pneumonia  1.Respiratory Failure -continue Full MV support -continue Bronchodilator Therapy -Wean Fio2 and PEEP as tolerated -will perform SAT/SBt when respiratory parameters are met  2.COPD exacerbation -IV steroids, IV abx -aggressive BD therapy  After discussion with Son, will make patient DNR  I have personally obtained a history, examined the patient, evaluated Pertinent laboratory and RadioGraphic/imaging results, and  formulated the assessment and plan   The Patient requires high complexity decision making for assessment and support, frequent evaluation and titration of therapies, application of advanced monitoring technologies and extensive interpretation of multiple databases. Critical Care Time devoted to patient care services described in this note is 45 minutes.   Overall, patient is critically ill, prognosis is guarded.  Patient with Multiorgan failure and at high risk for cardiac arrest and death.    Corrin Parker, M.D.  [facility]  Medical Director [place] Director [facility] Cardio-Pulmonary Department

## 2015-06-13 LAB — GLUCOSE, CAPILLARY
GLUCOSE-CAPILLARY: 160 mg/dL — AB (ref 65–99)
GLUCOSE-CAPILLARY: 183 mg/dL — AB (ref 65–99)
GLUCOSE-CAPILLARY: 188 mg/dL — AB (ref 65–99)
GLUCOSE-CAPILLARY: 198 mg/dL — AB (ref 65–99)
Glucose-Capillary: 127 mg/dL — ABNORMAL HIGH (ref 65–99)
Glucose-Capillary: 150 mg/dL — ABNORMAL HIGH (ref 65–99)
Glucose-Capillary: 158 mg/dL — ABNORMAL HIGH (ref 65–99)

## 2015-06-13 LAB — CREATININE, SERUM: Creatinine, Ser: 0.61 mg/dL (ref 0.44–1.00)

## 2015-06-13 LAB — BLOOD GAS, ARTERIAL
ACID-BASE EXCESS: 10.4 mmol/L — AB (ref 0.0–3.0)
Allens test (pass/fail): POSITIVE — AB
BICARBONATE: 38.4 meq/L — AB (ref 21.0–28.0)
FIO2: 0.4
O2 SAT: 92.2 %
PATIENT TEMPERATURE: 37
PCO2 ART: 68 mmHg — AB (ref 32.0–48.0)
PEEP: 5 cmH2O
PO2 ART: 67 mmHg — AB (ref 83.0–108.0)
Pressure support: 8 cmH2O
pH, Arterial: 7.36 (ref 7.350–7.450)

## 2015-06-13 MED ORDER — SENNOSIDES-DOCUSATE SODIUM 8.6-50 MG PO TABS
2.0000 | ORAL_TABLET | Freq: Two times a day (BID) | ORAL | Status: DC
  Administered 2015-06-13 – 2015-06-20 (×12): 2 via ORAL
  Filled 2015-06-13 (×12): qty 2
  Filled 2015-06-13: qty 1
  Filled 2015-06-13 (×2): qty 2

## 2015-06-13 NOTE — Care Management (Signed)
Patient is now DNR.  Intensivist will speak with patient's son regarding goals of treatment which may include extubation/do not reintubate.

## 2015-06-13 NOTE — Progress Notes (Signed)
CC follow up acute resp failure HPI: patient intubated,sedated Remains critically ill  RT IJ CVL 4/3>>>> ETT 9.0 4/3>>>>   Filed Vitals:   06/13/15 0400 06/13/15 0500 06/13/15 0600 06/13/15 0700  BP: _0 93/68  Pulse: 73 70 69 65  Temp: 98.2 F (36.8 C)     TempSrc: Axillary     Resp: _1 Height:      Weight:  121 lb 11.1 oz (55.2 kg)    SpO2: 100% 100% 100% 100%   Review of Systems  Unable to perform ROS: critical illness    Physical Exam  Vitals reviewed. Constitutional: She appears toxic. She appears ill. No distress. She is sedated and intubated.  HENT:  Head: Normocephalic and atraumatic.  Mouth/Throat: Oropharynx is clear and moist.  Eyes: Pupils are equal, round, and reactive to light.  Neck: Normal range of motion. Neck supple. Carotid bruit is not present. No tracheal deviation present.  Cardiovascular: Normal rate, regular rhythm and normal heart sounds.   No murmur heard. Respiratory: She is intubated. No respiratory distress. She has no wheezes. She has rales.  Rhonchi  GI: Soft. Bowel sounds are normal.  Musculoskeletal: She exhibits no edema.  Neurological: She exhibits normal muscle tone. GCS eye subscore is 4. GCS verbal subscore is 5. GCS motor subscore is 6. She displays no Babinski's sign on the right side. She displays no Babinski's sign on the left side.  gcs<8T  Skin: Skin is warm. No rash noted. She is not diaphoretic. No cyanosis.       BMP Latest Ref Rng 06/13/2015 06/12/2015 06/12/2015  Glucose 65 - 99 mg/dL - 162(H) -  BUN 6 - 20 mg/dL - 28(H) -  Creatinine 0.44 - 1.00 mg/dL 0.61 0.75 -  Sodium 135 - 145 mmol/L - 142 -  Potassium 3.5 - 5.1 mmol/L - 4.1 3.9  Chloride 101 - 111 mmol/L - 106 -  CO2 22 - 32 mmol/L - 35(H) -  Calcium 8.9 - 10.3 mg/dL - 8.4(L) -    CBC Latest Ref Rng 06/11/2015 06/10/2015 06/09/2015  WBC 3.6 - 11.0 K/uL 18.2(H) 24.1(H) 22.4(H)  Hemoglobin 12.0 - 16.0 g/dL 12.1 11.3(L) 10.9(L)  Hematocrit 35.0  - 47.0 % 37.0 35.0 32.8(L)  Platelets 150 - 440 K/uL 304 306 279    CXR: increased RLL opacity  IMPRESSION:82 yo white female with acute resp failure from acute COPD exacerbation and acute pneumonia  1.Respiratory Failure -continue Full MV support -continue Bronchodilator Therapy -Wean Fio2 and PEEP as tolerated -will perform SAT/SBt when respiratory parameters are met  2.COPD exacerbation -IV steroids, IV abx -aggressive BD therapy  3.COnt Nutrition 4.DVT and GI prx  After discussion with Son, will make patient DNR. Will conatct him today and assess goals of care  I have personally obtained a history, examined the patient, evaluated Pertinent laboratory and RadioGraphic/imaging results, and  formulated the assessment and plan   The Patient requires high complexity decision making for assessment and support, frequent evaluation and titration of therapies, application of advanced monitoring technologies and extensive interpretation of multiple databases. Critical Care Time devoted to patient care services described in this note is 35 minutes.   Overall, patient is critically ill, prognosis is guarded.  Patient with Multiorgan failure and at high risk for cardiac arrest and death.    Corrin Parker, M.D.  [facility]  Medical Director [place] Director [facility] Cardio-Pulmonary Department

## 2015-06-13 NOTE — Progress Notes (Signed)
Pharmacy Antibiotic Note  Leah [name] is a 82 y.o. female admitted on 06/07/2015 with pneumonia.  Pharmacy has been consulted for zosyn dosing.  Plan: Continue Zosyn 3.375 gm IV Q8H EI.  Height: 5\' 6"  (167.6 cm) Weight: 121 lb 11.1 oz (55.2 kg) IBW/kg (Calculated) : 59.3  Temp (24hrs), Avg:98.1 F (36.7 C), Min:98 F (36.7 C), Max:98.2 F (36.8 C)   Recent Labs Lab 06/07/15 1132 06/07/15 1431  06/08/15 0435 06/08/15 1945 06/09/15 0500 06/10/15 0500 06/11/15 0419 06/12/15 1405 06/13/15 0505  WBC 42.1*  --   --  32.4* 25.8* 22.4* 24.1* 18.2*  --   --   CREATININE 0.85  --   < > 0.59 QUANTITY NOT SUFFICIENT, UNABLE TO PERFORM TEST 0.55 0.63  --  0.75 0.61  LATICACIDVEN 1.9 1.1  --   --   --   --   --   --   --   --   < > = values in this interval not displayed.  Estimated Creatinine Clearance: 48.9 mL/min (by C-G formula based on Cr of 0.61).    No Known Allergies  Antibiotics:  Zosyn 3/30 >> Azithromycin 3/30 >> 3/30 Vancomycin 3/30 >>3/30  Microbiology: 3/31 MRSA PCR: negative 3/30 Blood cx: negative  Thank you for allowing pharmacy to be a part of this patient's care.  Luisa Harthristy, Cabrina Shiroma D, Pharm.D., BCPS Clinical Pharmacist 06/13/2015 12:55 PM

## 2015-06-13 NOTE — Progress Notes (Signed)
Pt has been able to follow simple commands such as opening her eyes on command and wiggling her toes. Sedation has remained off since around 0800. Pt has been calm and cooperative. Lung sounds: rhonchi and inspiratory wheezes. Pt was successful on PSV mode today. NSR on cardiac monitor. Levo is now at 9 mcgs/min. Will continue to monitor.

## 2015-06-13 NOTE — Consult Note (Signed)
MEDICATION RELATED CONSULT NOTE - INITIAL   Pharmacy Consult for Electrolyte Management/Constipation Prevention Indication: Hypophosphatemia, hypokalemia  No Known Allergies  Patient Measurements: Height: 5' 6"  (167.6 cm) Weight: 121 lb 11.1 oz (55.2 kg) IBW/kg (Calculated) : 59.3  Vital Signs: Temp: 98.2 F (36.8 C) (04/05 0400) Temp Source: Axillary (04/05 0400) BP: 93/68 mmHg (04/05 0700) Pulse Rate: 65 (04/05 0700) Intake/Output from previous day: 04/04 0701 - 04/05 0700 In: 2356 [I.V.:786; NG/GT:1420; IV Piggyback:150] Out: 550 [Urine:550] Intake/Output from this shift: Total I/O In: 6 [I.V.:6] Out: -   Labs:  Recent Labs  06/11/15 0419 06/11/15 1712 06/12/15 0524 06/12/15 1405 06/13/15 0505  WBC 18.2*  --   --   --   --   HGB 12.1  --   --   --   --   HCT 37.0  --   --   --   --   PLT 304  --   --   --   --   CREATININE  --   --   --  0.75 0.61  MG  --  1.9 1.8  --   --   PHOS  --  3.4 2.8  --   --   ALBUMIN  --   --   --  2.5*  --   PROT  --   --   --  5.6*  --   AST  --   --   --  32  --   ALT  --   --   --  39  --   ALKPHOS  --   --   --  57  --   BILITOT  --   --   --  0.8  --    Estimated Creatinine Clearance: 48.9 mL/min (by C-G formula based on Cr of 0.61).   Microbiology: Recent Results (from the past 720 hour(s))  Culture, blood (routine x 2)     Status: None   Collection Time: 06/07/15 11:32 AM  Result Value Ref Range Status   Specimen Description BLOOD LEFT ASSIST CONTROL  Final   Special Requests   Final    BOTTLES DRAWN AEROBIC AND ANAEROBIC [place]   Culture NO GROWTH 5 DAYS  Final   Report Status 06/12/2015 FINAL  Final  MRSA PCR Screening     Status: None   Collection Time: 06/08/15  1:00 AM  Result Value Ref Range Status   MRSA by PCR NEGATIVE NEGATIVE Final    Comment:        The GeneXpert MRSA Assay (FDA approved for NASAL specimens only), is one component of a comprehensive MRSA colonization surveillance  program. It is not intended to diagnose MRSA infection nor to guide or monitor treatment for MRSA infections.     Medical History: Past Medical History  Diagnosis Date  . Dementia   . COPD (chronic obstructive pulmonary disease) ([place])   . Essential hypertension     Medications:  Scheduled:  . antiseptic oral rinse  7 mL Mouth Rinse 10 times per day  . budesonide (PULMICORT) nebulizer solution  0.25 mg Nebulization BID  . chlorhexidine gluconate (SAGE KIT)  15 mL Mouth Rinse BID  . donepezil  5 mg Oral QHS  . enoxaparin (LOVENOX) injection  40 mg Subcutaneous Q24H  . free water  200 mL Per Tube 3 times per day  . ipratropium-albuterol  3 mL Nebulization Q6H  . nicotine  21 mg Transdermal Daily  . pantoprazole sodium  40  mg Per Tube Q1200  . piperacillin-tazobactam (ZOSYN)  IV  3.375 g Intravenous 3 times per day  . potassium chloride  20 mEq Per Tube BID  . senna-docusate  1 tablet Oral BID  . sodium chloride flush  3 mL Intravenous Q12H    Assessment: Leah [name] is an 82 yo female presenting with AMS, fall, and found pneumonia. Pharmacy consulted to manage electrolytes and constipation prevention in this patient.  Plan:  1. Next electrolytes scheduled for 4/6.   2.  NO BM so will increase senna-docusate to 2 bid.   Ulice Dash, PharmD Clinical Pharmacist   06/13/2015,12:56 PM

## 2015-06-14 LAB — BASIC METABOLIC PANEL
Anion gap: 6 (ref 5–15)
BUN: 18 mg/dL (ref 6–20)
CO2: 33 mmol/L — ABNORMAL HIGH (ref 22–32)
Calcium: 7.9 mg/dL — ABNORMAL LOW (ref 8.9–10.3)
Chloride: 95 mmol/L — ABNORMAL LOW (ref 101–111)
Creatinine, Ser: 0.58 mg/dL (ref 0.44–1.00)
GFR calc Af Amer: >60 mL/min (ref >60)
GFR calc non Af Amer: >60 mL/min (ref >60)
GLUCOSE: 172 mg/dL — AB (ref 65–99)
Potassium: 3.8 mmol/L (ref 3.5–5.1)
SODIUM: 134 mmol/L — AB (ref 135–145)

## 2015-06-14 LAB — MAGNESIUM
MAGNESIUM: 1.5 mg/dL — AB (ref 1.7–2.4)
MAGNESIUM: 2.3 mg/dL (ref 1.7–2.4)

## 2015-06-14 LAB — CBC
HCT: 33.8 % — ABNORMAL LOW (ref 35.0–47.0)
HEMOGLOBIN: 11.3 g/dL — AB (ref 12.0–16.0)
MCH: 29.2 pg (ref 26.0–34.0)
MCHC: 33.5 g/dL (ref 32.0–36.0)
MCV: 87.3 fL (ref 80.0–100.0)
Platelets: 277 10*3/uL (ref 150–440)
RBC: 3.87 MIL/uL (ref 3.80–5.20)
RDW: 14 % (ref 11.5–14.5)
WBC: 15.9 10*3/uL — ABNORMAL HIGH (ref 3.6–11.0)

## 2015-06-14 LAB — GLUCOSE, CAPILLARY
GLUCOSE-CAPILLARY: 175 mg/dL — AB (ref 65–99)
Glucose-Capillary: 187 mg/dL — ABNORMAL HIGH (ref 65–99)
Glucose-Capillary: 194 mg/dL — ABNORMAL HIGH (ref 65–99)
Glucose-Capillary: 149 mg/dL — ABNORMAL HIGH (ref 65–99)
Glucose-Capillary: 145 mg/dL — ABNORMAL HIGH (ref 65–99)

## 2015-06-14 LAB — PHOSPHORUS: Phosphorus: 2.5 mg/dL (ref 2.5–4.6)

## 2015-06-14 MED ORDER — INSULIN ASPART 100 UNIT/ML ~~LOC~~ SOLN: 0.0000 [IU] | Freq: Every day | SUBCUTANEOUS | Status: DC

## 2015-06-14 MED ORDER — MAGNESIUM SULFATE 4 GM/100ML IV SOLN
4.0000 g | Freq: Once | INTRAVENOUS | Status: AC
Start: 2015-06-14 — End: 2015-06-14
  Administered 2015-06-14: 4 g via INTRAVENOUS
  Filled 2015-06-14: qty 100

## 2015-06-14 MED ORDER — INSULIN ASPART 100 UNIT/ML ~~LOC~~ SOLN
0.0000 [IU] | Freq: Three times a day (TID) | SUBCUTANEOUS | Status: DC
  Administered 2015-06-14: 3 [IU] via SUBCUTANEOUS
  Administered 2015-06-14: 2 [IU] via SUBCUTANEOUS
  Administered 2015-06-15 (×2): 3 [IU] via SUBCUTANEOUS
  Administered 2015-06-16: 2 [IU] via SUBCUTANEOUS
  Administered 2015-06-16 (×2): 5 [IU] via SUBCUTANEOUS
  Filled 2015-06-14: qty 3
  Filled 2015-06-14: qty 5
  Filled 2015-06-14: qty 3
  Filled 2015-06-14 (×3): qty 2
  Filled 2015-06-14: qty 3
  Filled 2015-06-14: qty 2

## 2015-06-14 MED ORDER — BISACODYL 10 MG RE SUPP
10.0000 mg | Freq: Once | RECTAL | Status: AC
  Administered 2015-06-14: 10 mg via RECTAL
  Filled 2015-06-14: qty 1

## 2015-06-14 NOTE — Consult Note (Signed)
MEDICATION RELATED CONSULT NOTE - INITIAL   Pharmacy Consult for Electrolyte Management/Constipation Prevention Indication: Hypophosphatemia, hypokalemia  No Known Allergies  Patient Measurements: Height: 5' 6"  (167.6 cm) Weight: 123 lb 14.4 oz (56.2 kg) IBW/kg (Calculated) : 59.3  Vital Signs: Temp: 98.8 F (37.1 C) (04/06 0500) Temp Source: Oral (04/06 0500) BP: 92/70 mmHg (04/06 0600) Pulse Rate: 84 (04/06 0500) Intake/Output from previous day: 04/05 0701 - 04/06 0700 In: 1253.3 [I.V.:53.3; NG/GT:1150; IV Piggyback:50] Out: 1900 [Urine:1900] Intake/Output from this shift:    Labs:  Recent Labs  06/11/15 1712 06/12/15 0524 06/12/15 1405 06/13/15 0505 06/14/15 0445  WBC  --   --   --   --  15.9*  HGB  --   --   --   --  11.3*  HCT  --   --   --   --  33.8*  PLT  --   --   --   --  277  CREATININE  --   --  0.75 0.61 0.58  MG 1.9 1.8  --   --  1.5*  PHOS 3.4 2.8  --   --  2.5  ALBUMIN  --   --  2.5*  --   --   PROT  --   --  5.6*  --   --   AST  --   --  32  --   --   ALT  --   --  39  --   --   ALKPHOS  --   --  57  --   --   BILITOT  --   --  0.8  --   --    Estimated Creatinine Clearance: 49.8 mL/min (by C-G formula based on Cr of 0.58).   Microbiology: Recent Results (from the past 720 hour(s))  Culture, blood (routine x 2)     Status: None   Collection Time: 06/07/15 11:32 AM  Result Value Ref Range Status   Specimen Description BLOOD LEFT ASSIST CONTROL  Final   Special Requests   Final    BOTTLES DRAWN AEROBIC AND ANAEROBIC [place]   Culture NO GROWTH 5 DAYS  Final   Report Status 06/12/2015 FINAL  Final  MRSA PCR Screening     Status: None   Collection Time: 06/08/15  1:00 AM  Result Value Ref Range Status   MRSA by PCR NEGATIVE NEGATIVE Final    Comment:        The GeneXpert MRSA Assay (FDA approved for NASAL specimens only), is one component of a comprehensive MRSA colonization surveillance program. It is not intended to  diagnose MRSA infection nor to guide or monitor treatment for MRSA infections.     Medical History: Past Medical History  Diagnosis Date  . Dementia   . COPD (chronic obstructive pulmonary disease) ([place])   . Essential hypertension     Medications:  Scheduled:  . antiseptic oral rinse  7 mL Mouth Rinse 10 times per day  . budesonide (PULMICORT) nebulizer solution  0.25 mg Nebulization BID  . chlorhexidine gluconate (SAGE KIT)  15 mL Mouth Rinse BID  . donepezil  5 mg Oral QHS  . enoxaparin (LOVENOX) injection  40 mg Subcutaneous Q24H  . free water  200 mL Per Tube 3 times per day  . ipratropium-albuterol  3 mL Nebulization Q6H  . magnesium sulfate 1 - 4 g bolus IVPB  4 g Intravenous Once  . nicotine  21 mg Transdermal Daily  . pantoprazole  sodium  40 mg Per Tube Q1200  . piperacillin-tazobactam (ZOSYN)  IV  3.375 g Intravenous 3 times per day  . potassium chloride  20 mEq Per Tube BID  . senna-docusate  2 tablet Oral BID  . sodium chloride flush  3 mL Intravenous Q12H    Assessment: Leah [name] is an 82 yo female presenting with AMS, fall, and found pneumonia. Pharmacy consulted to manage electrolytes and constipation prevention in this patient.  Plan:  1. Mag 1.5 this AM, K/Phos WNL. Will give Mag 4g IV x1 and recheck Mag at 1800.   2.  NO BM yet. Increased senna-docusate to 2 bid BID yesterday. Will discuss on rounds.   Roe Coombs, PharmD Pharmacy Resident 06/14/2015

## 2015-06-14 NOTE — Consult Note (Signed)
MEDICATION RELATED CONSULT NOTE - Follow up  Pharmacy Consult for Electrolyte Management/Constipation Prevention Indication: Hypophosphatemia, hypokalemia  No Known Allergies  Patient Measurements: Height: 5\' 6"  (167.6 cm) Weight: 123 lb 14.4 oz (56.2 kg) IBW/kg (Calculated) : 59.3  Vital Signs: Temp: 99 F (37.2 C) (04/06 1600) Temp Source: Axillary (04/06 1600) BP: 121/55 mmHg (04/06 1800) Pulse Rate: 79 (04/06 1800) Intake/Output from previous day: 04/05 0701 - 04/06 0700 In: 1253.3 [I.V.:53.3; NG/GT:1150; IV Piggyback:50] Out: 1900 [Urine:1900] Intake/Output from this shift:    Labs:  Recent Labs  06/12/15 0524 06/12/15 1405 06/13/15 0505 06/14/15 0445 06/14/15 1850  WBC  --   --   --  15.9*  --   HGB  --   --   --  11.3*  --   HCT  --   --   --  33.8*  --   PLT  --   --   --  277  --   CREATININE  --  0.75 0.61 0.58  --   MG 1.8  --   --  1.5* 2.3  PHOS 2.8  --   --  2.5  --   ALBUMIN  --  2.5*  --   --   --   PROT  --  5.6*  --   --   --   AST  --  32  --   --   --   ALT  --  39  --   --   --   ALKPHOS  --  57  --   --   --   BILITOT  --  0.8  --   --   --    Estimated Creatinine Clearance: 49.8 mL/min (by C-G formula based on Cr of 0.58).  Assessment: Forde RadonJ is an 82 yo female presenting with AMS, fall, and found pneumonia. Pharmacy consulted to manage electrolytes and constipation prevention in this patient.  Plan:  1. Mag 1.5 this AM, K/Phos WNL. Will give Mag 4g IV x1 and recheck Mag at 1800.   2.  NO BM yet. Increased senna-docusate to 2 bid BID yesterday. Will discuss on rounds.    4/6 Evening Follow up: Mag 2.3, no supplementation needed F/u AM labs   Crist FatHannah Printice Hellmer, PharmD, BCPS Clinical Pharmacist 06/14/2015 7:51 PM

## 2015-06-14 NOTE — Progress Notes (Signed)
CC: follow up acute resp failure HPI: patient intubated, Remains critically ill, off sedation On high PS mode  RT IJ CVL 4/3>>>> ETT 9.0 4/3>>>>   Filed Vitals:   06/14/15 0500 06/14/15 0514 06/14/15 0600 06/14/15 0748  BP: 81/58  92/70   Pulse: 84     Temp: 98.8 F (37.1 C)     TempSrc: Oral     Resp: 22  23   Height:      Weight:  123 lb 14.4 oz (56.2 kg)    SpO2: 98%   99%   Review of Systems  Unable to perform ROS: critical illness    Physical Exam  Vitals reviewed. Constitutional: She appears toxic. She appears ill. No distress. She is sedated and intubated.  HENT:  Head: Normocephalic and atraumatic.  Mouth/Throat: Oropharynx is clear and moist.  Eyes: Pupils are equal, round, and reactive to light.  Neck: Normal range of motion. Neck supple. Carotid bruit is not present. No tracheal deviation present.  Cardiovascular: Normal rate, regular rhythm and normal heart sounds.   No murmur heard. Respiratory: She is intubated. No respiratory distress. She has no wheezes. She has rales.  Rhonchi  GI: Soft. Bowel sounds are normal.  Musculoskeletal: She exhibits no edema.  Neurological: She exhibits normal muscle tone. GCS eye subscore is 4. GCS verbal subscore is 5. GCS motor subscore is 6. She displays no Babinski's sign on the right side. She displays no Babinski's sign on the left side.  gcs<8T  Skin: Skin is warm. No rash noted. She is not diaphoretic. No cyanosis.       BMP Latest Ref Rng 06/14/2015 06/13/2015 06/12/2015  Glucose 65 - 99 mg/dL 409(W172(H) - 119(J162(H)  BUN 6 - 20 mg/dL 18 - 47(W28(H)  Creatinine 0.44 - 1.00 mg/dL 2.950.58 6.210.61 3.080.75  Sodium 135 - 145 mmol/L 134(L) - 142  Potassium 3.5 - 5.1 mmol/L 3.8 - 4.1  Chloride 101 - 111 mmol/L 95(L) - 106  CO2 22 - 32 mmol/L 33(H) - 35(H)  Calcium 8.9 - 10.3 mg/dL 7.9(L) - 8.4(L)    CBC Latest Ref Rng 06/14/2015 06/11/2015 06/10/2015  WBC 3.6 - 11.0 K/uL 15.9(H) 18.2(H) 24.1(H)  Hemoglobin 12.0 - 16.0 g/dL 11.3(L) 12.1 11.3(L)   Hematocrit 35.0 - 47.0 % 33.8(L) 37.0 35.0  Platelets 150 - 440 K/uL 277 304 306    CXR: increased RLL opacity  IMPRESSION:82 yo white female with acute resp failure from acute COPD exacerbation and acute pneumonia  1.Respiratory Failure -continue Full MV support -continue Bronchodilator Therapy -Wean Fio2 and PEEP as tolerated  2.COPD exacerbation -IV steroids, IV abx -aggressive BD therapy  3.COnt Nutrition 4.DVT and GI prx  After discussion with Son, will make patient DNR. WIll continue to wean vent as tolerated and discuss goals of care with son  I have personally obtained a history, examined the patient, evaluated Pertinent laboratory and RadioGraphic/imaging results, and  formulated the assessment and plan   The Patient requires high complexity decision making for assessment and support, frequent evaluation and titration of therapies, application of advanced monitoring technologies and extensive interpretation of multiple databases. Critical Care Time devoted to patient care services described in this note is 35 minutes.   Overall, patient is critically ill, prognosis is guarded.  Patient with Multiorgan failure and at high risk for cardiac arrest and death.    Lucie LeatherKurian David Senan Urey, M.D.  [facility]  Medical Director [facility] Medical Director [facility] Card[facility]

## 2015-06-15 LAB — GLUCOSE, CAPILLARY
GLUCOSE-CAPILLARY: 162 mg/dL — AB (ref 65–99)
GLUCOSE-CAPILLARY: 178 mg/dL — AB (ref 65–99)
GLUCOSE-CAPILLARY: 152 mg/dL — AB (ref 65–99)
Glucose-Capillary: 118 mg/dL — ABNORMAL HIGH (ref 65–99)
Glucose-Capillary: 160 mg/dL — ABNORMAL HIGH (ref 65–99)
Glucose-Capillary: 180 mg/dL — ABNORMAL HIGH (ref 65–99)

## 2015-06-15 LAB — BASIC METABOLIC PANEL
Anion gap: 3 — ABNORMAL LOW (ref 5–15)
BUN: 19 mg/dL (ref 6–20)
CO2: 35 mmol/L — AB (ref 22–32)
Calcium: 7.8 mg/dL — ABNORMAL LOW (ref 8.9–10.3)
Chloride: 95 mmol/L — ABNORMAL LOW (ref 101–111)
Creatinine, Ser: 0.55 mg/dL (ref 0.44–1.00)
GFR calc Af Amer: >60 mL/min (ref >60)
GLUCOSE: 168 mg/dL — AB (ref 65–99)
POTASSIUM: 4.1 mmol/L (ref 3.5–5.1)
Sodium: 133 mmol/L — ABNORMAL LOW (ref 135–145)

## 2015-06-15 LAB — MAGNESIUM: Magnesium: 1.9 mg/dL (ref 1.7–2.4)

## 2015-06-15 MED ORDER — LACTULOSE 10 GM/15ML PO SOLN
20.0000 g | Freq: Two times a day (BID) | ORAL | Status: DC | PRN
  Administered 2015-06-15: 20 g via ORAL
  Filled 2015-06-15: qty 30

## 2015-06-15 MED ORDER — ALPRAZOLAM 1 MG PO TABS
1.0000 mg | ORAL_TABLET | Freq: Three times a day (TID) | ORAL | Status: DC
  Administered 2015-06-15 – 2015-06-18 (×8): 1 mg via ORAL
  Filled 2015-06-15 (×8): qty 1

## 2015-06-15 MED ORDER — VITAL HIGH PROTEIN PO LIQD
1000.0000 mL | ORAL | Status: DC
  Administered 2015-06-15 – 2015-06-16 (×2): 1000 mL

## 2015-06-15 MED ORDER — METHYLPREDNISOLONE SODIUM SUCC 40 MG IJ SOLR
20.0000 mg | Freq: Two times a day (BID) | INTRAMUSCULAR | Status: DC
  Administered 2015-06-15 – 2015-06-17 (×6): 20 mg via INTRAVENOUS
  Filled 2015-06-15 (×6): qty 1

## 2015-06-15 NOTE — Consult Note (Addendum)
MEDICATION RELATED CONSULT NOTE - INITIAL   Pharmacy Consult for Electrolyte Management/Constipation Prevention Indication: Hypophosphatemia, hypokalemia  No Known Allergies  Patient Measurements: Height: '5\' 6"'$  (167.6 cm) Weight: 125 lb 7.1 oz (56.9 kg) IBW/kg (Calculated) : 59.3  Vital Signs: Temp: 99.9 F (37.7 C) (04/07 0700) Temp Source: Axillary (04/07 0700) BP: 116/54 mmHg (04/07 0800) Pulse Rate: 95 (04/07 0800) Intake/Output from previous day: 04/06 0701 - 04/07 0700 In: 3640 [I.V.:2140; NG/GT:1250; IV Piggyback:250] Out: 2650 [Urine:2650] Intake/Output from this shift: Total I/O In: 50 [NG/GT:50] Out: -   Labs:  Recent Labs  06/12/15 1405 06/13/15 0505 06/14/15 0445 06/14/15 1850 06/15/15 0521  WBC  --   --  15.9*  --   --   HGB  --   --  11.3*  --   --   HCT  --   --  33.8*  --   --   PLT  --   --  277  --   --   CREATININE 0.75 0.61 0.58  --  0.55  MG  --   --  1.5* 2.3 1.9  PHOS  --   --  2.5  --   --   ALBUMIN 2.5*  --   --   --   --   PROT 5.6*  --   --   --   --   AST 32  --   --   --   --   ALT 39  --   --   --   --   ALKPHOS 57  --   --   --   --   BILITOT 0.8  --   --   --   --    Estimated Creatinine Clearance: 50.4 mL/min (by C-G formula based on Cr of 0.55).   Microbiology: Recent Results (from the past 720 hour(s))  Culture, blood (routine x 2)     Status: None   Collection Time: 06/07/15 11:32 AM  Result Value Ref Range Status   Specimen Description BLOOD LEFT ASSIST CONTROL  Final   Special Requests   Final    BOTTLES DRAWN AEROBIC AND ANAEROBIC [place]   Culture NO GROWTH 5 DAYS  Final   Report Status 06/12/2015 FINAL  Final  MRSA PCR Screening     Status: None   Collection Time: 06/08/15  1:00 AM  Result Value Ref Range Status   MRSA by PCR NEGATIVE NEGATIVE Final    Comment:        The GeneXpert MRSA Assay (FDA approved for NASAL specimens only), is one component of a comprehensive MRSA  colonization surveillance program. It is not intended to diagnose MRSA infection nor to guide or monitor treatment for MRSA infections.     Medical History: Past Medical History  Diagnosis Date  . Dementia   . COPD (chronic obstructive pulmonary disease) ([place])   . Essential hypertension     Medications:  Scheduled:  . antiseptic oral rinse  7 mL Mouth Rinse 10 times per day  . budesonide (PULMICORT) nebulizer solution  0.25 mg Nebulization BID  . chlorhexidine gluconate (SAGE KIT)  15 mL Mouth Rinse BID  . donepezil  5 mg Oral QHS  . enoxaparin (LOVENOX) injection  40 mg Subcutaneous Q24H  . free water  200 mL Per Tube 3 times per day  . insulin aspart  0-15 Units Subcutaneous TID WC  . insulin aspart  0-5 Units Subcutaneous QHS  . ipratropium-albuterol  3 mL Nebulization  Q6H  . nicotine  21 mg Transdermal Daily  . pantoprazole sodium  40 mg Per Tube Q1200  . piperacillin-tazobactam (ZOSYN)  IV  3.375 g Intravenous 3 times per day  . potassium chloride  20 mEq Per Tube BID  . senna-docusate  2 tablet Oral BID  . sodium chloride flush  3 mL Intravenous Q12H    Assessment: Leah [name] is an 82 yo female presenting with AMS, fall, and found pneumonia. Pharmacy consulted to manage electrolytes and constipation prevention in this patient.  Plan:  1. Electrolytes are WNL. Will f/u AM labs.    2. Patient still has not had a BM yet. Continue senna-docusate 2 tabs po bid. Pt is s/p bisacodyl supp 39m x1. Will give lactulose 251mBID prn.  AlRoe CoombsPharmD Pharmacy Resident 06/15/2015

## 2015-06-15 NOTE — Care Management (Signed)
Placed another order for palliative care for goals of treatment now that patient is a dnr and remains on full ventilator support

## 2015-06-15 NOTE — Progress Notes (Signed)
CC: follow up acute resp failure HPI: patient intubated, Remains critically ill, off sedation Had to be placed on PRVC mode, failed PS mode  RT IJ CVL 4/3>>>> ETT 9.0 4/3>>>>   Filed Vitals:   06/15/15 0300 06/15/15 0400 06/15/15 0500 06/15/15 0600  BP: 121/51 112/53 108/51 108/60  Pulse: 114 116 113 110  Temp:  98.1 F (36.7 C)    TempSrc:  Axillary    Resp: 28 28 32 28  Height:      Weight:   125 lb 7.1 oz (56.9 kg)   SpO2: 99% 99% 99% 100%   Review of Systems  Unable to perform ROS: critical illness    Physical Exam  Vitals reviewed. Constitutional: She appears toxic. She appears ill. No distress. She is sedated and intubated.  HENT:  Head: Normocephalic and atraumatic.  Mouth/Throat: Oropharynx is clear and moist.  Eyes: Pupils are equal, round, and reactive to light.  Neck: Normal range of motion. Neck supple. Carotid bruit is not present. No tracheal deviation present.  Cardiovascular: Normal rate, regular rhythm and normal heart sounds.   No murmur heard. Respiratory: She is intubated. No respiratory distress. She has no wheezes. She has rales.  Rhonchi  GI: Soft. Bowel sounds are normal.  Musculoskeletal: She exhibits no edema.  Neurological: She exhibits normal muscle tone. GCS eye subscore is 4. GCS verbal subscore is 5. GCS motor subscore is 6. She displays no Babinski's sign on the right side. She displays no Babinski's sign on the left side.  gcs<8T  Skin: Skin is warm. No rash noted. She is not diaphoretic. No cyanosis.       BMP Latest Ref Rng 06/15/2015 06/14/2015 06/13/2015  Glucose 65 - 99 mg/dL 161(W168(H) 960(A172(H) -  BUN 6 - 20 mg/dL 19 18 -  Creatinine 5.400.44 - 1.00 mg/dL 9.810.55 1.910.58 4.780.61  Sodium 135 - 145 mmol/L 133(L) 134(L) -  Potassium 3.5 - 5.1 mmol/L 4.1 3.8 -  Chloride 101 - 111 mmol/L 95(L) 95(L) -  CO2 22 - 32 mmol/L 35(H) 33(H) -  Calcium 8.9 - 10.3 mg/dL 7.8(L) 7.9(L) -    CBC Latest Ref Rng 06/14/2015 06/11/2015 06/10/2015  WBC 3.6 - 11.0 K/uL  15.9(H) 18.2(H) 24.1(H)  Hemoglobin 12.0 - 16.0 g/dL 11.3(L) 12.1 11.3(L)  Hematocrit 35.0 - 47.0 % 33.8(L) 37.0 35.0  Platelets 150 - 440 K/uL 277 304 306    CXR: increased RLL opacity  IMPRESSION:82 yo white female with acute resp failure from acute COPD exacerbation and acute pneumonia  1.Respiratory Failure -continue Full MV support -continue Bronchodilator Therapy -Wean Fio2 and PEEP as tolerated  2.COPD exacerbation -IV steroids, IV abx -aggressive BD therapy  3.COnt Nutrition 4.DVT and GI prx  After discussion with Son, will make patient DNR/DNI. WIll continue to wean vent as tolerated and discuss goals of care with son Once patient is extubated, will NOT re-intubate  I have personally obtained a history, examined the patient, evaluated Pertinent laboratory and RadioGraphic/imaging results, and  formulated the assessment and plan   The Patient requires high complexity decision making for assessment and support, frequent evaluation and titration of therapies, application of advanced monitoring technologies and extensive interpretation of multiple databases. Critical Care Time devoted to patient care services described in this note is 35 minutes.   Overall, patient is critically ill, prognosis is guarded.  Patient with Multiorgan failure and at high risk for cardiac arrest and death.    Lucie LeatherKurian David Gean Laursen, M.D.  [facility]  [facility]  Medical Director [place] Director [facility] Cardio-Pulmonary Department

## 2015-06-15 NOTE — Progress Notes (Signed)
Nutrition Follow-up    INTERVENTION:   EN: nutritional needs reassessed. Recommend increasing TF to goal of 60 ml/hr; meets 100% estimated calorie and protein needs. Continue to assess Nutrition Related Medication Management: discussed constipation during ICU rounds, bowel regimen being adjusted. Pt may benefit from suppository or enema. Continue to assess   NUTRITION DIAGNOSIS:   Inadequate oral intake related to acute illness, poor appetite as evidenced by NPO status, meal completion < 25%. Being addressed via TF  GOAL:   Provide needs based on ASPEN/SCCM guidelines  MONITOR:   Vent status, Labs, Weight trends, TF tolerance, I & O's  REASON FOR ASSESSMENT:   Consult Assessment of nutrition requirement/status  ASSESSMENT:   [age] uo female admitted with AMS, acute respiratory failure with pneumonia, COPD with acute bronchospasm, admitted 06/07/15, required intubation on 06/11/15  Patient is currently intubated on ventilator support, off levophed MV: 11.7 L/min Temp (24hrs), Avg:98.8 F (37.1 C), Min:98.1 F (36.7 C), Max:99.9 F (37.7 C)  Diet Order:  Diet NPO time specified   EN: tolerating Vital High Protein at rate of 50 ml/hr  Skin:  Reviewed, no issues  Last BM:  no BM documented since admission (8 days)   Meds: ss novolog, fentanyl, lactulose  Height:   Ht Readings from Last 1 Encounters:  06/11/15 5\' 6"  (1.676 m)    Weight:   Wt Readings from Last 1 Encounters:  06/15/15 125 lb 7.1 oz (56.9 kg)   Filed Weights   06/13/15 0500 06/14/15 0514 06/15/15 0500  Weight: 121 lb 11.1 oz (55.2 kg) 123 lb 14.4 oz (56.2 kg) 125 lb 7.1 oz (56.9 kg)    BMI:  Body mass index is 20.26 kg/(m^2).  Estimated Nutritional Needs:   Kcal:  1460 kcals (Ve: 11.7, Tmax: 37.7) using wt of 56.9 kg  Protein:  84-112 g (1.5-2.0 g/kg)   Fluid:  >1.5 L  EDUCATION NEEDS:   Education needs no appropriate at this time  Romelle StarcherCate Les Longmore MS, RD, LDN [phone] Pager  [phone]  [phone] Weekend/On-Call Pager

## 2015-06-15 NOTE — Progress Notes (Signed)
Patient has been in SBT since 1158, eventually weaned from 20/5 to 10/5 before patient respiratory rate increased to mid 40's and patient was placed back on PRVC at previous settings.

## 2015-06-16 ENCOUNTER — Inpatient Hospital Stay: Payer: Medicare Other

## 2015-06-16 LAB — GLUCOSE, CAPILLARY
GLUCOSE-CAPILLARY: 134 mg/dL — AB (ref 65–99)
GLUCOSE-CAPILLARY: 138 mg/dL — AB (ref 65–99)
GLUCOSE-CAPILLARY: 175 mg/dL — AB (ref 65–99)
GLUCOSE-CAPILLARY: 219 mg/dL — AB (ref 65–99)
GLUCOSE-CAPILLARY: 234 mg/dL — AB (ref 65–99)
Glucose-Capillary: 147 mg/dL — ABNORMAL HIGH (ref 65–99)
Glucose-Capillary: 146 mg/dL — ABNORMAL HIGH (ref 65–99)

## 2015-06-16 LAB — BASIC METABOLIC PANEL
ANION GAP: 7 (ref 5–15)
BUN: 22 mg/dL — ABNORMAL HIGH (ref 6–20)
CALCIUM: 8.3 mg/dL — AB (ref 8.9–10.3)
CO2: 33 mmol/L — ABNORMAL HIGH (ref 22–32)
Chloride: 97 mmol/L — ABNORMAL LOW (ref 101–111)
Creatinine, Ser: 0.61 mg/dL (ref 0.44–1.00)
Glucose, Bld: 245 mg/dL — ABNORMAL HIGH (ref 65–99)
Potassium: 4.4 mmol/L (ref 3.5–5.1)
Sodium: 137 mmol/L (ref 135–145)

## 2015-06-16 LAB — MAGNESIUM: MAGNESIUM: 2 mg/dL (ref 1.7–2.4)

## 2015-06-16 LAB — PHOSPHORUS: PHOSPHORUS: 3.7 mg/dL (ref 2.5–4.6)

## 2015-06-16 MED ORDER — BISACODYL 10 MG RE SUPP
10.0000 mg | Freq: Once | RECTAL | Status: AC
  Administered 2015-06-16: 10 mg via RECTAL
  Filled 2015-06-16: qty 1

## 2015-06-16 NOTE — Plan of Care (Signed)
Problem: Safety: Goal: Ability to remain free from injury will improve Outcome: Progressing Pt free from falls, mittens remain in place. Pt reaches for ET tube at times.  Problem: Pain Managment: Goal: General experience of comfort will improve Outcome: Progressing Pt with no signs of pain, CPOT scale used.   Problem: Physical Regulation: Goal: Ability to maintain clinical measurements within normal limits will improve Outcome: Progressing Pt with active UE ROM, passive ROM done to LE. Goal: Will remain free from infection Outcome: Progressing Pt afebrile, will cont to monitor WBC.   Problem: Skin Integrity: Goal: Risk for impaired skin integrity will decrease Outcome: Progressing Pt turned q2hours, oral care done per protocol.   Problem: Tissue Perfusion: Goal: Risk factors for ineffective tissue perfusion will decrease Outcome: Progressing Pt on lovenox  Problem: Fluid Volume: Goal: Ability to maintain a balanced intake and output will improve Outcome: Progressing Monitor I&Os, foley remains in place.   Problem: Nutrition: Goal: Adequate nutrition will be maintained Outcome: Progressing TF at goal.  Problem: Bowel/Gastric: Goal: Will not experience complications related to bowel motility Outcome: Not Progressing No BM since admit, pt started on bowel regimen.   Problem: Activity: Goal: Ability to implement measures to reduce episodes of fatigue will improve Outcome: Progressing Pt remains on vent and sedated overnight.

## 2015-06-16 NOTE — Progress Notes (Signed)
Sedation off since 0730, patient arousable to voice, following commands. Mungal notified and ordered cuff leak test, pending. Patient removed OG tube, tube feeding off, will reinsert pending resolution of sedation wean/cuff test.

## 2015-06-16 NOTE — Consult Note (Addendum)
MEDICATION RELATED CONSULT NOTE - follow up  Pharmacy Consult for Electrolyte Management/Constipation Prevention Indication: Hypophosphatemia, hypokalemia  No Known Allergies  Patient Measurements: Height: 5' 6"  (167.6 cm) Weight: 126 lb 5.2 oz (57.3 kg) IBW/kg (Calculated) : 59.3  Vital Signs: BP: 120/47 mmHg (04/08 1300) Pulse Rate: 65 (04/08 1300) Intake/Output from previous day: 04/07 0701 - 04/08 0700 In: 2258.4 [I.V.:588.4; NG/GT:1520; IV Piggyback:150] Out: 2200 [Urine:2200] Intake/Output from this shift: Total I/O In: 447 [I.V.:198; NG/GT:199; IV Piggyback:50] Out: 290 [Urine:290]  Labs:  Recent Labs  06/14/15 0445 06/14/15 1850 06/15/15 0521 06/16/15 0447  WBC 15.9*  --   --   --   HGB 11.3*  --   --   --   HCT 33.8*  --   --   --   PLT 277  --   --   --   CREATININE 0.58  --  0.55 0.61  MG 1.5* 2.3 1.9 2.0  PHOS 2.5  --   --  3.7   Estimated Creatinine Clearance: 50.7 mL/min (by C-G formula based on Cr of 0.61).   Microbiology: Recent Results (from the past 720 hour(s))  Culture, blood (routine x 2)     Status: None   Collection Time: 06/07/15 11:32 AM  Result Value Ref Range Status   Specimen Description BLOOD LEFT ASSIST CONTROL  Final   Special Requests   Final    BOTTLES DRAWN AEROBIC AND ANAEROBIC [place]   Culture NO GROWTH 5 DAYS  Final   Report Status 06/12/2015 FINAL  Final  MRSA PCR Screening     Status: None   Collection Time: 06/08/15  1:00 AM  Result Value Ref Range Status   MRSA by PCR NEGATIVE NEGATIVE Final    Comment:        The GeneXpert MRSA Assay (FDA approved for NASAL specimens only), is one component of a comprehensive MRSA colonization surveillance program. It is not intended to diagnose MRSA infection nor to guide or monitor treatment for MRSA infections.     Medical History: Past Medical History  Diagnosis Date  . Dementia   . COPD (chronic obstructive pulmonary disease) ([place])   . Essential  hypertension     Medications:  Scheduled:  . ALPRAZolam  1 mg Oral TID  . antiseptic oral rinse  7 mL Mouth Rinse 10 times per day  . bisacodyl  10 mg Rectal Once  . budesonide (PULMICORT) nebulizer solution  0.25 mg Nebulization BID  . chlorhexidine gluconate (SAGE KIT)  15 mL Mouth Rinse BID  . donepezil  5 mg Oral QHS  . enoxaparin (LOVENOX) injection  40 mg Subcutaneous Q24H  . free water  200 mL Per Tube 3 times per day  . insulin aspart  0-15 Units Subcutaneous TID WC  . insulin aspart  0-5 Units Subcutaneous QHS  . ipratropium-albuterol  3 mL Nebulization Q6H  . methylPREDNISolone (SOLU-MEDROL) injection  20 mg Intravenous Q12H  . nicotine  21 mg Transdermal Daily  . pantoprazole sodium  40 mg Per Tube Q1200  . piperacillin-tazobactam (ZOSYN)  IV  3.375 g Intravenous 3 times per day  . potassium chloride  20 mEq Per Tube BID  . senna-docusate  2 tablet Oral BID  . sodium chloride flush  3 mL Intravenous Q12H    Assessment: Leah [name] is an 82 yo female presenting with AMS, fall, and found pneumonia. Pharmacy consulted to manage electrolytes and constipation prevention in this patient.  Plan:  1. Electrolytes are WNL. Will f/u  AM labs. Patient on KCL per tube 25mq bid   2. Patient still has not had a BM yet. Continue senna-docusate 2 tabs po bid. Pt is s/p bisacodyl supp 169mx1 on 4/6.   lactulose 2077mID prn added 4/7. Bisacodyl supp x 1. (on continuous TubeFeeds)  KriChinita GreenlandarmD Clinical Pharmacist 06/16/2015

## 2015-06-16 NOTE — Progress Notes (Signed)
CC: follow up acute resp failure HPI: patient intubated, Remains critically ill, off sedation follow simple commands, grab hand, weak cough, more awake but still somnolent and lethargic, tolerating PSV mode today.   RT IJ CVL 4/3>>>> ETT 9.0 4/3>>>>   Filed Vitals:   06/16/15 0900 06/16/15 1000 06/16/15 1100 06/16/15 1200  BP: 91/76 124/54 110/96 123/48  Pulse: 81 65 97 65  Temp:      TempSrc:      Resp: Height:      Weight:      SpO2: 100% 94% 100% 100%   Review of Systems  Unable to perform ROS: critical illness    Physical Exam  Vitals reviewed. Constitutional: She appears toxic. She appears ill. No distress. She is sedated and intubated.  HENT:  Head: Normocephalic and atraumatic.  Mouth/Throat: Oropharynx is clear and moist.  Eyes: Pupils are equal, round, and reactive to light.  Neck: Normal range of motion. Neck supple. Carotid bruit is not present. No tracheal deviation present.  Cardiovascular: Normal rate, regular rhythm and normal heart sounds.   No murmur heard. Respiratory: She is intubated. No respiratory distress. She has no wheezes.  Mild Rhonchi  GI: Soft. Bowel sounds are normal.  Musculoskeletal: She exhibits no edema.  Neurological: She exhibits normal muscle tone. GCS eye subscore is 4. GCS verbal subscore is 5. GCS motor subscore is 6. She displays no Babinski's sign on the right side. She displays no Babinski's sign on the left side.  gcs<10T  Skin: Skin is warm. No rash noted. She is not diaphoretic. No cyanosis.       BMP Latest Ref Rng 06/16/2015 06/15/2015 06/14/2015  Glucose 65 - 99 mg/dL 161(W) 960(A) 540(J)  BUN 6 - 20 mg/dL 81(X) 19 18  Creatinine 0.44 - 1.00 mg/dL 9.14 7.82 9.56  Sodium 135 - 145 mmol/L 137 133(L) 134(L)  Potassium 3.5 - 5.1 mmol/L 4.4 4.1 3.8  Chloride 101 - 111 mmol/L 97(L) 95(L) 95(L)  CO2 22 - 32 mmol/L 33(H) 35(H) 33(H)  Calcium 8.9 - 10.3 mg/dL 8.3(L) 7.8(L) 7.9(L)    CBC Latest Ref Rng 06/14/2015  06/11/2015 06/10/2015  WBC 3.6 - 11.0 K/uL 15.9(H) 18.2(H) 24.1(H)  Hemoglobin 12.0 - 16.0 g/dL 11.3(L) 12.1 11.3(L)  Hematocrit 35.0 - 47.0 % 33.8(L) 37.0 35.0  Platelets 150 - 440 K/uL 277 304 306    CXR: increased RLL opacity  IMPRESSION:82 yo white female with acute resp failure from acute COPD exacerbation and acute pneumonia  1.Respiratory Failure -continue Full MV support -continue Bronchodilator Therapy -Wean Fio2 and PEEP as tolerated -plan for PSV as tolerated today, then PRVC tonight, start SAT/SBT in the AM and possible extubation in the next 24 hours. Would like patient to be more awake/alert, with stronger cough, since this is a one way extubation planned.   2.COPD exacerbation -IV steroids, IV abx -aggressive BD therapy  3.COnt Nutrition 4.DVT and GI prx  DNR/DNI. WIll continue to wean vent as tolerated and discuss goals of care with son Once patient is extubated, will NOT re-intubate  Son at bedside updated on plan of care.   I have personally obtained a history, examined the patient, evaluated Pertinent laboratory and Radio[employer]/imaging results, and  formulated the assessment and plan   The Patient requires high complexity decision making for assessment and support, frequent evaluation and titration of therapies, application of advanced monitoring technologies and extensive interpretation of multiple databases. Critical Care Time devoted to patient care services described  in this note is 35 minutes.   Overall, patient is critically ill, prognosis is guarded.  Patient with Multiorgan failure and at high risk for cardiac arrest and death.   Stephanie AcreVishal Keimani Laufer, MD [place] [facility] Pager [phone] (please enter 7-digits) On Call Pager - [phone] (please enter 7-digits)

## 2015-06-16 NOTE — Progress Notes (Signed)
Pharmacy Antibiotic Note  Leah Leah [name] is Leah [name] 82 y.o. female admitted on 06/07/2015 with pneumonia.  Pharmacy has been consulted for zosyn dosing.  Plan: Continue Zosyn 3.375 gm IV Q8H EI.  Height: 5\' 6"  (167.6 cm) Weight: 126 lb 5.2 oz (57.3 kg) IBW/kg (Calculated) : 59.3  Temp (24hrs), Avg:98.6 F (37 C), Min:98.3 F (36.8 C), Max:99 F (37.2 C)   Recent Labs Lab 06/10/15 0500 06/11/15 0419 06/12/15 1405 06/13/15 0505 06/14/15 0445 06/15/15 0521 06/16/15 0447  WBC 24.1* 18.2*  --   --  15.9*  --   --   CREATININE 0.63  --  0.75 0.61 0.58 0.55 0.61    Estimated Creatinine Clearance: 50.7 mL/min (by C-G formula based on Cr of 0.61).    No Known Allergies  Antibiotics:  Zosyn 3/30 >> Azithromycin 3/30 >> 3/30 Vancomycin 3/30 >>3/30  Microbiology: 3/31 MRSA PCR: negative 3/30 Blood cx: negative  Thank you for allowing pharmacy to be Leah [name] part of this patient's care.  Leah Leah [name], Pharm.D., BCPS Clinical Pharmacist 06/16/2015 1:54 PM

## 2015-06-16 NOTE — Progress Notes (Signed)
RT placed patient on SBT 10/5 per verbal order from Dr. Dema SeverinMungal.  Patient tolerating SBT well at this time, will continue to monitor.

## 2015-06-17 ENCOUNTER — Inpatient Hospital Stay: Payer: Medicare Other

## 2015-06-17 DIAGNOSIS — R739 Hyperglycemia, unspecified: Secondary | ICD-10-CM

## 2015-06-17 LAB — GLUCOSE, CAPILLARY
GLUCOSE-CAPILLARY: 208 mg/dL — AB (ref 65–99)
GLUCOSE-CAPILLARY: 138 mg/dL — AB (ref 65–99)
GLUCOSE-CAPILLARY: 108 mg/dL — AB (ref 65–99)
GLUCOSE-CAPILLARY: 112 mg/dL — AB (ref 65–99)
Glucose-Capillary: 129 mg/dL — ABNORMAL HIGH (ref 65–99)
Glucose-Capillary: 154 mg/dL — ABNORMAL HIGH (ref 65–99)

## 2015-06-17 LAB — CBC
HEMATOCRIT: 32.4 % — AB (ref 35.0–47.0)
HEMOGLOBIN: 10.8 g/dL — AB (ref 12.0–16.0)
MCH: 28.9 pg (ref 26.0–34.0)
MCHC: 33.3 g/dL (ref 32.0–36.0)
MCV: 86.6 fL (ref 80.0–100.0)
PLATELETS: 295 10*3/uL (ref 150–440)
RBC: 3.75 MIL/uL — ABNORMAL LOW (ref 3.80–5.20)
RDW: 14.2 % (ref 11.5–14.5)
WBC: 16.7 10*3/uL — AB (ref 3.6–11.0)

## 2015-06-17 LAB — BASIC METABOLIC PANEL
ANION GAP: 4 — AB (ref 5–15)
BUN: 26 mg/dL — AB (ref 6–20)
CHLORIDE: 97 mmol/L — AB (ref 101–111)
CO2: 36 mmol/L — ABNORMAL HIGH (ref 22–32)
Calcium: 8.4 mg/dL — ABNORMAL LOW (ref 8.9–10.3)
Creatinine, Ser: 0.56 mg/dL (ref 0.44–1.00)
GFR calc Af Amer: >60 mL/min (ref >60)
GFR calc non Af Amer: >60 mL/min (ref >60)
GLUCOSE: 147 mg/dL — AB (ref 65–99)
POTASSIUM: 4.5 mmol/L (ref 3.5–5.1)
Sodium: 137 mmol/L (ref 135–145)

## 2015-06-17 MED ORDER — CETYLPYRIDINIUM CHLORIDE 0.05 % MT LIQD
7.0000 mL | Freq: Two times a day (BID) | OROMUCOSAL | Status: DC
  Administered 2015-06-17 – 2015-06-18 (×2): 7 mL via OROMUCOSAL

## 2015-06-17 MED ORDER — INSULIN ASPART 100 UNIT/ML ~~LOC~~ SOLN
0.0000 [IU] | SUBCUTANEOUS | Status: DC
  Administered 2015-06-17: 5 [IU] via SUBCUTANEOUS
  Administered 2015-06-17: 3 [IU] via SUBCUTANEOUS
  Administered 2015-06-17: 2 [IU] via SUBCUTANEOUS
  Filled 2015-06-17: qty 5
  Filled 2015-06-17: qty 11

## 2015-06-17 NOTE — Progress Notes (Signed)
Pharmacy Antibiotic Note-  Day #11 of Zosyn  Leah [name] is a 82 y.o. female admitted on 06/07/2015 with pneumonia.  Pharmacy has been consulted for zosyn dosing.  Plan: Continue Zosyn 3.375 gm IV Q8H EI. To attempt extubation today 4/9.  Height: 5\' 6"  (167.6 cm) Weight: 126 lb 15.8 oz (57.6 kg) IBW/kg (Calculated) : 59.3  Temp (24hrs), Avg:98.4 F (36.9 C), Min:98.3 F (36.8 C), Max:98.5 F (36.9 C)   Recent Labs Lab 06/11/15 0419  06/13/15 0505 06/14/15 0445 06/15/15 0521 06/16/15 0447 06/17/15 0500  WBC 18.2*  --   --  15.9*  --   --  16.7*  CREATININE  --   < > 0.61 0.58 0.55 0.61 0.56  < > = values in this interval not displayed.  Estimated Creatinine Clearance: 51 mL/min (by C-G formula based on Cr of 0.56).    No Known Allergies  Antibiotics:  Zosyn 3/30 >> Azithromycin 3/30 >> 3/30 Vancomycin 3/30 >>3/30  Microbiology: 3/31 MRSA PCR: negative 3/30 Blood cx: negative  Thank you for allowing pharmacy to be a part of this patient's care.  Damarri Rampy A, Clinical Pharmacist 06/17/2015 11:42 AM

## 2015-06-17 NOTE — Progress Notes (Signed)
CC: follow up acute resp failure HISTORY OF PRESENT ILLNESS:  This is an 82 year old Caucasian female with a past medical history of COPD, hypertension and dementia who presented from home via EMS for decreased oral intake, cough, congestion, and dyspnea. History is obtained from patient's chart, as she is currently confused and unable to provide any history. Patient lives at home with her daughter and grandson. Per ED records, EMS was called by patient's son. He indicated that patient has been refusing her medications as well as meals. Patient is disheveled and in poor hygienic condition. Upon EMS arrival, patient's oxygen saturation was 86% on room air. She was placed on 2 L nasal cannula and transferred to the ED. At the ED her oxygen saturation was 91%. She was transitioned to 5 L nasal cannula and subsequently placed on continuous BiPAP. She was also noted to be tachycardic, and a CTA chest PE study was done which was negative for PE but showed right lower lobe consolidation consistent with aspiration pneumonia as well as multiple rib fractures, and a left upper lobe lung nodule suspicious for bronchogenic carcinoma. Unclear if patient sustained any falls at home that could have resulted in rib fractures. Her CT head was negative. Patient failed BiPAP and was intubated on 06/11/15.  Subjective: Remains intubated. No acute issues overnight. Tolerated PSV during the day but placed back on full support overnight. Awake and following some commands during this am sedation vacation.     Filed Vitals:   06/17/15 0500 06/17/15 0600 06/17/15 0700 06/17/15 0800  BP: 118/52 117/50 134/51 115/54  Pulse: 69 64    Temp:      TempSrc:      Resp: Height:      Weight: 126 lb 15.8 oz (57.6 kg)     SpO2: 100% 100%     Review of Systems  Unable to perform ROS: critical illness    Physical Exam  Vitals reviewed. Constitutional: She appears toxic. She appears ill. No distress. She is  intubated.  HENT:  Head: Normocephalic and atraumatic.  Mouth/Throat: Oropharynx is clear and moist.  Eyes: Conjunctivae are normal. Pupils are equal, round, and reactive to light.  Neck: Normal range of motion. Neck supple. Carotid bruit is not present. No tracheal deviation present.  Cardiovascular: Normal rate, regular rhythm and normal heart sounds.   No murmur heard. Respiratory: She is intubated. No respiratory distress. She has no wheezes.  Mild Rhonchi  GI: Soft. Bowel sounds are normal.  Musculoskeletal: She exhibits no edema.  Neurological: She exhibits normal muscle tone. GCS eye subscore is 4. GCS verbal subscore is 5. GCS motor subscore is 6. She displays no Babinski's sign on the right side. She displays no Babinski's sign on the left side.  Awake, follows some basic commands, moves all extremities  Skin: Skin is warm. No rash noted. She is not diaphoretic. No cyanosis.     BMP Latest Ref Rng 06/17/2015 06/16/2015 06/15/2015  Glucose 65 - 99 mg/dL 098(J) 191(Y) 782(N)  BUN 6 - 20 mg/dL 56(O) 13(Y) 19  Creatinine 0.44 - 1.00 mg/dL 8.65 7.84 6.96  Sodium 135 - 145 mmol/L 137 137 133(L)  Potassium 3.5 - 5.1 mmol/L 4.5 4.4 4.1  Chloride 101 - 111 mmol/L 97(L) 97(L) 95(L)  CO2 22 - 32 mmol/L 36(H) 33(H) 35(H)  Calcium 8.9 - 10.3 mg/dL 2.9(B) 8.3(L) 7.8(L)    CBC Latest Ref Rng 06/17/2015 06/14/2015 06/11/2015  WBC 3.6 - 11.0 K/uL 16.7(H) 15.9(H)  18.2(H)  Hemoglobin 12.0 - 16.0 g/dL 10.8(L) 11.3(L) 12.1  Hematocrit 35.0 - 47.0 % 32.4(L) 33.8(L) 37.0  Platelets 150 - 440 K/uL 295 277 304    Dg Chest Port 1 View  06/17/2015  CLINICAL DATA:  Ventilator dependent respiratory failure. Followup right pleural effusion. EXAM: PORTABLE CHEST 1 VIEW COMPARISON:  06/16/2015 and earlier, including CT chest 06/07/2015. FINDINGS: Endotracheal tube tip in satisfactory position projecting approximately 6 cm above the carina. Nasogastric tube courses below the diaphragm into the stomach. Right jugular  central venous catheter tip projects over the mid SVC, unchanged. Stable moderate size right pleural effusion and associated consolidation in the right lower lobe. New consolidation in the left lower lobe. Cardiac silhouette mildly enlarged, unchanged. Pulmonary vascularity normal. Hyperinflation and prominent bronchovascular markings diffusely, unchanged. IMPRESSION: 1.  Support apparatus satisfactory. 2. Stable moderate size right pleural effusion and associated passive atelectasis versus pneumonia in the right lower lobe. 3. New left lower lobe atelectasis. 4. COPD/emphysema at baseline. Electronically Signed   By: Hulan Saas M.D.   On: 06/17/2015 08:08   Dg Abd Portable 1v  06/16/2015  CLINICAL DATA:  Evaluate tube EXAM: PORTABLE ABDOMEN - 1 VIEW COMPARISON:  June 08, 2015 FINDINGS: An NG tube terminates in the upper left pelvis, just to the left of midline, likely within the distal stomach. No other tubes identified. Small layering effusion in the right lung base. IMPRESSION: The distal tip of the NG tube is thought to be within the distal stomach. Electronically Signed   By: Gerome Sam III M.D   On: 06/16/2015 13:30   ANTIBIOTICS: Zosyn started 06/07/2015  CULTURES: 0 3/30-2017 Blood cultures-NTD MRSA screen negative  SIGNIFICANT EVENTS: 0 3/30-2017: Admitted with acute respiratory failure right lower lobe Pneumonia, sepsis secondary to pneumonia, protein calorie malnutrition, multiple rib fractures of unknown cause, and lung nodule 04/04>Intubated  LINES/TUBES: Left femoral triple-lumen catheter placed on 06/07/2015-04/03 CVC Triple Lumen 06/11/15 Right Internal jugular  IMPRESSION: 82 yo white female with acute resp failure from acute COPD exacerbation and acute pneumonia   ASSESSMENT / PLAN:  PULMONARY A: Acute respiratory failure secondary to aspiration pneumonia and acute COPD exacerbation Multiple rib fractures. Lung nodule. Rule out malignancy. Acute COPD  exacerbation Right lower lobe pneumonia-aspiration versus bacterial P:  - Continue Full MV support and wean as tolerated - Continue Bronchodilator Therapy - Wean Fio2 and PEEP as tolerated - Plan for PSV as tolerated today and possible extubation. Would like patient to be more awake/alert, with stronger cough, since this is a one way extubation planned.  -Nebulized bronchodilators and inhaled steroids - IV steroids  - VAP prevention - Empiric antibiotics  CARDIOVASCULAR A:  Septic shock History of hypertension P:  -Levophed, titrate to MAP> 65 -Hold all home antihypertensives in light of hypotension -Hemodynamics per ICU protocol  RENAL A:  severe hypokalemia P:  -Monitor and replace electrolytes -Monitor creatinine  GASTROINTESTINAL A:  Protein calorie malnutrition Dysphagia P:  -Keep nothing by mouth -PPI for stress ulcer prophylaxis  HEMATOLOGIC A:  No acute issues P:  -Lovenox for VTE prophylaxis  INFECTIOUS A:  Aspiration pneumonia Sepsis secondary to pneumonia P:  -Empiric antibiotics. -Follow-up cultures  ENDOCRINE A:  No acute issues  P:  - Blood glucose monitoring with SSI coverage  NEUROLOGIC A:  History of Alzheimer's dementia P:  RASS goal 0 to -1 -Supportive care. -Continue home medications  Disposition and family update: No family at bedside. DNR/DNI. WIll continue to wean vent as tolerated and  discuss goals of care with son.  Once patient is extubated, will NOT re-intubate  CCM time 35 minutes  Magdalene S. [facility]ukov ANP-BC Pulmonary and Critical Care Medicine [facility] Pager [phone]

## 2015-06-17 NOTE — Progress Notes (Signed)
Patient being transferred from ICU to medical service.    Discussed w/ Dr. Dema SeverinMungal.    Briefly pt. Is here due to COPD Exacerbation due to RUL pneumonia.  Failed bipap and was intubated and now extubated and will be transferred to floor tomorrow.

## 2015-06-17 NOTE — Progress Notes (Signed)
Update  Patient extubated and doing well. Son at bedside. No significant sob, on inc wob. Stable for transfer in the AM. Spoke with Dr. Cherlynn [facility]Sainani [facility](Hospitalist), who accepted patient to hospitalist service on 4/10 AM.   Stephanie AcreVishal Jamare Vanatta, MD [place] [facility] Pager [phone]- [phone] (please enter 7-digits) On Call Pager - [phone] (please enter 7-digits)

## 2015-06-17 NOTE — Consult Note (Signed)
MEDICATION RELATED CONSULT NOTE - follow up  Pharmacy Consult for Electrolyte Management/Constipation Prevention Indication: Hypophosphatemia, hypokalemia  No Known Allergies  Patient Measurements: Height: _0  (167.6 cm) Weight: 126 lb 15.8 oz (57.6 kg) IBW/kg (Calculated) : 59.3  Vital Signs: Temp: 98.3 F (36.8 C) (04/09 0400) Temp Source: Oral (04/09 0400) BP: 115/54 mmHg (04/09 0800) Pulse Rate: 64 (04/09 0600) Intake/Output from previous day: 04/08 0701 - 04/09 0700 In: 1945.5 [I.V.:616.5; NG/GT:1179; IV Piggyback:150] Out: 2015 [Urine:2015] Intake/Output from this shift: Total I/O In: 18.8 [I.V.:18.8] Out: -   Labs:  Recent Labs  06/14/15 1850 06/15/15 0521 06/16/15 0447 06/17/15 0500  WBC  --   --   --  16.7*  HGB  --   --   --  10.8*  HCT  --   --   --  32.4*  PLT  --   --   --  295  CREATININE  --  0.55 0.61 0.56  MG 2.3 1.9 2.0  --   PHOS  --   --  3.7  --    Estimated Creatinine Clearance: 51 mL/min (by C-G formula based on Cr of 0.56).   Medical History: Past Medical History  Diagnosis Date  . Dementia   . COPD (chronic obstructive pulmonary disease) ([place])   . Essential hypertension     Medications:  Scheduled:  . ALPRAZolam  1 mg Oral TID  . antiseptic oral rinse  7 mL Mouth Rinse 10 times per day  . budesonide (PULMICORT) nebulizer solution  0.25 mg Nebulization BID  . chlorhexidine gluconate (SAGE KIT)  15 mL Mouth Rinse BID  . donepezil  5 mg Oral QHS  . enoxaparin (LOVENOX) injection  40 mg Subcutaneous Q24H  . free water  200 mL Per Tube 3 times per day  . insulin aspart  0-15 Units Subcutaneous 6 times per day  . ipratropium-albuterol  3 mL Nebulization Q6H  . methylPREDNISolone (SOLU-MEDROL) injection  20 mg Intravenous Q12H  . nicotine  21 mg Transdermal Daily  . pantoprazole sodium  40 mg Per Tube Q1200  . piperacillin-tazobactam (ZOSYN)  IV  3.375 g Intravenous 3 times per day  . potassium chloride  20 mEq Per Tube BID  .  senna-docusate  2 tablet Oral BID  . sodium chloride flush  3 mL Intravenous Q12H    Assessment: Leah [name] is an 82 yo female presenting with AMS, fall, and found pneumonia. Pharmacy consulted to manage electrolytes and constipation prevention in this patient.  Plan:  1. Electrolytes are WNL. Will f/u AM labs. Patient on KCL per tube 60mq bid. Continuous Tube feeds  2. BM on 4/9 (bisacodyl supp 4/8). Continue senna-docusate 2 tabs po bid for now- f/u.   KChinita GreenlandPharmD Clinical Pharmacist 06/17/2015

## 2015-06-17 NOTE — Progress Notes (Signed)
RT to room to extubate patient.  Patient suctioned prior to extubation to 2LPM , extubated without complication.  Patient had some initial increased WOB, O2 sat mid 80's, O2 increased to 4LPM, patient much improved.  Will continue to monitor.

## 2015-06-17 NOTE — Progress Notes (Signed)
Patient has been placed back in PSV  Per Marylou FlesherMagadalene Tukov, NP.  RN has turned off sedation. Patient appears to be tolerating well at the moment. Vitals stable. Will continue to monitor.

## 2015-06-17 NOTE — Progress Notes (Signed)
LCSW consulted ICU nurses both Sat and Sunday and patient was on vent and unable to assess. Patient son is supposed to come in to make decision on ex-tubate his mother. Patient was resting when LCSW checked in her room. Will write up patient needs to be seen by weekday LCSW. No further needs  Delta Air LinesClaudine Hae Ahlers LCSW [phone]

## 2015-06-18 DIAGNOSIS — J449 Chronic obstructive pulmonary disease, unspecified: Secondary | ICD-10-CM

## 2015-06-18 DIAGNOSIS — J9621 Acute and chronic respiratory failure with hypoxia: Secondary | ICD-10-CM

## 2015-06-18 LAB — GLUCOSE, CAPILLARY
GLUCOSE-CAPILLARY: 190 mg/dL — AB (ref 65–99)
Glucose-Capillary: 104 mg/dL — ABNORMAL HIGH (ref 65–99)
Glucose-Capillary: 116 mg/dL — ABNORMAL HIGH (ref 65–99)
Glucose-Capillary: 81 mg/dL (ref 65–99)
Glucose-Capillary: 95 mg/dL (ref 65–99)

## 2015-06-18 LAB — CBC
HCT: 34.5 % — ABNORMAL LOW (ref 35.0–47.0)
Hemoglobin: 11.4 g/dL — ABNORMAL LOW (ref 12.0–16.0)
MCH: 29.1 pg (ref 26.0–34.0)
MCHC: 32.9 g/dL (ref 32.0–36.0)
MCV: 88.4 fL (ref 80.0–100.0)
PLATELETS: 304 10*3/uL (ref 150–440)
RBC: 3.91 MIL/uL (ref 3.80–5.20)
RDW: 14.3 % (ref 11.5–14.5)
WBC: 19.1 10*3/uL — ABNORMAL HIGH (ref 3.6–11.0)

## 2015-06-18 LAB — BASIC METABOLIC PANEL
Anion gap: 6 (ref 5–15)
BUN: 23 mg/dL — AB (ref 6–20)
CALCIUM: 8.6 mg/dL — AB (ref 8.9–10.3)
CHLORIDE: 99 mmol/L — AB (ref 101–111)
CO2: 36 mmol/L — AB (ref 22–32)
CREATININE: 0.66 mg/dL (ref 0.44–1.00)
GFR calc Af Amer: >60 mL/min (ref >60)
GFR calc non Af Amer: >60 mL/min (ref >60)
Glucose, Bld: 104 mg/dL — ABNORMAL HIGH (ref 65–99)
Potassium: 3.9 mmol/L (ref 3.5–5.1)
SODIUM: 141 mmol/L (ref 135–145)

## 2015-06-18 LAB — MAGNESIUM: Magnesium: 1.8 mg/dL (ref 1.7–2.4)

## 2015-06-18 MED ORDER — CETYLPYRIDINIUM CHLORIDE 0.05 % MT LIQD
7.0000 mL | Freq: Two times a day (BID) | OROMUCOSAL | Status: DC
  Administered 2015-06-18 – 2015-06-19 (×3): 7 mL via OROMUCOSAL

## 2015-06-18 MED ORDER — MORPHINE SULFATE (PF) 2 MG/ML IV SOLN
INTRAVENOUS | Status: AC
  Filled 2015-06-18: qty 1

## 2015-06-18 MED ORDER — MORPHINE SULFATE (PF) 2 MG/ML IV SOLN
1.0000 mg | INTRAVENOUS | Status: DC | PRN
  Administered 2015-06-18 – 2015-06-19 (×3): 1 mg via INTRAVENOUS
  Filled 2015-06-18 (×2): qty 1

## 2015-06-18 MED ORDER — MORPHINE SULFATE (PF) 2 MG/ML IV SOLN
1.0000 mg | Freq: Once | INTRAVENOUS | Status: AC
  Administered 2015-06-18: 1 mg via INTRAVENOUS

## 2015-06-18 MED ORDER — AMOXICILLIN-POT CLAVULANATE 500-125 MG PO TABS
1.0000 | ORAL_TABLET | Freq: Three times a day (TID) | ORAL | Status: DC
  Administered 2015-06-18 – 2015-06-20 (×5): 500 mg via ORAL
  Filled 2015-06-18 (×5): qty 1

## 2015-06-18 MED ORDER — ALPRAZOLAM 0.5 MG PO TABS: 0.5000 mg | ORAL_TABLET | Freq: Three times a day (TID) | ORAL | Status: DC | PRN

## 2015-06-18 NOTE — Progress Notes (Signed)
No distress No new complaints  Filed Vitals:   06/18/15 0700 06/18/15 0800 06/18/15 0840 06/18/15 0900  BP: 177/69 159/71  156/66  Pulse: 76 66  69  Temp:  97.8 F (36.6 C)    TempSrc:      Resp: 30 24  29   Height:      Weight:      SpO2: 91% 94% 96% 99%   NAD No JVD No wheezes Reg, no M NABS, soft Ext warm, no edema  BMP Latest Ref Rng 06/18/2015 06/17/2015 06/16/2015  Glucose 65 - 99 mg/dL 161(W104(H) 960(A147(H) 540(J245(H)  BUN 6 - 20 mg/dL 81(X23(H) 91(Y26(H) 78(G22(H)  Creatinine 0.44 - 1.00 mg/dL 9.560.66 2.130.56 0.860.61  Sodium 135 - 145 mmol/L 141 137 137  Potassium 3.5 - 5.1 mmol/L 3.9 4.5 4.4  Chloride 101 - 111 mmol/L 99(L) 97(L) 97(L)  CO2 22 - 32 mmol/L 36(H) 36(H) 33(H)  Calcium 8.9 - 10.3 mg/dL 5.7(Q8.6(L) 4.6(N8.4(L) 8.3(L)    CBC Latest Ref Rng 06/18/2015 06/17/2015 06/14/2015  WBC 3.6 - 11.0 K/uL 19.1(H) 16.7(H) 15.9(H)  Hemoglobin 12.0 - 16.0 g/dL 11.4(L) 10.8(L) 11.3(L)  Hematocrit 35.0 - 47.0 % 34.5(L) 32.4(L) 33.8(L)  Platelets 150 - 440 K/uL 304 295 277    No new CXR  IMPRESSION: Acute on chronic respiratory failure. Extubated successfully 04/09 COPD with resolving acute exacerbation Suspicion for aspiration PNA Advanced dementia DNR/DNI  PLAN/REC: Continue nebulized steroids and bronchodilators Cont supplemental O2 Change abx to Augmentin - would complete 5 more days Discharge planning - ?[facility]  Would consider Hospice after discharge Agree with transfer to gen med floor. Hospitalist Service to assume primary duties  PCCM will sign off. Please call if we can be of further assistance  Billy Fischeravid Saria Haran, MD PCCM service Mobile [phone] Pager [phone] 06/18/2015

## 2015-06-18 NOTE — Progress Notes (Addendum)
Speech Therapy Note: reviewed chart notes this PM noting pt had been extubated post lengthy oral intubation. An oral diet had been started post passing NSG swallow screen. However, pt began to have a declined respiratory status post noon time and a bath per chart notes. Due to low O2 sats and increased RR, BiPAP has been restarted and pt is not appropriate for po intake/trials at this time, per NSG report.  ST will f/u w/ pt's status tomorrow and assess pt w/ po trials for least restrictive diet if pt is able to wean from BiPAP. Rec. NPO until then d/t increased risk for aspiration sec. to declined respiratory presentation overall and her advanced Dementia(per H&P). NSG agreed.

## 2015-06-18 NOTE — Care Management (Signed)
Patient extubated 4/9 and had anticipated transfer out of icu.  After bath patient became very short of breath and lethargic.  Dr Sung AmabileSimonds spoke with son and confirmed DNR/DNI.  It sounds as though comfort measures were discussed.  Palliative care consult is pending

## 2015-06-18 NOTE — Progress Notes (Addendum)
[facility] - [place] at [facility]   PATIENT NAME: Leah [name]    MR#:  [id number]  DATE OF BIRTH:  1932/04/06  SUBJECTIVE:  CHIEF COMPLAINT:   Chief Complaint  Patient presents with  . Failure To Thrive  . Fall   Demented, on O2NC 4L,  REVIEW OF SYSTEMS:  Unable to get ROS due to dementia.  DRUG ALLERGIES:  No Known Allergies  VITALS:  Blood pressure 197/115, pulse 65, temperature 97.8 F (36.6 C), temperature source Oral, resp. rate 35, height  (1.676 m), weight 56.7 kg (125 lb), SpO2 100 %.  PHYSICAL EXAMINATION:  GENERAL:  82 y.o.-year-old patient lying in the bed with no acute distress.  EYES: Pupils equal, round, reactive to light and accommodation. No scleral icterus. Extraocular muscles intact.  HEENT: Head atraumatic, normocephalic. Oropharynx and nasopharynx clear.  NECK:  Supple, no jugular venous distention. No thyroid enlargement, no tenderness.  LUNGS: Very diminished breath sounds bilaterally, no wheezing, rales,rhonchi or crepitation. No use of accessory muscles of respiration.  CARDIOVASCULAR: S1, S2 normal. No murmurs, rubs, or gallops.  ABDOMEN: Soft, nontender, nondistended. Bowel sounds present. No organomegaly or mass.  EXTREMITIES: No pedal edema, cyanosis, or clubbing.  NEUROLOGIC: unable to exam.  PSYCHIATRIC: The patient is demented. SKIN: No obvious rash, lesion, or ulcer.    LABORATORY PANEL:   CBC  Recent Labs Lab 06/18/15 0520  WBC 19.1*  HGB 11.4*  HCT 34.5*  PLT 304   ------------------------------------------------------------------------------------------------------------------  Chemistries   Recent Labs Lab 06/12/15 1405  06/18/15 0520  NA 142  < > 141  K 4.1  < > 3.9  CL 106  < > 99*  CO2 35*  < > 36*  GLUCOSE 162*  < > 104*  BUN 28*  < > 23*  CREATININE 0.75  < > 0.66  CALCIUM 8.4*  < > 8.6*  MG  --   < > 1.8  AST 32  --   --   ALT 39  --   --   ALKPHOS 57  --   --   BILITOT  0.8  --   --   < > = values in this interval not displayed. ------------------------------------------------------------------------------------------------------------------  Cardiac Enzymes  Recent Labs Lab 06/12/15  TROPONINI 0.61*   ------------------------------------------------------------------------------------------------------------------  RADIOLOGY:  Dg Chest Port 1 View  06/17/2015  CLINICAL DATA:  Ventilator dependent respiratory failure. Followup right pleural effusion. EXAM: PORTABLE CHEST 1 VIEW COMPARISON:  06/16/2015 and earlier, including CT chest 06/07/2015. FINDINGS: Endotracheal tube tip in satisfactory position projecting approximately 6 cm above the carina. Nasogastric tube courses below the diaphragm into the stomach. Right jugular central venous catheter tip projects over the mid SVC, unchanged. Stable moderate size right pleural effusion and associated consolidation in the right lower lobe. New consolidation in the left lower lobe. Cardiac silhouette mildly enlarged, unchanged. Pulmonary vascularity normal. Hyperinflation and prominent bronchovascular markings diffusely, unchanged. IMPRESSION: 1.  Support apparatus satisfactory. 2. Stable moderate size right pleural effusion and associated passive atelectasis versus pneumonia in the right lower lobe. 3. New left lower lobe atelectasis. 4. COPD/emphysema at baseline. Electronically Signed   By: Hulan Saas M.D.   On: 06/17/2015 08:08    EKG:   Orders placed or performed during the hospital encounter of 06/07/15  . ED EKG  . ED EKG  . EKG 12-Lead  . EKG 12-Lead    ASSESSMENT AND PLAN:   1. Acute respiratory Failure due to COPD exacerbation She was  on Full MV support, s/p extubation yesterday. continue O2  Bend, BIPAP prn. Continue Bronchodilator Therapy  2.COPD exacerbation Continue IV solumedrol. Changed from zosyn to augmentin.  3. Dehydration. Better.  4. Leukocytosis. Possible due to steroid. 5.  Dementia. Aspiration precaution.  I discussed with Dr. Bard HerbertSimmonds. All the records are reviewed and case discussed with Care Management/Social Workerr. Management plans discussed with the patient, family and they are in agreement.  CODE STATUS: DNR.  TOTAL TIME TAKING CARE OF THIS PATIENT: 38 minutes.  Greater than 50% time was spent on coordination of care and face-to-face counseling.  POSSIBLE D/C IN 3 DAYS, DEPENDING ON CLINICAL CONDITION.   Shaune Pollackhen, Leah [name] M.D on 06/18/2015 at 1:54 PM  Between 7am to 6pm - Pager - [phone]  After 6pm go to [email or web address] - password [email or web address] [facility]  [facility]  Office  [phone]  CC: Primary care physician; Jaclyn ShaggyATE,DENNY C, MD

## 2015-06-18 NOTE — Progress Notes (Signed)
PT Cancellation Note  Patient Details Name: Leah [name] MRN: [id number] DOB: [birth year]   Cancelled Treatment:    Reason Eval/Treat Not Completed: Medical issues which prohibited therapy.  Per nursing, pt is inappropriate to see at this time (14:57) due to pt on BiPAP.  Will re-attempt PT eval at a later date and time if medically appropriate.  Lyndel SafeGarrett Tawan Corkern, SPT Lyndel SafeGarrett Sharalee Witman 06/18/2015, 4:13 PM

## 2015-06-18 NOTE — Progress Notes (Signed)
1530 Resp, status better. B/p down to normal. BiPap removed per patient request- resp. Rate up to 40.O2 sats down to 70s. BiPap  Restarted. Dr. Darrol AngelSimons updated. No new orders received.

## 2015-06-18 NOTE — Progress Notes (Signed)
1300 O2 SATS DECREASING. MORE restless. B/P up. Respiratory rate up. Consulted with Dr. Darrol AngelSimons. Restarted on BiPap. Given Morphine for air hunger. Stayed with patient for comfort..Marland Kitchen

## 2015-06-18 NOTE — Significant Event (Signed)
After bathing, pt developed respiratory distress and hypoxemia despite NRB maks I was called to bedside to evaluate She is somnolent and tachypneic SpO2 92% on NRB BS are markedly diminished with no wheezes noted anteriorly Tachy, regular, no M Ext without edema  IMP: Acute respiratory distress Acute on chronic respiratory failure End stage COPD/emphysema Recent fall with rib trauma  PLAN: I have spoken with pt's son who is @ bedside and confirmed DNR/DNI status Our highest priority is her comfort and dignity Continue medical therapies for COPD PRN BiPAP ordered for respiratory distress Low dose PRN morphine to be used for any form of distress We will keep her here in SDU to allow for closer nursing monitoring and PRN BiPAP  Billy Fischeravid Simonds, MD PCCM service Mobile [phone] Pager [phone] 06/18/2015

## 2015-06-18 NOTE — Progress Notes (Signed)
Was called by the RN to assess pt. She had just completed a bath and her 02 sats dropped to the low 80s. We put her on a nonrebreather mask and the 02 sats increased to the low 90s. She was tachypneic with a RR of 32-38. She was working hard to breath at this time. Dr. Sung AmabileSimonds talked to his son and decided to place her on bipap.

## 2015-06-19 DIAGNOSIS — E876 Hypokalemia: Secondary | ICD-10-CM

## 2015-06-19 DIAGNOSIS — R2689 Other abnormalities of gait and mobility: Secondary | ICD-10-CM

## 2015-06-19 DIAGNOSIS — F0390 Unspecified dementia without behavioral disturbance: Secondary | ICD-10-CM

## 2015-06-19 DIAGNOSIS — R6521 Severe sepsis with septic shock: Secondary | ICD-10-CM

## 2015-06-19 DIAGNOSIS — Z515 Encounter for palliative care: Secondary | ICD-10-CM

## 2015-06-19 DIAGNOSIS — R000 Tachycardia, unspecified: Secondary | ICD-10-CM

## 2015-06-19 DIAGNOSIS — Z9181 History of falling: Secondary | ICD-10-CM

## 2015-06-19 DIAGNOSIS — J9621 Acute and chronic respiratory failure with hypoxia: Secondary | ICD-10-CM | POA: Insufficient documentation

## 2015-06-19 DIAGNOSIS — J439 Emphysema, unspecified: Secondary | ICD-10-CM

## 2015-06-19 DIAGNOSIS — Z978 Presence of other specified devices: Secondary | ICD-10-CM

## 2015-06-19 DIAGNOSIS — Z9989 Dependence on other enabling machines and devices: Secondary | ICD-10-CM

## 2015-06-19 DIAGNOSIS — J690 Pneumonitis due to inhalation of food and vomit: Secondary | ICD-10-CM

## 2015-06-19 DIAGNOSIS — J9622 Acute and chronic respiratory failure with hypercapnia: Secondary | ICD-10-CM

## 2015-06-19 DIAGNOSIS — E43 Unspecified severe protein-calorie malnutrition: Secondary | ICD-10-CM

## 2015-06-19 LAB — GLUCOSE, CAPILLARY
GLUCOSE-CAPILLARY: 76 mg/dL (ref 65–99)
Glucose-Capillary: 74 mg/dL (ref 65–99)
Glucose-Capillary: 113 mg/dL — ABNORMAL HIGH (ref 65–99)
Glucose-Capillary: 115 mg/dL — ABNORMAL HIGH (ref 65–99)

## 2015-06-19 MED ORDER — LACTATED RINGERS IV SOLN
INTRAVENOUS | Status: DC
  Administered 2015-06-19: 15:00:00 via INTRAVENOUS

## 2015-06-19 MED ORDER — PROCHLORPERAZINE 25 MG RE SUPP
25.0000 mg | Freq: Three times a day (TID) | RECTAL | Status: DC | PRN
  Filled 2015-06-19: qty 1

## 2015-06-19 MED ORDER — MORPHINE SULFATE (PF) 2 MG/ML IV SOLN
2.0000 mg | INTRAVENOUS | Status: DC | PRN
  Administered 2015-06-20 (×2): 2 mg via INTRAVENOUS
  Filled 2015-06-19 (×2): qty 1

## 2015-06-19 MED ORDER — ENSURE ENLIVE PO LIQD
237.0000 mL | Freq: Three times a day (TID) | ORAL | Status: DC
  Administered 2015-06-19 – 2015-06-21 (×5): 237 mL via ORAL

## 2015-06-19 MED ORDER — MORPHINE SULFATE (CONCENTRATE) 10 MG/0.5ML PO SOLN: 5.0000 mg | ORAL | Status: DC | PRN

## 2015-06-19 MED ORDER — SODIUM CHLORIDE 0.9 % IV SOLN
INTRAVENOUS | Status: DC
  Administered 2015-06-19: 10:00:00 via INTRAVENOUS

## 2015-06-19 MED ORDER — BISACODYL 10 MG RE SUPP: 10.0000 mg | Freq: Every day | RECTAL | Status: DC | PRN

## 2015-06-19 MED ORDER — LORAZEPAM 2 MG/ML IJ SOLN: 0.5000 mg | INTRAMUSCULAR | Status: DC | PRN

## 2015-06-19 NOTE — Care Management (Signed)
Two previous palliative care consults have been discontinued by NP on 4/8  @ 0128.  Unsure of reason.  Have reached out to palliative care that patient does need to be assessed today.  She required non rebreather and bipap last night

## 2015-06-19 NOTE — Progress Notes (Signed)
No distress this AM. Has been off BiPAP all morning. Lethargic No new complaints  Filed Vitals:   06/19/15 1154 06/19/15 1200 06/19/15 1212 06/19/15 1300  BP: 95/64 106/60  112/61  Pulse: 82 69  77  Temp:      TempSrc:      Resp:  21  36  Height:      Weight:      SpO2: 93% 94% 95% 100%   NAD No JVD No wheezes Reg, no M NABS, soft Ext warm, no edema  BMP Latest Ref Rng 06/18/2015 06/17/2015 06/16/2015  Glucose 65 - 99 mg/dL 098(J104(H) 191(Y147(H) 782(N245(H)  BUN 6 - 20 mg/dL 56(O23(H) 13(Y26(H) 86(V22(H)  Creatinine 0.44 - 1.00 mg/dL 7.840.66 6.960.56 2.950.61  Sodium 135 - 145 mmol/L 141 137 137  Potassium 3.5 - 5.1 mmol/L 3.9 4.5 4.4  Chloride 101 - 111 mmol/L 99(L) 97(L) 97(L)  CO2 22 - 32 mmol/L 36(H) 36(H) 33(H)  Calcium 8.9 - 10.3 mg/dL 2.8(U8.6(L) 1.3(K8.4(L) 8.3(L)    CBC Latest Ref Rng 06/18/2015 06/17/2015 06/14/2015  WBC 3.6 - 11.0 K/uL 19.1(H) 16.7(H) 15.9(H)  Hemoglobin 12.0 - 16.0 g/dL 11.4(L) 10.8(L) 11.3(L)  Hematocrit 35.0 - 47.0 % 34.5(L) 32.4(L) 33.8(L)  Platelets 150 - 440 K/uL 304 295 277    No new CXR  IMPRESSION: Acute on chronic respiratory failure. Extubated 04/09 COPD with resolving acute exacerbation Suspicion for aspiration PNA Advanced dementia DNR/DNI  PLAN/REC: Continue nebulized steroids and bronchodilators Cont supplemental O2 Cont Augmentin - complete 5 days Discharge planning - ?[facility]. Would consider Hospice after discharge  I have asked Palliative Care to see Given events of yesterday afternoon, will keep in SDU for today   Billy Fischeravid Tadeusz Stahl, MD PCCM service Mobile [phone] Pager [phone] 06/19/2015

## 2015-06-19 NOTE — Consult Note (Signed)
Palliative Medicine Inpatient Consult Note   Name: Leah [name] Date: 06/19/2015 MRN: [id number]  DOB: 1932/07/09  Referring Physician: Shaune Pollack, MD  Palliative Care consult requested for this 82 y.o. female for goals of medical therapy in patient with acute on chronic respiratory failure due to end stage emphysema/ COPD.    TODAY'S DISCUSSIONS AND DECISIONS:  Pt has an adult son, Siri Cole, who is most involved in her care and finances. He managed his dad's affairs until he died not too long ago. Pt also has a living daughter who is apparently not very involved but who has come up here.  And there is a younger son, Peyton Najjar, who helps Siri Cole out some . Siri Cole is not HCPOA, but he is the primary caregive and decision maker, though in the absence of a legal document such as HCPOA, major decisions fall to a majority of adult children of pt.  There was another daughter, but she died about 7 mos ago here of a massive MI.  I had a long telephone conversation with Siri Cole, today.  He is very reasonable.  I went into great detail about all her problems and the options going forward.  I mentioned hospice and hospice home as options. He is familiar with Hospice Home having visited others there. We covered many issues during this conversation.  However, it did not seem appropriate for me to pressure him by phone into making a sudden choice for Hospice Home --if he wasn't already leaning in that direction. Over the course of the conversation, I was able to suggest that she is having good and bad hours --and is looking like she is on a 'roller coaster course'.  But this roller coaster could be going 'from [place] to [facility]'.  He was advised to consider hospice or comfort care if she does not turn around more than she has within a few days.  I educated him about how morphine helps ease air hunger and a sense of smothering and he is OK with higher doses (she has only been getting 1 mg --might do better with  oral Roxanol).  He also agrees I can reduce her oral pills for now --to see if she will eat. Will talk with attending about the challenge for pt to swallow the giant Augmentin pill (nurse cut it in half and she was able to swallow it earlier today when doing better --now on BIPAP, she might not be able to swallow it. See order changes.   I will meet with son tomorrow.     BRIEF HISTORY: Pt is 64 yo woman admitted with weakness.  Her son came by to take her to a doctor's appointment and found that she was too weak to get out of bed.  She had had a fall two days prior.  Son noted that she was more short of breath than usual and brought her to the ER on 06/07/15.   Cta of chest showed no PE, but right pneumonia (thought to be aspiration related vs due to the posterior right rib fractures of ribs 9-12).  She was noted to have acute on chronic respiratory failure due to COPD and pneumonia and was started on Oxygen and nebs and ABX. She then required BIPAP and also Levophed due to septic shock. She progressed and was on nasal cannula oxygen but on 4/3, she worsened in the evening and went back to the ICU on BIPAP.  NG feedings were given while she was intubated until 4/9 when  she was able to be extubated.  Dr Dema SeverinMungal had a conversation with the patient's son about her condition and prognosis and her full code status was changed to DNR with the understanding that there would be no option for putting her back on the ventiltor, once off. Since then, she is on and off BIPAP, but currently not doing well.   At this time, she is on BIPAP and is tachypneic and tachycardic. Her resp rate was 40 plus when she was placed back on BIPAP at around 4 pm. Now, at 5:25 pm, her RR is 32 and HR is 115.  Sats are 94% currently.   She is not eating.  She has been NPO for a time and today had about 360 ml in orally.      IMPRESSION: Septic Shock Acute on chronic respiratory failure with hypoxemia and hypercapnia ---was  intubated when failed BIPAP ---extubated on 4/9.  End stage emphysema/ COPD ----unable to remain off of BIPAP for very long ----currently prn doses of morphine and ativan have not helped HR and RR Suspected Aspiration Pneumonia (RUL) Hypophosphatemia Hypokalemia HTN Anxiety Dementia of alzheimer's type --oriented to person and place but confused on other matters most of the time Imaging shows a left upper lung nodule suspicious for bronchogenic CA. Rib Fxs (likely from falls) Severe Malnutrition ---present at adm ---albumin is 2.5 (on 4/417) Dysphagia Gait disorder with fall two days prior to admission  REVIEW OF SYSTEMS:  Patient is not able to provide ROS since she is back on BIPAP  SPIRITUAL SUPPORT SYSTEM: Yes  SOCIAL HISTORY:  reports that she has been smoking Cigarettes.  She has a 60 pack-year smoking history. She has never used smokeless tobacco. She reports that she does not drink alcohol or use illicit drugs. Was living at home.  Son is involved in pts life and care.  But apparently pt lived alone .  LEGAL DOCUMENTS:  I have completed a portable DNR form for the chart.  CODE STATUS: DNR and DO NOT INTUBATE again.  PAST MEDICAL HISTORY: Past Medical History  Diagnosis Date  . Dementia   . COPD (chronic obstructive pulmonary disease) (HCC)   . Essential hypertension     PAST SURGICAL HISTORY: History reviewed. No pertinent past surgical history.  ALLERGIES:  has No Known Allergies.  MEDICATIONS:  Current Facility-Administered Medications  Medication Dose Route Frequency Provider Last Rate Last Dose  . acetaminophen (TYLENOL) tablet 650 mg  650 mg Oral Q6H PRN Houston SirenVivek J Sainani, MD       Or  . acetaminophen (TYLENOL) suppository 650 mg  650 mg Rectal Q6H PRN Houston SirenVivek J Sainani, MD      . ALPRAZolam Prudy Feeler(XANAX) tablet 0.5 mg  0.5 mg Oral TID PRN Merwyn Katosavid B Simonds, MD      . amoxicillin-clavulanate (AUGMENTIN) 500-125 MG per tablet 500 mg  1 tablet Oral TID Merwyn Katosavid B  Simonds, MD   500 mg at 06/19/15 1032  . antiseptic oral rinse (CPC / CETYLPYRIDINIUM CHLORIDE 0.05%) solution 7 mL  7 mL Mouth Rinse BID Merwyn Katosavid B Simonds, MD   7 mL at 06/19/15 1000  . budesonide (PULMICORT) nebulizer solution 0.25 mg  0.25 mg Nebulization BID Houston SirenVivek J Sainani, MD   0.25 mg at 06/19/15 0837  . donepezil (ARICEPT) tablet 5 mg  5 mg Oral QHS Houston SirenVivek J Sainani, MD   5 mg at 06/16/15 2120  . enoxaparin (LOVENOX) injection 40 mg  40 mg Subcutaneous Q24H Lewie LoronMagadalene S Tukov, NP  40 mg at 06/19/15 0800  . feeding supplement (ENSURE ENLIVE) (ENSURE ENLIVE) liquid 237 mL  237 mL Oral TID BM Shaune Pollack, MD   237 mL at 06/19/15 1425  . ipratropium-albuterol (DUONEB) 0.5-2.5 (3) MG/3ML nebulizer solution 3 mL  3 mL Nebulization Q6H Houston Siren, MD   3 mL at 06/19/15 1530  . lactated ringers infusion   Intravenous Continuous Merwyn Katos, MD 50 mL/hr at 06/19/15 1430    . lactulose (CHRONULAC) 10 GM/15ML solution 20 g  20 g Oral BID PRN Cy Blamer, RPH   20 g at 06/15/15 1134  . LORazepam (ATIVAN) injection 2 mg  2 mg Intravenous Q2H PRN Erin Fulling, MD   2 mg at 06/19/15 1610  . morphine 2 MG/ML injection 1-2 mg  1-2 mg Intravenous Q3H PRN Merwyn Katos, MD   1 mg at 06/19/15 1540  . nicotine (NICODERM CQ - dosed in mg/24 hours) patch 21 mg  21 mg Transdermal Daily Altamese Dilling, MD   21 mg at 06/19/15 1000  . ondansetron (ZOFRAN) injection 4 mg  4 mg Intravenous Q6H PRN Houston Siren, MD      . pneumococcal 23 valent vaccine (PNU-IMMUNE) injection 0.5 mL  0.5 mL Intramuscular Prior to discharge Altamese Dilling, MD      . senna-docusate (Senokot-S) tablet 2 tablet  2 tablet Oral BID Erin Fulling, MD   2 tablet at 06/19/15 1012    Vital Signs: BP 140/125 mmHg  Pulse 120  Temp(Src) 98.2 F (36.8 C) (Axillary)  Resp 31  Ht  (1.676 m)  Wt 56.7 kg (125 lb)  BMI 20.19 kg/m2  SpO2 98% Filed Weights   06/16/15 0500 06/17/15 0500 06/18/15 0500  Weight: 57.3  kg (126 lb 5.2 oz) 57.6 kg (126 lb 15.8 oz) 56.7 kg (125 lb)    Estimated body mass index is 20.19 kg/(m^2) as calculated from the following:   Height as of this encounter:  (1.676 m).   Weight as of this encounter: 56.7 kg (125 lb).  PERFORMANCE STATUS (ECOG) : 4 - Bedbound  PHYSICAL EXAM: Lying in ICU bed Off BIPAP for much of the day but back on at around 4 pm (became tachypneic and resp rate was 40's).  Given morphine --not much help.  Ativan no help. Resp rate still 38 and tachycardic. Eyes closed No JVD or TM Hrt rrr tachy Lungs with rales in right base and decreased BS bilat bases ---Plus BIPAP sounds Abd soft and NT Ext no cyanosis or mottling.  LABS: CBC:    Component Value Date/Time   WBC 19.1* 06/18/2015 0520   HGB 11.4* 06/18/2015 0520   HCT 34.5* 06/18/2015 0520   PLT 304 06/18/2015 0520   MCV 88.4 06/18/2015 0520   NEUTROABS 24.1* 06/08/2015 1945   LYMPHSABS 0.6* 06/08/2015 1945   MONOABS 1.0* 06/08/2015 1945   EOSABS 0.0 06/08/2015 1945   BASOSABS 0.0 06/08/2015 1945   Comprehensive Metabolic Panel:    Component Value Date/Time   NA 141 06/18/2015 0520   K 3.9 06/18/2015 0520   CL 99* 06/18/2015 0520   CO2 36* 06/18/2015 0520   BUN 23* 06/18/2015 0520   CREATININE 0.66 06/18/2015 0520   GLUCOSE 104* 06/18/2015 0520   CALCIUM 8.6* 06/18/2015 0520   AST 32 06/12/2015 1405   ALT 39 06/12/2015 1405   ALKPHOS 57 06/12/2015 1405   BILITOT 0.8 06/12/2015 1405   PROT 5.6* 06/12/2015 1405   ALBUMIN 2.5*  06/12/2015 1405   CTa chest 3/30: IMPRESSION: 1. No evidence of acute pulmonary embolus. 2. Acute appearing comminuted and angulated fractures of the posterior right ninth through twelfth ribs with flail segments. 3. Subtotal right lower lobe consolidation. Opacified right mainstem bronchus, with multifocal retained secretions in the trachea. Reactive appearing right hilar lymphadenopathy. Top differential considerations include pneumonia  following a large aspiration versus following right rib fractures. No significant pleural effusion. 4. Spiculated left upper lobe 10 mm lung nodule suspicious for small bronchogenic carcinoma. Consider one of the following in 3 months: (a) repeat chest CT, (b) follow-up PET-CT, or (c) tissue sampling. This recommendation follows the consensus statement: Guidelines for Management of Incidental Pulmonary Nodules Detected on CT Images:From the Fleischner Society 2017; published online before print (10.1148/radiol.1610960454). 5. Severe aortic atherosclerosis. Areas of ulcerated plaque most pronounced in the descending thoracic aorta. 6. 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   CXR 4/9: 1. Support apparatus satisfactory. 2. Stable moderate size right pleural effusion and associated passive atelectasis versus pneumonia in the right lower lobe. 3. New left lower lobe atelectasis. 4. COPD/emphysema at baseline.   More than 50% of the visit was spent in counseling/coordination of care: Yes  Time Spent: 80 minutes

## 2015-06-19 NOTE — Clinical Documentation Improvement (Deleted)
Internal Medicine  Can the diagnosis of Malnutrition be further specified by acuity?  Thank you   Document Severity - Severe(third degree), Moderate (second degree), Mild (first degree)  Other condition  Unable to clinically determine    Please exercise your independent, professional judgment when responding. A specific answer is not anticipated or expected.   Thank You, Lavonda JumboLawanda J Javae Braaten [employer] Information Management [place] [phone]

## 2015-06-19 NOTE — Progress Notes (Signed)
Patient has remained on BiPAP at 40% throughout the night and O2 saturation has remained in the high 90's.  Patient has been alert and oriented to self and place only and all other vital signs have remained stable.  Patient has rested well with no complaints of pain.  Report given to day shift RN.

## 2015-06-19 NOTE — Progress Notes (Signed)
Nutrition Follow-up     INTERVENTION:   Medical Food Supplement Therapy: family reports pt loves Ensure; will send Ensure Enlive po TID between meals, each supplement provides 350 kcal and 20 grams of protein Meals and Snacks: Cater to patient preferences  NUTRITION DIAGNOSIS:   Inadequate oral intake related to acute illness, poor appetite as evidenced by NPO status, meal completion < 25%.  GOAL:   Provide needs based on ASPEN/SCCM guidelines  MONITOR:   Vent status, Labs, Weight trends, TF tolerance, I & O's  REASON FOR ASSESSMENT:   Consult Assessment of nutrition requirement/status  ASSESSMENT:   [age] uo female admitted with AMS, acute respiratory failure with pneumonia, COPD with acute bronchospasm, admitted 06/07/15, required intubation on 06/11/15  Pt s/p extubation on 4/9, currently on [place]  Diet Order:  Diet heart healthy/carb modified Room service appropriate?: Yes; Fluid consistency:: Thin   Energy Intake: recorded po intake 75% at breakfast this AM, pt on Bipap yesterday and did not eat lunch or dinner meal. Family reports appetite has been down, but they feel that pt is doing well today  Skin:  Reviewed, no issues  Last BM:  06/18/15   Labs: reviewed  Meds: NS at 75 ml/hr  Height:   Ht Readings from Last 1 Encounters:  06/11/15 5\' 6"  (1.676 m)    Weight:   Wt Readings from Last 1 Encounters:  06/18/15 125 lb (56.7 kg)    Filed Weights   06/16/15 0500 06/17/15 0500 06/18/15 0500  Weight: 126 lb 5.2 oz (57.3 kg) 126 lb 15.8 oz (57.6 kg) 125 lb (56.7 kg)    BMI:  Body mass index is 20.19 kg/(m^2).  Estimated Nutritional Needs:   Kcal:  1425-1710 kcals   Protein:  63-74 g (1.1-1.3 g/kg)   Fluid:  >1.5 L  EDUCATION NEEDS:   Education needs no appropriate at this time  Romelle StarcherCate Nickolette Espinola MS, RD, LDN [phone] Pager  [phone] Weekend/On-Call Pager

## 2015-06-19 NOTE — Evaluation (Signed)
Physical Therapy Re-Evaluation Patient Details Name: Leah [name] MRN: [id number] DOB: 1933-01-30 Today's Date: 06/19/2015   History of Present Illness  Pt is a 82 y.o. female with PMH of dementia, COPD, HTN and fx ribs 9-12.  Pt presented with weakness, SOB, cough and a fall.  Pt was admitted for aspiration pneumonia, acute respiratory failure with hypoxia and COPD exacerbation(06-07-15).  Pt was initially admitted to the ICU, transfered to the general floor 06-10-15, and then transferred back to the ICU on 06-11-15.  Pt was placed on a on a ventilator (06-11-15) and extubated 06-17-15.     Clinical Impression  Pt was initially evaluated 06-09-15, and new PT consult on 06-18-15 for re-eval.  Pt was only oriented to self; as a result, pt could not give a detailed history.  Pt performed AROM exercises for the LE and UE in bed.  Pt occassionally followed one step commands.  Pt was max assist x2 for rolling in bed.  Due to aforementioned function and strength deficits, pt is in need of skilled physical therapy.  It is recommended that pt is discharged to SNF when medically appropriate.     Follow Up Recommendations SNF    Equipment Recommendations   (TBD further assessment )    Recommendations for Other Services       Precautions / Restrictions Precautions Precautions: Fall Precaution Comments: R IJ tube catheter, aspiration precautions,   Restrictions Weight Bearing Restrictions: No      Mobility  Bed Mobility Overal bed mobility: Needs Assistance Bed Mobility: Rolling Rolling: Max assist;+2 for physical assistance         General bed mobility comments: Pt required VC's and tactile cues for UE placement,   Transfers                 General transfer comment: was not assessed due to max assist with bed mobility   Ambulation/Gait                Stairs            Wheelchair Mobility    Modified Rankin (Stroke Patients Only)       Balance                                              Pertinent Vitals/Pain Pain Assessment: No/denies pain  See flow sheet for vitals.     Home Living Family/patient expects to be discharged to:: Skilled nursing facility Living Arrangements: Children               Additional Comments: Taken from previous notes     Prior Function Level of Independence: Needs assistance         Comments: Pt is unable to answer questions but appears to need assistance      Hand Dominance        Extremity/Trunk Assessment   Upper Extremity Assessment: Generalized weakness  pt would not flex shoulders.    shoulder abductors, elbow flexors, elbow extensors:  B At least a 3/5  Grip: B decreased       Lower Extremity Assessment: Generalized weakness  pt would not attempt knee flexion, hip abduction, hip adduction, nor leg extension.  Hip flexors, dorsiflexors: B at least a 3/5   Cervical / Trunk Assessment: Normal  Communication   Communication: Expressive difficulties  Cognition Arousal/Alertness: Awake/alert Behavior During Therapy:  WFL for tasks assessed/performed Overall Cognitive Status: No family/caregiver present to determine baseline cognitive functioning       Memory: Decreased short-term memory (oriented to person only, did not know date, place, or president)              General Comments   Nursing was contacted and cleared pt for physical therapy.  Pt was agreeable and session was modified due to fatigue.       Exercises        Assessment/Plan    PT Assessment Patient needs continued PT services  PT Diagnosis Generalized weakness   PT Problem List Decreased strength;Decreased activity tolerance;Decreased balance;Decreased mobility;Decreased cognition;Decreased knowledge of use of DME;Decreased safety awareness  PT Treatment Interventions DME instruction;Gait training;Functional mobility training;Therapeutic activities;Therapeutic exercise;Balance  training;Cognitive remediation;Patient/family education   PT Goals (Current goals can be found in the Care Plan section) Acute Rehab PT Goals Patient Stated Goal: unable PT Goal Formulation: Patient unable to participate in goal setting Time For Goal Achievement: 06/23/15 Potential to Achieve Goals: Fair    Frequency Min 2X/week   Barriers to discharge        Co-evaluation               End of Session Equipment Utilized During Treatment: Oxygen Activity Tolerance: Patient limited by fatigue Patient left: with bed alarm set;with call bell/phone within reach Nurse Communication: Mobility status         Time: [id number] PT Time Calculation (min) (ACUTE ONLY): 20 min   Charges:         PT G Codes:       Lyndel SafeGarrett Kelsie Zaborowski, SPT  Lyndel SafeGarrett Adryana Mogensen 06/19/2015, 11:59 AM

## 2015-06-19 NOTE — Progress Notes (Signed)
[facility] Physicians - [place] at [facility]   PATIENT NAME: Leah [name]    MR#:  [id number]  DATE OF BIRTH:  26-Jun-1932  SUBJECTIVE:  CHIEF COMPLAINT:   Chief Complaint  Patient presents with  . Failure To Thrive  . Fall   Demented, she was put on BIPAP yesterday due to desat on O2 [place].  REVIEW OF SYSTEMS:  Unable to get ROS due to dementia.  DRUG ALLERGIES:  No Known Allergies  VITALS:  Blood pressure 93/62, pulse 64, temperature 98.2 F (36.8 C), temperature source Axillary, resp. rate 22, height  (1.676 m), weight 56.7 kg (125 lb), SpO2 95 %.  PHYSICAL EXAMINATION:  GENERAL:  82 y.o.-year-old patient lying in the bed with no acute distress.  EYES: Pupils equal, round, reactive to light and accommodation. No scleral icterus. Extraocular muscles intact.  HEENT: Head atraumatic, normocephalic.  NECK:  Supple, no jugular venous distention. No thyroid enlargement, no tenderness.  LUNGS: Very diminished breath sounds bilaterally, no wheezing, rales,rhonchi or crepitation. No use of accessory muscles of respiration.  CARDIOVASCULAR: S1, S2 normal. No murmurs, rubs, or gallops.  ABDOMEN: Soft, nontender, nondistended. Bowel sounds present. No organomegaly or mass.  EXTREMITIES: No pedal edema, cyanosis, or clubbing.  NEUROLOGIC: unable to exam.  PSYCHIATRIC: The patient is demented. SKIN: No obvious rash, lesion, or ulcer.    LABORATORY PANEL:   CBC  Recent Labs Lab 06/18/15 0520  WBC 19.1*  HGB 11.4*  HCT 34.5*  PLT 304   ------------------------------------------------------------------------------------------------------------------  Chemistries   Recent Labs Lab 06/12/15 1405  06/18/15 0520  NA 142  < > 141  K 4.1  < > 3.9  CL 106  < > 99*  CO2 35*  < > 36*  GLUCOSE 162*  < > 104*  BUN 28*  < > 23*  CREATININE 0.75  < > 0.66  CALCIUM 8.4*  < > 8.6*  MG  --   < > 1.8  AST 32  --   --   ALT 39  --   --   ALKPHOS 57  --   --    BILITOT 0.8  --   --   < > = values in this interval not displayed. ------------------------------------------------------------------------------------------------------------------  Cardiac Enzymes No results for input(s): TROPONINI in the last 168 hours. ------------------------------------------------------------------------------------------------------------------  RADIOLOGY:  No results found.  EKG:   Orders placed or performed during the hospital encounter of 06/07/15  . ED EKG  . ED EKG  . EKG 12-Lead  . EKG 12-Lead    ASSESSMENT AND PLAN:   1. Acute respiratory Failure due to COPD exacerbation She was on Full MV support, s/p extubation yesterday. Try to wean off BIPAP to HF O2. Continue pulmicort and NEB.  2.COPD exacerbation Continue IV solumedrol. Changed from zosyn to augmentin.  3. Dehydration. Better. IV NS. 4. Leukocytosis. Possible due to steroid. 5. Dementia. Aspiration precaution.  I discussed with Dr. Bard Herbert. All the records are reviewed and case discussed with Care Management/Social Workerr. Management plans discussed with the patient, family and they are in agreement.  CODE STATUS: DNR.  TOTAL CRITICAL TIME TAKING CARE OF THIS PATIENT: 38 minutes.  Greater than 50% time was spent on coordination of care and face-to-face counseling.  POSSIBLE D/C IN 3 DAYS, DEPENDING ON CLINICAL CONDITION.   Shaune Pollack M.D on 06/19/2015 at 8:03 AM  Between 7am to 6pm - Pager - [phone]  After 6pm go to [email or web address] - password [email or web address] [facility]  [place] [facility]  Office  [phone]  CC: Primary care physician; Jaclyn ShaggyATE,DENNY C, MD

## 2015-06-20 LAB — CBC
HCT: 35.5 % (ref 35.0–47.0)
Hemoglobin: 11.6 g/dL — ABNORMAL LOW (ref 12.0–16.0)
MCH: 28.8 pg (ref 26.0–34.0)
MCHC: 32.8 g/dL (ref 32.0–36.0)
MCV: 87.7 fL (ref 80.0–100.0)
PLATELETS: 360 10*3/uL (ref 150–440)
RBC: 4.05 MIL/uL (ref 3.80–5.20)
RDW: 14.4 % (ref 11.5–14.5)
WBC: 12.3 10*3/uL — ABNORMAL HIGH (ref 3.6–11.0)

## 2015-06-20 LAB — BASIC METABOLIC PANEL
Anion gap: 4 — ABNORMAL LOW (ref 5–15)
BUN: 19 mg/dL (ref 6–20)
CALCIUM: 8.4 mg/dL — AB (ref 8.9–10.3)
CHLORIDE: 103 mmol/L (ref 101–111)
CO2: 32 mmol/L (ref 22–32)
CREATININE: 0.45 mg/dL (ref 0.44–1.00)
GFR calc Af Amer: >60 mL/min (ref >60)
GFR calc non Af Amer: >60 mL/min (ref >60)
GLUCOSE: 87 mg/dL (ref 65–99)
Potassium: 3.3 mmol/L — ABNORMAL LOW (ref 3.5–5.1)
Sodium: 139 mmol/L (ref 135–145)

## 2015-06-20 LAB — GLUCOSE, CAPILLARY: Glucose-Capillary: 90 mg/dL (ref 65–99)

## 2015-06-20 LAB — MAGNESIUM: MAGNESIUM: 1.8 mg/dL (ref 1.7–2.4)

## 2015-06-20 MED ORDER — MORPHINE SULFATE (CONCENTRATE) 10 MG/0.5ML PO SOLN
5.0000 mg | Freq: Four times a day (QID) | ORAL | Status: DC
  Administered 2015-06-20 – 2015-06-21 (×2): 5 mg via ORAL
  Filled 2015-06-20: qty 0.5

## 2015-06-20 MED ORDER — BISACODYL 10 MG RE SUPP: 10.0000 mg | Freq: Every day | RECTAL | Status: AC | PRN

## 2015-06-20 MED ORDER — IPRATROPIUM-ALBUTEROL 0.5-2.5 (3) MG/3ML IN SOLN: 3.0000 mL | RESPIRATORY_TRACT | Status: DC | PRN

## 2015-06-20 MED ORDER — PROCHLORPERAZINE 25 MG RE SUPP: 25.0000 mg | Freq: Three times a day (TID) | RECTAL | Status: DC | PRN

## 2015-06-20 MED ORDER — SENNOSIDES-DOCUSATE SODIUM 8.6-50 MG PO TABS: 1.0000 | ORAL_TABLET | Freq: Two times a day (BID) | ORAL | Status: AC

## 2015-06-20 MED ORDER — MORPHINE SULFATE (CONCENTRATE) 10 MG/0.5ML PO SOLN
5.0000 mg | ORAL | Status: DC | PRN
  Filled 2015-06-20: qty 0.5

## 2015-06-20 MED ORDER — POTASSIUM CHLORIDE 10 MEQ/100ML IV SOLN
10.0000 meq | INTRAVENOUS | Status: AC
  Administered 2015-06-20 (×4): 10 meq via INTRAVENOUS
  Filled 2015-06-20 (×4): qty 100

## 2015-06-20 MED ORDER — POTASSIUM CHLORIDE CRYS ER 20 MEQ PO TBCR: 40.0000 meq | EXTENDED_RELEASE_TABLET | Freq: Once | ORAL | Status: DC

## 2015-06-20 MED ORDER — IPRATROPIUM-ALBUTEROL 0.5-2.5 (3) MG/3ML IN SOLN: 3.0000 mL | RESPIRATORY_TRACT | Status: AC | PRN

## 2015-06-20 MED ORDER — ACETAMINOPHEN 325 MG PO TABS: 650.0000 mg | ORAL_TABLET | ORAL | Status: DC | PRN

## 2015-06-20 MED ORDER — MORPHINE SULFATE (CONCENTRATE) 10 MG/0.5ML PO SOLN: 5.0000 mg | ORAL | Status: DC | PRN

## 2015-06-20 MED ORDER — ACETAMINOPHEN 325 MG PO TABS: 650.0000 mg | ORAL_TABLET | ORAL | Status: AC | PRN

## 2015-06-20 MED ORDER — LORAZEPAM 0.5 MG PO TABS: 0.5000 mg | ORAL_TABLET | ORAL | Status: DC | PRN

## 2015-06-20 MED ORDER — MORPHINE SULFATE (CONCENTRATE) 10 MG/0.5ML PO SOLN: 5.0000 mg | Freq: Four times a day (QID) | ORAL | Status: DC

## 2015-06-20 NOTE — Care Management (Signed)
Informed by palliative care that patient is now comfort care and to move out of icu.

## 2015-06-20 NOTE — Progress Notes (Addendum)
Comfortable. No complaints  Filed Vitals:   06/20/15 0837 06/20/15 0900 06/20/15 1000 06/20/15 1100  BP:  109/64 100/73 117/79  Pulse:  80 79 82  Temp:      TempSrc:      Resp:  31 43 46  Height:      Weight:      SpO2: 98% 87% 96% 96%   NAD No JVD No wheezes Reg, no M NABS, soft Ext warm, no edema  BMP Latest Ref Rng 06/20/2015 06/18/2015 06/17/2015  Glucose 65 - 99 mg/dL 87 161(W104(H) 960(A147(H)  BUN 6 - 20 mg/dL 19 54(U23(H) 98(J26(H)  Creatinine 0.44 - 1.00 mg/dL 1.910.45 4.780.66 2.950.56  Sodium 135 - 145 mmol/L 139 141 137  Potassium 3.5 - 5.1 mmol/L 3.3(L) 3.9 4.5  Chloride 101 - 111 mmol/L 103 99(L) 97(L)  CO2 22 - 32 mmol/L 32 36(H) 36(H)  Calcium 8.9 - 10.3 mg/dL 6.2(Z8.4(L) 3.0(Q8.6(L) 6.5(H8.4(L)    CBC Latest Ref Rng 06/20/2015 06/18/2015 06/17/2015  WBC 3.6 - 11.0 K/uL 12.3(H) 19.1(H) 16.7(H)  Hemoglobin 12.0 - 16.0 g/dL 11.6(L) 11.4(L) 10.8(L)  Hematocrit 35.0 - 47.0 % 35.5 34.5(L) 32.4(L)  Platelets 150 - 440 K/uL 360 304 295    No new CXR  IMPRESSION: Acute on chronic respiratory failure. Extubated 04/09 COPD with resolving acute exacerbation Suspicion for aspiration PNA Advanced dementia DNR/DNI  PLAN/REC: Continue nebulized steroids and bronchodilators Cont supplemental O2 Cont Augmentin - complete 5 days Discharge planning - ?[facility]. Would consider Hospice after discharge Agree with transfer to med-surg Would DC on long term nebulized steroids and bronchodilators   Discussed with Dr Imogene Burnhen PCCM will sign off. Please call if we can be of further assistance  Leah Fischeravid Chancie Lampert, MD PCCM service Mobile [phone] Pager [phone] 06/20/2015

## 2015-06-20 NOTE — Progress Notes (Addendum)
Palliative Medicine Inpatient Consult Follow Up Note   Name: Leah [name] Date: 06/20/2015 MRN: [id number]  DOB: 1932-07-15  Referring Physician: Shaune Pollack, MD  Palliative Care consult requested for this 82 y.o. female for goals of medical therapy in patient with acute on chronic resp failure due to end stage emphysema/ COPD.   TODAY'S DISCUSSIONS AND DECISIONS: I was able to meet with pt's two sons: Siri Cole and Peyton Najjar.  The daughter is apparently 'out of the picture'. She was the one living with pt but brothers say 'she cannot take care of mom and is not involved actively anymore due to her own problems she has'.    Siri Cole is the primary son who has been taking care of his mother's business affairs and other affairs for many years.  He is the primary decision maker though no one has HCPOA. Peyton Najjar was supportive and both agreed on each decision made today.  After a long talk last pm by phone and after a private in-person meeting with the two sons today, they both agree that pt would be best served at this point by being 'comfort care'. They want me to see her tomorrow am and wait until then to decide about Hospice Home at that time.  At first they talked about 'observing her for several days and then, if not doing better, sending her to [facility]. But I let them know that we will need to make decisions faster than that.    Pt has tachypnea and is getting IV morphine --2 mg helped by report --but not enough.  She may end up needing a morphine drip in order to get her resp rate into the comfort zone of 20-30 instead of 40-50.  Sons understand that morphine is being used to interrupt the air-hunger signal from the lungs to the brain --to avoid the sensation of smothering on the part of the pt.  I have changed her inpt meds and also have done the DC med rec already.  DNR form is in the paper chart.   I think it is highly likely that pt will go to Hospice home tomorrow. BUT, sons want me to  reassess her FIRST tomorrow . Also --she has to be stable to go and she could decompensate again. She is not to be re-intubated or to have more BIPAP or Hi Flow.    See orders and DC meds.  I have updated Dr Imogene Burn, attending.      IMPRESSION: Septic Shock Acute on chronic respiratory failure with hypoxemia and hypercapnia ---was intubated when failed BIPAP ---extubated on 4/9.  End stage emphysema/ COPD ----unable to remain off of BIPAP for very long ----currently prn doses of morphine and ativan have not helped HR and RR Suspected Aspiration Pneumonia (RUL) Hypophosphatemia Hypokalemia HTN Anxiety Dementia of alzheimer's type --oriented to person and place but confused on other matters most of the time Imaging shows a left upper lung nodule suspicious for bronchogenic CA. Rib Fxs (likely from falls) Severe Malnutrition ---present at adm ---albumin is 2.5 (on 4/417) Dysphagia Gait disorder with fall two days prior to admission     REVIEW OF SYSTEMS:  Patient is not able to provide ROS due to dementia and distress  CODE STATUS: DNR   PAST MEDICAL HISTORY: Past Medical History  Diagnosis Date  . Dementia   . COPD (chronic obstructive pulmonary disease) (HCC)   . Essential hypertension     PAST SURGICAL HISTORY: History reviewed. No pertinent past surgical history.  Vital Signs: BP 114/101 mmHg  Pulse 96  Temp(Src) 98 F (36.7 C) (Oral)  Resp 41  Ht 5\' 6"  (1.676 m)  Wt 56.7 kg (125 lb)  BMI 20.19 kg/m2  SpO2 90% Filed Weights   06/16/15 0500 06/17/15 0500 06/18/15 0500  Weight: 57.3 kg (126 lb 5.2 oz) 57.6 kg (126 lb 15.8 oz) 56.7 kg (125 lb)    Estimated body mass index is 20.19 kg/(m^2) as calculated from the following:   Height as of this encounter: 5\' 6"  (1.676 m).   Weight as of this encounter: 56.7 kg (125 lb).  PHYSICAL EXAM: Lying in ICU bed with nasal cannula in place and with resp rate as high as 52 I have not seen her resp rate lower than 40  so far today. Staff says she seems 'ok' as long as she does not get a bed bath --bed baths make her decompensate and then she ends up back on BIPAP. Was on BIPAP yesterday from about 4 pm until about 11 pm after her bath yesterday. She is confused and nonverbal No JVD or TM Hrt distant reg rate Lungs with rapid resp rate --no wheezing heard this time Abd soft and NT Ext no mottling or cyanosis as yet   LABS: CBC:    Component Value Date/Time   WBC 12.3* 06/20/2015 0737   HGB 11.6* 06/20/2015 0737   HCT 35.5 06/20/2015 0737   PLT 360 06/20/2015 0737   MCV 87.7 06/20/2015 0737   NEUTROABS 24.1* 06/08/2015 1945   LYMPHSABS 0.6* 06/08/2015 1945   MONOABS 1.0* 06/08/2015 1945   EOSABS 0.0 06/08/2015 1945   BASOSABS 0.0 06/08/2015 1945   Comprehensive Metabolic Panel:    Component Value Date/Time   NA 139 06/20/2015 0737   K 3.3* 06/20/2015 0737   CL 103 06/20/2015 0737   CO2 32 06/20/2015 0737   BUN 19 06/20/2015 0737   CREATININE 0.45 06/20/2015 0737   GLUCOSE 87 06/20/2015 0737   CALCIUM 8.4* 06/20/2015 0737   AST 32 06/12/2015 1405   ALT 39 06/12/2015 1405   ALKPHOS 57 06/12/2015 1405   BILITOT 0.8 06/12/2015 1405   PROT 5.6* 06/12/2015 1405   ALBUMIN 2.5* 06/12/2015 1405     More than 50% of the visit was spent in counseling/coordination of care: YES  Time Spent:  65 min

## 2015-06-20 NOTE — Progress Notes (Addendum)
[facility] Physicians - [place] at [facility]   PATIENT NAME: Leah [name]    MR#:  [id number]  DATE OF BIRTH:  1932/08/12  SUBJECTIVE:  CHIEF COMPLAINT:   Chief Complaint  Patient presents with  . Failure To Thrive  . Fall   Demented, off BIPAP and on O2 [place] 3 L.  REVIEW OF SYSTEMS:  Unable to get ROS due to dementia.  DRUG ALLERGIES:  No Known Allergies  VITALS:  Blood pressure 123/79, pulse 74, temperature 98.2 F (36.8 C), temperature source Oral, resp. rate 30, height  (1.676 m), weight 56.7 kg (125 lb), SpO2 98 %.  PHYSICAL EXAMINATION:  GENERAL:  82 y.o.-year-old patient lying in the bed with no acute distress.  EYES: Pupils equal, round, reactive to light and accommodation. No scleral icterus. Extraocular muscles intact.  HEENT: Head atraumatic, normocephalic.  NECK:  Supple, no jugular venous distention. No thyroid enlargement, no tenderness.  LUNGS: Very diminished breath sounds bilaterally, no wheezing, rales,rhonchi or crepitation. No use of accessory muscles of respiration.  CARDIOVASCULAR: S1, S2 normal. No murmurs, rubs, or gallops.  ABDOMEN: Soft, nontender, nondistended. Bowel sounds present. No organomegaly or mass.  EXTREMITIES: No pedal edema, cyanosis, or clubbing.  NEUROLOGIC: Cranial nerves II through XII are intact. Muscle strength 3-4/5 in all extremities. Sensation intact. Gait not checked.  PSYCHIATRIC: The patient is demented, knows her name and location. SKIN: No obvious rash, lesion, or ulcer.    LABORATORY PANEL:   CBC  Recent Labs Lab 06/20/15 0737  WBC 12.3*  HGB 11.6*  HCT 35.5  PLT 360   ------------------------------------------------------------------------------------------------------------------  Chemistries   Recent Labs Lab 06/20/15 0737  NA 139  K 3.3*  CL 103  CO2 32  GLUCOSE 87  BUN 19  CREATININE 0.45  CALCIUM 8.4*  MG 1.8    ------------------------------------------------------------------------------------------------------------------  Cardiac Enzymes No results for input(s): TROPONINI in the last 168 hours. ------------------------------------------------------------------------------------------------------------------  RADIOLOGY:  No results found.  EKG:   Orders placed or performed during the hospital encounter of 06/07/15  . ED EKG  . ED EKG  . EKG 12-Lead  . EKG 12-Lead    ASSESSMENT AND PLAN:   1. Acute respiratory Failure due to COPD exacerbation She was on Full MV support, s/p extubation yesterday. Weaned off BIPAP to O2 [place] 3L. Continue pulmicort and NEB.  2.COPD exacerbation and Suspicion for aspiration PNA  IV solumedrol was discontinued. Changed from zosyn to augmentin. For 5 days.  * spiculated lung nodule-this was noted on the CT chest. Follow up as outpatient.  3. Dehydration. Improved with IV NS. 4. Leukocytosis. Possible due to steroid. Improving. 5. Dementia. Aspiration precaution.  * Hypokalemia. KCl po. F/u K. * HTN. Controlled without medication. * septic shock on presentation, she was on levophed drip and zosyn. Improved.  PT evaluation.  I discussed with Dr. Bard Herbert. He suggested hospice after discharge. All the records are reviewed and case discussed with Care Management/Social Workerr. Management plans discussed with the patient, family and they are in agreement.  CODE STATUS: DNR.  TOTAL TIME TAKING CARE OF THIS PATIENT: 37 minutes.  Greater than 50% time was spent on coordination of care and face-to-face counseling.  POSSIBLE D/C IN 2 DAYS, DEPENDING ON CLINICAL CONDITION.   Shaune Pollack M.D on 06/20/2015 at 10:19 AM  Between 7am to 6pm - Pager - [phone]  After 6pm go to [email or web address] - password [email or web address] [facility]  [place]  Office  [phone]  CC: Primary care physician;  Jaclyn ShaggyATE,DENNY C, MD

## 2015-06-21 DIAGNOSIS — L8990 Pressure ulcer of unspecified site, unspecified stage: Secondary | ICD-10-CM | POA: Insufficient documentation

## 2015-06-21 MED ORDER — MORPHINE SULFATE (CONCENTRATE) 10 MG/0.5ML PO SOLN
ORAL | Status: AC
  Filled 2015-06-21: qty 0.5

## 2015-06-21 MED ORDER — SENNOSIDES-DOCUSATE SODIUM 8.6-50 MG PO TABS
1.0000 | ORAL_TABLET | Freq: Two times a day (BID) | ORAL | Status: DC
  Administered 2015-06-21: 1 via ORAL
  Filled 2015-06-21: qty 1

## 2015-06-21 NOTE — Discharge Instructions (Signed)
Hospice care

## 2015-06-21 NOTE — Care Management (Signed)
Discharge to Hospice Home today per Dr. Marya Landryhen/Campbell. Dayna BarkerKaren Robertson. RN representative for  [facility] updated.  Family members are in agreement Transportation will be arranged through [place] [facility] Gwenette GreetBrenda S Aariyana Manz RN MSN CCM Care Management [phone]

## 2015-06-21 NOTE — Progress Notes (Signed)
New hospice home referral received from [place] following a Palliative Medicine consult with Dr. Megan Salon. Ms. Martinique is an 82 year old woman admitted to [facility] on 3/30 for evaluation and treatment of sepsis related to pneumonia/COPD exacerbation. PMH includes: essential HTN and dementia. Through out this hospital course she has required Bipap, progressing to intubation and required blood pressor support with levophed.  Chest CT on 3/30 showed broken ribs (right posterior) and a spiculated left upper lobe 10 mm lung nodule suspicious for small bronchogenic carcinoma, as well right lower lobe consolidation, suspected aspiration PNA.Marland Kitchen  She was extubated on 4/9, on 4/10 she developed respiratory distress, DNR status was confirmed with her son and she continued supportive COPD therapies. Dr. Megan Salon met again with patient's family on 4/11 and discussed comfort care, today her son agreed to comfort care at the hospice home. Ms. Martinique has received scheduled liquid morphine 5 mg every 6 hrs for management of dyspnea and pain. She continues with very poor po intake, albumin 2.5. Patient seen lying in bed, respiratory rate 32, appeared to be sleeping. She was easily awakened, oriented to person. Breakfast tray at bedside, she had eaten only bites and declined more when offered, she did drink a few sips of coffee, no difficulty swallowing noted. Writer met with patient's son Kennith Center "Jeral Fruit" Martinique to initiate education regarding hospice services, philosophy and team approach to care with good understanding voiced. Consents signed. Contact and Hospice information left with Jody. Patient information faxed to referral, report called to the hospice home. Hospital care team all aware of and in agreement with plan for discharge today via EMS with portable DNR in place. Writer to contact EMS for transport. Thank you for the opportunity to be involved in the care of this patient and her family.  Flo Shanks RN,  BSN, [place] and [facility] [place], [facility] [phone] c

## 2015-06-21 NOTE — Discharge Summary (Signed)
[facility] [place] [facility]   PATIENT NAME: Leah [name]    MR#:  [id number]  DATE OF BIRTH:  [birth year]  DATE OF ADMISSION:  06/07/2015 ADMITTING PHYSICIAN: Altamese Dilling, MD  DATE OF DISCHARGE: 06/21/2015 PRIMARY CARE PHYSICIAN: Jaclyn Shaggy, MD    ADMISSION DIAGNOSIS:  Back pain [M54.9] Community acquired pneumonia [J18.9] Hypoxia [R09.02] Urinary tract infection without hematuria, site unspecified [N39.0]   DISCHARGE DIAGNOSIS:   Acute respiratory Failure due to COPD exacerbation and  aspiration PNA  spiculated lung nodule SECONDARY DIAGNOSIS:   Past Medical History  Diagnosis Date  . Dementia   . COPD (chronic obstructive pulmonary disease) (HCC)   . Essential hypertension     HOSPITAL COURSE:   1. Acute respiratory Failure due to COPD exacerbation She was on Full MV support, s/p extubation yesterday. Weaned off BIPAP to O2 [place] 3L. Continue pulmicort and NEB.  2.COPD exacerbation and aspiration PNA IV solumedrol was discontinued. Changed from zosyn to augmentin. For 5 days.  * spiculated lung nodule-this was noted on the CT chest.  3. Dehydration. Improved with IV NS. 4. Leukocytosis. Possible due to steroid. Improving. 5. Dementia. Aspiration precaution.  * Hypokalemia. KCl po. * HTN. Controlled without medication. * septic shock on presentation, she was on levophed drip and zosyn. Improved.  DISCHARGE CONDITIONS:   Poor prognosis, discharge to hospice home.  CONSULTS OBTAINED:     DRUG ALLERGIES:  No Known Allergies  DISCHARGE MEDICATIONS:   Current Discharge Medication List    START taking these medications   Details  acetaminophen (TYLENOL) 325 MG tablet Take 2 tablets (650 mg total) by mouth every 4 (four) hours as needed for mild pain or fever.    bisacodyl (DULCOLAX) 10 MG suppository Place 1 suppository (10 mg total) rectally daily as needed for moderate constipation. Qty: 6 suppository,  Refills: 0    ipratropium-albuterol (DUONEB) 0.5-2.5 (3) MG/3ML SOLN Take 3 mLs by nebulization every 2 (two) hours as needed. Qty: 30 mL    LORazepam (ATIVAN) 0.5 MG tablet Take 1 tablet (0.5 mg total) by mouth every 4 (four) hours as needed for anxiety. Qty: 15 tablet, Refills: 0    !! Morphine Sulfate (MORPHINE CONCENTRATE) 10 MG/0.5ML SOLN concentrated solution Take 0.25 mLs (5 mg total) by mouth every hour as needed for moderate pain, severe pain or shortness of breath. Qty: 30 mL, Refills: 0    !! Morphine Sulfate (MORPHINE CONCENTRATE) 10 MG/0.5ML SOLN concentrated solution Take 0.25 mLs (5 mg total) by mouth every 6 (six) hours. Qty: 30 mL, Refills: 0    prochlorperazine (COMPAZINE) 25 MG suppository Place 1 suppository (25 mg total) rectally every 8 (eight) hours as needed for nausea or vomiting. Qty: 6 suppository, Refills: 0    senna-docusate (SENOKOT-S) 8.6-50 MG tablet Take 1 tablet by mouth 2 (two) times daily.     !! - Potential duplicate medications found. Please discuss with provider.    STOP taking these medications     donepezil (ARICEPT) 5 MG tablet      hydrochlorothiazide (HYDRODIURIL) 25 MG tablet      ibuprofen (ADVIL,MOTRIN) 200 MG tablet      verapamil (CALAN-SR) 240 MG CR tablet          DISCHARGE INSTRUCTIONS:    If you experience worsening of your admission symptoms, develop shortness of breath, life threatening emergency, suicidal or homicidal thoughts you must seek medical attention immediately by calling 911 or calling your MD immediately  if symptoms  less severe.  You Must read complete instructions/literature along with all the possible adverse reactions/side effects for all the Medicines you take and that have been prescribed to you. Take any new Medicines after you have completely understood and accept all the possible adverse reactions/side effects.   Please note  You were cared for by a hospitalist during your hospital stay. If you  have any questions about your discharge medications or the care you received while you were in the hospital after you are discharged, you can call the unit and asked to speak with the hospitalist on call if the hospitalist that took care of you is not available. Once you are discharged, your primary care physician will handle any further medical issues. Please note that NO REFILLS for any discharge medications will be authorized once you are discharged, as it is imperative that you return to your primary care physician (or establish a relationship with a primary care physician if you do not have one) for your aftercare needs so that they can reassess your need for medications and monitor your lab values.    Today   SUBJECTIVE   demented   VITAL SIGNS:  Blood pressure 100/69, pulse 74, temperature 98.4 F (36.9 C), temperature source Oral, resp. rate 16, height 5\' 6"  (1.676 m), weight 56.7 kg (125 lb), SpO2 91 %.  I/O:   Intake/Output Summary (Last 24 hours) at 06/21/15 0923 Last data filed at 06/21/15 0500  Gross per 24 hour  Intake    640 ml  Output   1725 ml  Net  -1085 ml    PHYSICAL EXAMINATION:  GENERAL:  82 y.o.-year-old patient lying in the bed with no acute distress.  EYES:  No scleral icterus. Extraocular muscles intact.  HEENT: Head atraumatic, normocephalic.  NECK:  Supple, no jugular venous distention.  LUNGS: Normal breath sounds bilaterally, no wheezing, rales,rhonchi or crepitation. No use of accessory muscles of respiration.  CARDIOVASCULAR: S1, S2 normal. No murmurs, rubs, or gallops.  ABDOMEN: Soft, non-tender, non-distended. Bowel sounds present.  EXTREMITIES: No pedal edema, cyanosis, or clubbing.  NEUROLOGIC:demented, unable to exam PSYCHIATRIC: The patient is demented SKIN: No obvious rash, lesion, or ulcer.   DATA REVIEW:   CBC  Recent Labs Lab 06/20/15 0737  WBC 12.3*  HGB 11.6*  HCT 35.5  PLT 360    Chemistries   Recent Labs Lab  06/20/15 0737  NA 139  K 3.3*  CL 103  CO2 32  GLUCOSE 87  BUN 19  CREATININE 0.45  CALCIUM 8.4*  MG 1.8    Cardiac Enzymes No results for input(s): TROPONINI in the last 168 hours.  Microbiology Results  Results for orders placed or performed during the hospital encounter of 06/07/15  Culture, blood (routine x 2)     Status: None   Collection Time: 06/07/15 11:32 AM  Result Value Ref Range Status   Specimen Description BLOOD LEFT ASSIST CONTROL  Final   Special Requests   Final    BOTTLES DRAWN AEROBIC AND ANAEROBIC 10CCAERO,6CCANA   Culture NO GROWTH 5 DAYS  Final   Report Status 06/12/2015 FINAL  Final  MRSA PCR Screening     Status: None   Collection Time: 06/08/15  1:00 AM  Result Value Ref Range Status   MRSA by PCR NEGATIVE NEGATIVE Final    Comment:        The GeneXpert MRSA Assay (FDA approved for NASAL specimens only), is one component of a comprehensive MRSA colonization surveillance program.  It is not intended to diagnose MRSA infection nor to guide or monitor treatment for MRSA infections.     RADIOLOGY:  No results found.    I discussed with Dr. Orvan Falconer.  Management plans discussed with the patient, family and they are in agreement.  CODE STATUS:     Code Status Orders        Start     Ordered   06/20/15 1653  Do not attempt resuscitation (DNR)   Continuous    Question Answer Comment  In the event of cardiac or respiratory ARREST Do not call a "code blue"   In the event of cardiac or respiratory ARREST Do not perform Intubation, CPR, defibrillation or ACLS   In the event of cardiac or respiratory ARREST Use medication by any route, position, wound care, and other measures to relive pain and suffering. May use oxygen, suction and manual treatment of airway obstruction as needed for comfort.   Comments DNR and DNI. Also NO BIPAP and NO HI FLOW O2.  Face mask and nasal cannula oxygen is ok      06/20/15 1653    Code Status History     Date Active Date Inactive Code Status Order ID Comments User Context   06/19/2015  5:29 PM 06/20/2015  4:53 PM DNR [id number]  Suan Halter, MD Inpatient   06/12/2015 10:26 AM 06/19/2015  5:29 PM DNR [id number]  Erin Fulling, MD Inpatient   06/08/2015  2:51 AM 06/12/2015 10:26 AM Full Code [id number]  Houston Siren, MD ED      TOTAL TIME TAKING CARE OF THIS PATIENT: 33 minutes.    Shaune Pollack M.D on 06/21/2015 at 9:23 AM  Between 7am to 6pm - Pager - [phone]  After 6pm go to [email or web address] - password [email or web address] [facility]  [place]  Office  [phone]  CC: Primary care physician; Jaclyn Shaggy, MD

## 2015-06-21 NOTE — Progress Notes (Signed)
Ems here to transport pt to hospice home. No distress

## 2015-06-21 NOTE — Clinical Social Work Note (Signed)
CSW consulted for hospice home placement. RNCM facilitated discharge. CSW is signing off.   Dede QuerySarah Novali Vollman, MSW, LCSW  Clinical Social Worker  [phone]

## 2015-06-21 NOTE — Progress Notes (Signed)
Palliative Medicine Inpatient Consult Follow Up Note   Name: Leah [name] Date: 06/21/2015 MRN: [id number]  DOB: 02/19/1933  Referring Physician: Shaune PollackQing Chen, MD  Palliative Care consult requested for this 82 y.o. female for goals of medical therapy in patient with acute on chronic resp failure due to emphysema.   TODAY'S PLAN I called son after seeing pt and he approves of us calling Hospice for a Hospice Home referral.  He will be here around 11 am. Diagnosis is end stage Emphysema.   I have already completed the DC meds and the DNR form is in the paper chart.   IMPRESSION: Septic Shock Acute on chronic respiratory failure with hypoxemia and hypercapnia ---was intubated when failed BIPAP ---extubated on 4/9.  End stage emphysema/ COPD ----unable to remain off of BIPAP for very long ----currently prn doses of morphine and ativan have not helped HR and RR Suspected Aspiration Pneumonia (RUL) Hypophosphatemia Hypokalemia HTN Anxiety Dementia of alzheimer's type --oriented to person and place but confused on other matters most of the time Imaging shows a left upper lung nodule suspicious for bronchogenic CA. Rib Fxs (likely from falls) Severe Malnutrition ---present at adm ---albumin is 2.5 (on 4/417) Dysphagia Gait disorder with fall two days prior to admission    CODE STATUS: DNR   PAST MEDICAL HISTORY: Past Medical History  Diagnosis Date  . Dementia   . COPD (chronic obstructive pulmonary disease) (HCC)   . Essential hypertension     PAST SURGICAL HISTORY: History reviewed. No pertinent past surgical history.  Vital Signs: BP 100/69 mmHg  Pulse 74  Temp(Src) 98.4 F (36.9 C) (Oral)  Resp 16  Ht 5\' 6"  (1.676 m)  Wt 56.7 kg (125 lb)  BMI 20.19 kg/m2  SpO2 91% Filed Weights   06/16/15 0500 06/17/15 0500 06/18/15 0500  Weight: 57.3 kg (126 lb 5.2 oz) 57.6 kg (126 lb 15.8 oz) 56.7 kg (125 lb)    Estimated body mass index is 20.19 kg/(m^2) as  calculated from the following:   Height as of this encounter: 5\' 6"  (1.676 m).   Weight as of this encounter: 56.7 kg (125 lb).  PHYSICAL EXAM: Lying in medical bed Sleeping Moans at times FINALLY has a respiratory rate that is consistent with comfort  (it had been >40 x 2 days and more)  --due to routine morphine Eyes closed No JVD or TM Hrt rrr no m Lungs with ronchi  Abd soft and NT Ext no mottling or cyanosis VERY long and thick toenails  LABS: CBC:    Component Value Date/Time   WBC 12.3* 06/20/2015 0737   HGB 11.6* 06/20/2015 0737   HCT 35.5 06/20/2015 0737   PLT 360 06/20/2015 0737   MCV 87.7 06/20/2015 0737   NEUTROABS 24.1* 06/08/2015 1945   LYMPHSABS 0.6* 06/08/2015 1945   MONOABS 1.0* 06/08/2015 1945   EOSABS 0.0 06/08/2015 1945   BASOSABS 0.0 06/08/2015 1945   Comprehensive Metabolic Panel:    Component Value Date/Time   NA 139 06/20/2015 0737   K 3.3* 06/20/2015 0737   CL 103 06/20/2015 0737   CO2 32 06/20/2015 0737   BUN 19 06/20/2015 0737   CREATININE 0.45 06/20/2015 0737   GLUCOSE 87 06/20/2015 0737   CALCIUM 8.4* 06/20/2015 0737   AST 32 06/12/2015 1405   ALT 39 06/12/2015 1405   ALKPHOS 57 06/12/2015 1405   BILITOT 0.8 06/12/2015 1405   PROT 5.6* 06/12/2015 1405   ALBUMIN 2.5* 06/12/2015 1405  More than 50% of the visit was spent in counseling/coordination of care: YES  Time Spent: 35 min

## 2015-06-21 NOTE — Progress Notes (Signed)
Pt quiet today. Not eationg but just a few bites.  Dr Orvan Falconercampbell saw today.  Pt to go to hospice home. Clydie BraunKaren hopice liasion  Getting papers ready and calling report. Pt to go by ems. Clydie BraunKaren will call ems

## 2015-06-21 NOTE — Care Management Important Message (Signed)
Important Message  Patient Details  Name: Charese I Swazil[place]Jordan MRN: [id number] Date of Birth: [birth year]   Medicare Important Message Given:  Yes    Olegario MessierKathy A Omari Koslosky 06/21/2015, 11:23 AM

## 2016-02-11 ENCOUNTER — Emergency Department: Payer: Medicare Other

## 2016-02-11 ENCOUNTER — Inpatient Hospital Stay
Admission: EM | Admit: 2016-02-11 | Discharge: 2016-02-16 | DRG: 469 | Disposition: A | Discharge status: Skilled Nursing Facility | Payer: Medicare Other | Attending: Internal Medicine | Admitting: Internal Medicine

## 2016-02-11 ENCOUNTER — Encounter: Payer: Self-pay | Admitting: Emergency Medicine

## 2016-02-11 DIAGNOSIS — Z66 Do not resuscitate: Secondary | ICD-10-CM | POA: Diagnosis present

## 2016-02-11 DIAGNOSIS — F17210 Nicotine dependence, cigarettes, uncomplicated: Secondary | ICD-10-CM | POA: Diagnosis present

## 2016-02-11 DIAGNOSIS — J9601 Acute respiratory failure with hypoxia: Secondary | ICD-10-CM | POA: Diagnosis present

## 2016-02-11 DIAGNOSIS — I959 Hypotension, unspecified: Secondary | ICD-10-CM | POA: Diagnosis not present

## 2016-02-11 DIAGNOSIS — I110 Hypertensive heart disease with heart failure: Secondary | ICD-10-CM | POA: Diagnosis present

## 2016-02-11 DIAGNOSIS — I452 Bifascicular block: Secondary | ICD-10-CM | POA: Diagnosis present

## 2016-02-11 DIAGNOSIS — S72009A Fracture of unspecified part of neck of unspecified femur, initial encounter for closed fracture: Secondary | ICD-10-CM | POA: Diagnosis present

## 2016-02-11 DIAGNOSIS — W1830XA Fall on same level, unspecified, initial encounter: Secondary | ICD-10-CM | POA: Diagnosis present

## 2016-02-11 DIAGNOSIS — D649 Anemia, unspecified: Secondary | ICD-10-CM | POA: Diagnosis not present

## 2016-02-11 DIAGNOSIS — R011 Cardiac murmur, unspecified: Secondary | ICD-10-CM | POA: Diagnosis present

## 2016-02-11 DIAGNOSIS — R1310 Dysphagia, unspecified: Secondary | ICD-10-CM | POA: Diagnosis present

## 2016-02-11 DIAGNOSIS — F0390 Unspecified dementia without behavioral disturbance: Secondary | ICD-10-CM | POA: Diagnosis present

## 2016-02-11 DIAGNOSIS — R0602 Shortness of breath: Secondary | ICD-10-CM

## 2016-02-11 DIAGNOSIS — J690 Pneumonitis due to inhalation of food and vomit: Secondary | ICD-10-CM | POA: Diagnosis not present

## 2016-02-11 DIAGNOSIS — R262 Difficulty in walking, not elsewhere classified: Secondary | ICD-10-CM

## 2016-02-11 DIAGNOSIS — R5082 Postprocedural fever: Secondary | ICD-10-CM | POA: Diagnosis not present

## 2016-02-11 DIAGNOSIS — J440 Chronic obstructive pulmonary disease with acute lower respiratory infection: Secondary | ICD-10-CM | POA: Diagnosis present

## 2016-02-11 DIAGNOSIS — S72012A Unspecified intracapsular fracture of left femur, initial encounter for closed fracture: Secondary | ICD-10-CM | POA: Diagnosis present

## 2016-02-11 DIAGNOSIS — Z79899 Other long term (current) drug therapy: Secondary | ICD-10-CM | POA: Diagnosis not present

## 2016-02-11 DIAGNOSIS — E876 Hypokalemia: Secondary | ICD-10-CM | POA: Diagnosis not present

## 2016-02-11 DIAGNOSIS — M6281 Muscle weakness (generalized): Secondary | ICD-10-CM

## 2016-02-11 DIAGNOSIS — S72002A Fracture of unspecified part of neck of left femur, initial encounter for closed fracture: Secondary | ICD-10-CM | POA: Diagnosis present

## 2016-02-11 DIAGNOSIS — W19XXXA Unspecified fall, initial encounter: Secondary | ICD-10-CM

## 2016-02-11 DIAGNOSIS — I509 Heart failure, unspecified: Secondary | ICD-10-CM | POA: Diagnosis present

## 2016-02-11 DIAGNOSIS — J449 Chronic obstructive pulmonary disease, unspecified: Secondary | ICD-10-CM | POA: Diagnosis present

## 2016-02-11 DIAGNOSIS — M25552 Pain in left hip: Secondary | ICD-10-CM

## 2016-02-11 DIAGNOSIS — I10 Essential (primary) hypertension: Secondary | ICD-10-CM | POA: Diagnosis present

## 2016-02-11 LAB — CBC WITH DIFFERENTIAL/PLATELET
BASOS PCT: 0 %
Basophils Absolute: 0.1 10*3/uL (ref 0–0.1)
EOS ABS: 0.4 10*3/uL (ref 0–0.7)
Eosinophils Relative: 3 %
HCT: 40.5 % (ref 35.0–47.0)
HEMOGLOBIN: 13.4 g/dL (ref 12.0–16.0)
Lymphocytes Relative: 16 %
Lymphs Abs: 2.4 10*3/uL (ref 1.0–3.6)
MCH: 27 pg (ref 26.0–34.0)
MCHC: 33.1 g/dL (ref 32.0–36.0)
MCV: 81.7 fL (ref 80.0–100.0)
MONOS PCT: 7 %
Monocytes Absolute: 1.1 10*3/uL — ABNORMAL HIGH (ref 0.2–0.9)
NEUTROS PCT: 74 %
Neutro Abs: 10.5 10*3/uL — ABNORMAL HIGH (ref 1.4–6.5)
PLATELETS: 383 10*3/uL (ref 150–440)
RBC: 4.96 MIL/uL (ref 3.80–5.20)
RDW: 15.2 % — AB (ref 11.5–14.5)
WBC: 14.4 10*3/uL — ABNORMAL HIGH (ref 3.6–11.0)

## 2016-02-11 LAB — URINALYSIS COMPLETE WITH MICROSCOPIC (ARMC ONLY)
BILIRUBIN URINE: NEGATIVE
GLUCOSE, UA: NEGATIVE mg/dL
Hgb urine dipstick: NEGATIVE
Ketones, ur: NEGATIVE mg/dL
Leukocytes, UA: NEGATIVE
Nitrite: NEGATIVE
Protein, ur: NEGATIVE mg/dL
RBC / HPF: NONE SEEN RBC/hpf (ref 0–5)
SPECIFIC GRAVITY, URINE: 1.008 (ref 1.005–1.030)
pH: 7 (ref 5.0–8.0)

## 2016-02-11 LAB — COMPREHENSIVE METABOLIC PANEL
ALK PHOS: 95 U/L (ref 38–126)
ALT: 12 U/L — AB (ref 14–54)
AST: 18 U/L (ref 15–41)
Albumin: 3.6 g/dL (ref 3.5–5.0)
Anion gap: 6 (ref 5–15)
BILIRUBIN TOTAL: 0.4 mg/dL (ref 0.3–1.2)
BUN: 22 mg/dL — ABNORMAL HIGH (ref 6–20)
CO2: 32 mmol/L (ref 22–32)
CREATININE: 0.91 mg/dL (ref 0.44–1.00)
Calcium: 9 mg/dL (ref 8.9–10.3)
Chloride: 101 mmol/L (ref 101–111)
GFR, EST NON AFRICAN AMERICAN: 58 mL/min — AB (ref >60)
Glucose, Bld: 95 mg/dL (ref 65–99)
Potassium: 3.6 mmol/L (ref 3.5–5.1)
Sodium: 139 mmol/L (ref 135–145)
TOTAL PROTEIN: 7.6 g/dL (ref 6.5–8.1)

## 2016-02-11 LAB — PROTIME-INR
INR: 1.07
PROTHROMBIN TIME: 13.9 s (ref 11.4–15.2)

## 2016-02-11 LAB — TYPE AND SCREEN
ABO/RH(D): A POS
Antibody Screen: NEGATIVE

## 2016-02-11 LAB — APTT: aPTT: 32 seconds (ref 24–36)

## 2016-02-11 MED ORDER — ACETAMINOPHEN 650 MG RE SUPP: 650.0000 mg | Freq: Four times a day (QID) | RECTAL | Status: DC | PRN

## 2016-02-11 MED ORDER — MORPHINE SULFATE (PF) 4 MG/ML IV SOLN: 2.0000 mg | INTRAVENOUS | Status: DC | PRN

## 2016-02-11 MED ORDER — LORAZEPAM 0.5 MG PO TABS: 0.5000 mg | ORAL_TABLET | ORAL | Status: DC | PRN

## 2016-02-11 MED ORDER — SODIUM CHLORIDE 0.9 % IV SOLN
INTRAVENOUS | Status: DC
  Administered 2016-02-11: via INTRAVENOUS

## 2016-02-11 MED ORDER — ONDANSETRON HCL 4 MG/2ML IJ SOLN: 4.0000 mg | Freq: Four times a day (QID) | INTRAMUSCULAR | Status: DC | PRN

## 2016-02-11 MED ORDER — ACETAMINOPHEN 325 MG PO TABS
650.0000 mg | ORAL_TABLET | Freq: Four times a day (QID) | ORAL | Status: DC | PRN
Start: 2016-02-11 — End: 2016-02-12

## 2016-02-11 MED ORDER — MORPHINE SULFATE (PF) 2 MG/ML IV SOLN: 2.0000 mg | INTRAVENOUS | Status: DC | PRN

## 2016-02-11 MED ORDER — OXYCODONE HCL 5 MG PO TABS: 5.0000 mg | ORAL_TABLET | ORAL | Status: DC | PRN

## 2016-02-11 MED ORDER — MORPHINE SULFATE (CONCENTRATE) 10 MG/0.5ML PO SOLN
5.0000 mg | Freq: Four times a day (QID) | ORAL | Status: DC
  Administered 2016-02-12: 5 mg via ORAL
  Filled 2016-02-11: qty 1

## 2016-02-11 MED ORDER — SENNOSIDES-DOCUSATE SODIUM 8.6-50 MG PO TABS: 1.0000 | ORAL_TABLET | Freq: Two times a day (BID) | ORAL | Status: DC

## 2016-02-11 MED ORDER — ONDANSETRON HCL 4 MG PO TABS: 4.0000 mg | ORAL_TABLET | Freq: Four times a day (QID) | ORAL | Status: DC | PRN

## 2016-02-11 MED ORDER — IPRATROPIUM-ALBUTEROL 0.5-2.5 (3) MG/3ML IN SOLN: 3.0000 mL | RESPIRATORY_TRACT | Status: DC | PRN

## 2016-02-11 MED ORDER — CEFAZOLIN SODIUM-DEXTROSE 2-4 GM/100ML-% IV SOLN
2.0000 g | INTRAVENOUS | Status: AC
  Administered 2016-02-12: 2 g via INTRAVENOUS
  Filled 2016-02-11: qty 100

## 2016-02-11 MED ORDER — METHOCARBAMOL 1000 MG/10ML IJ SOLN: 500.0000 mg | Freq: Four times a day (QID) | INTRAVENOUS | Status: DC | PRN

## 2016-02-11 MED ORDER — METHOCARBAMOL 500 MG PO TABS: 500.0000 mg | ORAL_TABLET | Freq: Four times a day (QID) | ORAL | Status: DC | PRN

## 2016-02-11 NOTE — ED Triage Notes (Signed)
[facility] via EMS [facility] for report of fall from standing position and left hip pain with left leg shortening and rotation. EMS reports 130/96, 78 HR, 93% RA.

## 2016-02-11 NOTE — ED Notes (Signed)
Pt placed on 2L O2 via [place]. EMS reported pt uses O2 PRN at home.

## 2016-02-11 NOTE — ED Provider Notes (Signed)
[facility] Emergency Department Provider Note   ____________________________________________    I have reviewed the triage vital signs and the nursing notes.   HISTORY  Chief Complaint Fall and Hip Pain  Patient with a history of dementia coming from nursing home   HPI Leah [name] is a 82 y.o. female who presents after a mechanical fall onto her left hip.She complains of severe sharp pain when she tries to move her left leg. She denies head injury. She denies other injuries. She has not been given anything for the pain.  History limited by dementia   Past Medical History:  Diagnosis Date  . COPD (chronic obstructive pulmonary disease) (HCC)   . Dementia   . Essential hypertension     Patient Active Problem List   Diagnosis Date Noted  . Pressure ulcer 06/21/2015  . Acute on chronic respiratory failure with hypoxia and hypercapnia (HCC)   . Acute respiratory failure (HCC)   . Pneumonia 06/08/2015  . Sepsis (HCC) 06/07/2015    No past surgical history on file.  Prior to Admission medications   Medication Sig Start Date End Date Taking? Authorizing Provider  acetaminophen (TYLENOL) 325 MG tablet Take 2 tablets (650 mg total) by mouth every 4 (four) hours as needed for mild pain or fever. 06/20/15   Suan HalterMargaret F Campbell, MD  bisacodyl (DULCOLAX) 10 MG suppository Place 1 suppository (10 mg total) rectally daily as needed for moderate constipation. 06/20/15   Suan HalterMargaret F Campbell, MD  ipratropium-albuterol (DUONEB) 0.5-2.5 (3) MG/3ML SOLN Take 3 mLs by nebulization every 2 (two) hours as needed. 06/20/15   Suan HalterMargaret F Campbell, MD  LORazepam (ATIVAN) 0.5 MG tablet Take 1 tablet (0.5 mg total) by mouth every 4 (four) hours as needed for anxiety. 06/20/15   Suan HalterMargaret F Campbell, MD  Morphine Sulfate (MORPHINE CONCENTRATE) 10 MG/0.5ML SOLN concentrated solution Take 0.25 mLs (5 mg total) by mouth every hour as needed for moderate pain, severe pain or  shortness of breath. 06/20/15   Suan HalterMargaret F Campbell, MD  Morphine Sulfate (MORPHINE CONCENTRATE) 10 MG/0.5ML SOLN concentrated solution Take 0.25 mLs (5 mg total) by mouth every 6 (six) hours. 06/20/15   Suan HalterMargaret F Campbell, MD  prochlorperazine (COMPAZINE) 25 MG suppository Place 1 suppository (25 mg total) rectally every 8 (eight) hours as needed for nausea or vomiting. 06/20/15   Suan HalterMargaret F Campbell, MD  senna-docusate (SENOKOT-S) 8.6-50 MG tablet Take 1 tablet by mouth 2 (two) times daily. 06/20/15   Suan HalterMargaret F Campbell, MD     Allergies Patient has no known allergies.  No family history on file.  Social History Social History  Substance Use Topics  . Smoking status: Current Every Day Smoker    Packs/day: 1.00    Years: 60.00    Types: Cigarettes  . Smokeless tobacco: Never Used  . Alcohol use No    Level V caveat: Review of Systems Limited by dementia   Eyes: No visual changes.  ENT: No neck pain Cardiovascular: Denies chest pain. Respiratory: Denies shortness of breath. Gastrointestinal: No abdominal pain.   no vomiting.    Musculoskeletal: Denies back pain , left leg pain as above Skin: No laceration Neurological: Denies headache    ____________________________________________   PHYSICAL EXAM:  VITAL SIGNS: ED Triage Vitals  Enc Vitals Group     BP 02/11/16 1810 (!) 175/100     Pulse Rate 02/11/16 1810 70     Resp 02/11/16 1810 18     Temp  02/11/16 1810 98.6 F (37 C)     Temp Source 02/11/16 1810 Oral     SpO2 02/11/16 1810 (!) 89 %     Weight 02/11/16 1811 125 lb 8 oz (56.9 kg)     Height 02/11/16 1811 5\' 4"  (1.626 m)     Head Circumference --      Peak Flow --      Pain Score --      Pain Loc --      Pain Edu? --      Excl. in GC? --     Constitutional: Alert. No acute distress.  Eyes: Conjunctivae are normal.  Head: Atraumatic. Nose: No congestion/rhinnorhea. Mouth/Throat: Mucous membranes are moist.   Neck:  Painless ROM, No vertebral  tenderness to palpation Cardiovascular: Normal rate, regular rhythm. Grossly normal heart sounds.  Good peripheral circulation. Respiratory: Normal respiratory effort.  No retractions. Lungs CTAB. Gastrointestinal: Soft and nontender. No distention.  No CVA tenderness. Genitourinary: deferred Musculoskeletal: Left leg is shortened and externally rotated, patient is unable to extend at the hip without severe pain. When she is not moving her leg she has no pain .Warm and well perfused bilaterally Neurologic:  Normal speech and language. No gross focal neurologic deficits are appreciated.  Skin:  Skin is warm, dry and intact. No rash noted. Psychiatric: Mood and affect are normal. Speech and behavior are normal.  ____________________________________________   LABS (all labs ordered are listed, but only abnormal results are displayed)  Labs Reviewed  CBC WITH DIFFERENTIAL/PLATELET - Abnormal; Notable for the following:       Result Value   WBC 14.4 (*)    RDW 15.2 (*)    Neutro Abs 10.5 (*)    Monocytes Absolute 1.1 (*)    All other components within normal limits  PROTIME-INR  COMPREHENSIVE METABOLIC PANEL  URINALYSIS COMPLETEWITH MICROSCOPIC ([facility] ONLY)  APTT  TYPE AND SCREEN   ____________________________________________  EKG  ED ECG REPORT I, Jene EveryKINNER, Brittnae Aschenbrenner, the attending physician, personally viewed and interpreted this ECG.  Date: 02/11/2016 EKG Time: 6:09 PM Rate: 75 Rhythm: normal sinus rhythm QRS Axis: normal Intervals: normal ST/T Wave abnormalities: normal Conduction Disturbances: Right bundle branch block and left posterior fascicular block   ____________________________________________  RADIOLOGY  Left subcapital hip fracture ____________________________________________   PROCEDURES  Procedure(s) performed: No    Critical Care performed: No ____________________________________________   INITIAL IMPRESSION / ASSESSMENT AND PLAN / ED  COURSE  Pertinent labs & imaging results that were available during my care of the patient were reviewed by me and considered in my medical decision making (see chart for details).  Patient presents after a fall, her left leg is externally rotated and shortened concerning for hip fracture  Clinical Course   X-ray demonstrates left subcapital hip fracture, discussed with Dr. Martha ClanKrasinski, plan is for surgery tomorrow, will admit to the hospitalist service ____________________________________________   FINAL CLINICAL IMPRESSION(S) / ED DIAGNOSES  Final diagnoses:  Closed subcapital fracture of left femur, initial encounter (HCC)      NEW MEDICATIONS STARTED DURING THIS VISIT:  New Prescriptions   No medications on file     Note:  This document was prepared using Dragon voice recognition software and may include unintentional dictation errors.    Jene Everyobert Chantavia Bazzle, MD 02/11/16 [phone]

## 2016-02-11 NOTE — ED Notes (Signed)
Patient transported to X-ray 

## 2016-02-11 NOTE — H&P (Signed)
[facility] [place] [facility]   PATIENT NAME: Leah [name]    MR#:  [id number]  DATE OF BIRTH:  [birth year]  DATE OF ADMISSION:  02/11/2016  PRIMARY CARE PHYSICIAN: Jaclyn ShaggyATE,DENNY C, MD   REQUESTING/REFERRING PHYSICIAN: Cyril LoosenKinner, M.D.  CHIEF COMPLAINT:   Chief Complaint  Patient presents with  . Fall  . Hip Pain    HISTORY OF PRESENT ILLNESS:  Leah [name]  is a 82 y.o. female who presents with Mechanical fall and subsequent hip fracture. Patient is demented and is unable to contribute much to her history of present illness, but family is in the room at bedside and provides information. Patient is a resident at nursing facility and reportedly fell from a standing position. Hospitalists were called for admission.  PAST MEDICAL HISTORY:   Past Medical History:  Diagnosis Date  . COPD (chronic obstructive pulmonary disease) (HCC)   . Dementia   . Essential hypertension     PAST SURGICAL HISTORY:   Past Surgical History:  Procedure Laterality Date  . NO PAST SURGERIES      SOCIAL HISTORY:   Social History  Substance Use Topics  . Smoking status: Current Every Day Smoker    Packs/day: 1.00    Years: 60.00    Types: Cigarettes  . Smokeless tobacco: Never Used  . Alcohol use No    FAMILY HISTORY:   Family History  Problem Relation Age of Onset  . Family history unknown: Yes    DRUG ALLERGIES:  No Known Allergies  MEDICATIONS AT HOME:   Prior to Admission medications   Medication Sig Start Date End Date Taking? Authorizing Provider  acetaminophen (TYLENOL) 325 MG tablet Take 2 tablets (650 mg total) by mouth every 4 (four) hours as needed for mild pain or fever. 06/20/15   Suan HalterMargaret F Campbell, MD  bisacodyl (DULCOLAX) 10 MG suppository Place 1 suppository (10 mg total) rectally daily as needed for moderate constipation. 06/20/15   Suan HalterMargaret F Campbell, MD  ipratropium-albuterol (DUONEB) 0.5-2.5 (3) MG/3ML SOLN Take 3 mLs by  nebulization every 2 (two) hours as needed. 06/20/15   Suan HalterMargaret F Campbell, MD  LORazepam (ATIVAN) 0.5 MG tablet Take 1 tablet (0.5 mg total) by mouth every 4 (four) hours as needed for anxiety. 06/20/15   Suan HalterMargaret F Campbell, MD  Morphine Sulfate (MORPHINE CONCENTRATE) 10 MG/0.5ML SOLN concentrated solution Take 0.25 mLs (5 mg total) by mouth every hour as needed for moderate pain, severe pain or shortness of breath. 06/20/15   Suan HalterMargaret F Campbell, MD  Morphine Sulfate (MORPHINE CONCENTRATE) 10 MG/0.5ML SOLN concentrated solution Take 0.25 mLs (5 mg total) by mouth every 6 (six) hours. 06/20/15   Suan HalterMargaret F Campbell, MD  prochlorperazine (COMPAZINE) 25 MG suppository Place 1 suppository (25 mg total) rectally every 8 (eight) hours as needed for nausea or vomiting. 06/20/15   Suan HalterMargaret F Campbell, MD  senna-docusate (SENOKOT-S) 8.6-50 MG tablet Take 1 tablet by mouth 2 (two) times daily. 06/20/15   Suan HalterMargaret F Campbell, MD    REVIEW OF SYSTEMS:  Review of Systems  Unable to perform ROS: Dementia     VITAL SIGNS:   Vitals:   02/11/16 1811 02/11/16 1814 02/11/16 2000 02/11/16 2100  BP:   (!) 171/69 (!) 154/93  Pulse:    70  Resp:   19 (!) 23  Temp:      TempSrc:      SpO2:  91%  91%  Weight: 56.9 kg (125 lb 8 oz)  Height: 5\' 4"  (1.626 m)      Wt Readings from Last 3 Encounters:  02/11/16 56.9 kg (125 lb 8 oz)  06/18/15 56.7 kg (125 lb)    PHYSICAL EXAMINATION:  Physical Exam  Vitals reviewed. Constitutional: She appears well-developed and well-nourished. No distress.  HENT:  Head: Normocephalic and atraumatic.  Mouth/Throat: Oropharynx is clear and moist.  Eyes: Conjunctivae and EOM are normal. Pupils are equal, round, and reactive to light. No scleral icterus.  Neck: Normal range of motion. Neck supple. No JVD present. No thyromegaly present.  Cardiovascular: Normal rate, regular rhythm and intact distal pulses.  Exam reveals no gallop and no friction rub.   No murmur  heard. Respiratory: Effort normal and breath sounds normal. No respiratory distress. She has no wheezes. She has no rales.  GI: Soft. Bowel sounds are normal. She exhibits no distension. There is no tenderness.  Musculoskeletal: Normal range of motion. She exhibits tenderness (left hip). She exhibits no edema.  No arthritis, no gout  Lymphadenopathy:    She has no cervical adenopathy.  Neurological: She is alert. No cranial nerve deficit.  Unable to fully assess due to patient's dementia. No dysarthria, no aphasia  Skin: Skin is warm and dry. No rash noted. No erythema.  Psychiatric:  Unable to assess due to patient's dementia    LABORATORY PANEL:   CBC  Recent Labs Lab 02/11/16 1823  WBC 14.4*  HGB 13.4  HCT 40.5  PLT 383   ------------------------------------------------------------------------------------------------------------------  Chemistries   Recent Labs Lab 02/11/16 1823  NA 139  K 3.6  CL 101  CO2 32  GLUCOSE 95  BUN 22*  CREATININE 0.91  CALCIUM 9.0  AST 18  ALT 12*  ALKPHOS 95  BILITOT 0.4   ------------------------------------------------------------------------------------------------------------------  Cardiac Enzymes No results for input(s): TROPONINI in the last 168 hours. ------------------------------------------------------------------------------------------------------------------  RADIOLOGY:  Dg Chest 1 View  Result Date: 02/11/2016 CLINICAL DATA:  Fall with left hip fracture, preop. EXAM: CHEST 1 VIEW COMPARISON:  Most recent radiograph 06/17/2015. Most recent CT 06/07/2015 FINDINGS: Improved lung base aeration from prior radiographs, with residual or recurrent streaky opacity at the right lung base. Mild hyperinflation and emphysema. Left upper lobe nodule tentatively identified. Normal heart size and mediastinal contours. There is atherosclerosis of the aortic arch. No pulmonary edema, large pleural effusion or pneumothorax. Skin fold  projects over the left lung base. Remote right rib fractures. IMPRESSION: 1. Streaky right lung base opacity may be chronic scarring, however recurrent atelectasis, aspiration, or pneumonia not entirely excluded. 2. Emphysema.  Thoracic aortic atherosclerosis. 3. Left upper lobe nodule tentatively identified, as seen on prior chest CT. Recommend correlation with any interval imaging/intervention performed elsewhere. In the absence of interval imaging, recommend follow-up chest CT on a nonemergent basis for nodule re-evaluation. Electronically Signed   By: Rubye Oaks M.D.   On: 02/11/2016 18:59   Dg Hip Unilat With Pelvis 2-3 Views Left  Result Date: 02/11/2016 CLINICAL DATA:  Left hip pain after fall from standing position. Left leg shortening and rotation. EXAM: DG HIP (WITH OR WITHOUT PELVIS) 2-3V LEFT COMPARISON:  None. FINDINGS: Impacted fracture of the left subcapital femoral neck. No additional fracture of the bony pelvis. Pubic symphysis and sacroiliac joints are congruent. The bones appear under mineralized. IMPRESSION: Impacted left subcapital femoral neck fracture. Electronically Signed   By: Rubye Oaks M.D.   On: 02/11/2016 18:54    EKG:   Orders placed or performed during the hospital encounter of 02/11/16  .  EKG 12-Lead  . EKG 12-Lead    IMPRESSION AND PLAN:  Principal Problem:   Closed left hip fracture [facility](HCC) - consult orthopedic surgery for likely surgical repair. Cardiac risk stratification as below, patient has no major cardiac risk factors, see chart for risk stratification   Active Problems:   HTN (hypertension) - continue home meds   Dementia - not on any medications for this, though it does limit her ability to contribute to history and exam   COPD (chronic obstructive pulmonary disease) (HCC) - continue home inhalers/nebs  All the records are reviewed and case discussed with ED provider. Management plans discussed with the patient and/or family.  DVT  PROPHYLAXIS: Mechanical only  GI PROPHYLAXIS: None  ADMISSION STATUS: Inpatient  CODE STATUS: Full Code Status History    Date Active Date Inactive Code Status Order ID Comments User Context   02/11/2016 10:06 PM 02/11/2016 10:07 PM Full Code [id number]  Juanell FairlyKevin Krasinski, MD ED   06/20/2015  4:53 PM 06/21/2015  8:10 PM DNR [id number]  Suan HalterMargaret F Campbell, MD Inpatient   06/19/2015  5:29 PM 06/20/2015  4:53 PM DNR [id number]  Suan HalterMargaret F Campbell, MD Inpatient   06/12/2015 10:26 AM 06/19/2015  5:29 PM DNR [id number]  Erin FullingKurian Kasa, MD Inpatient   06/08/2015  2:51 AM 06/12/2015 10:26 AM Full Code [id number]  Houston SirenVivek J Sainani, MD ED      TOTAL TIME TAKING CARE OF THIS PATIENT: 45 minutes.    Jonice Cerra FIELDING 02/11/2016, 10:32 PM  [facility]  Office  [phone]  CC: Primary care physician; Jaclyn ShaggyATE,DENNY C, MD

## 2016-02-12 ENCOUNTER — Encounter: Payer: Self-pay | Admitting: *Deleted

## 2016-02-12 ENCOUNTER — Inpatient Hospital Stay: Payer: Medicare Other | Admitting: Registered Nurse

## 2016-02-12 ENCOUNTER — Encounter
Admission: EM | Disposition: A | Discharge status: Skilled Nursing Facility | Payer: Self-pay | Source: Home / Self Care | Attending: Internal Medicine

## 2016-02-12 ENCOUNTER — Inpatient Hospital Stay: Payer: Medicare Other

## 2016-02-12 HISTORY — PX: HIP ARTHROPLASTY: SHX981

## 2016-02-12 LAB — COMPREHENSIVE METABOLIC PANEL
ALBUMIN: 3.6 g/dL (ref 3.5–5.0)
ALT: 13 U/L — AB (ref 14–54)
AST: 18 U/L (ref 15–41)
Alkaline Phosphatase: 94 U/L (ref 38–126)
Anion gap: 7 (ref 5–15)
BILIRUBIN TOTAL: 0.9 mg/dL (ref 0.3–1.2)
BUN: 19 mg/dL (ref 6–20)
CO2: 28 mmol/L (ref 22–32)
CREATININE: 0.74 mg/dL (ref 0.44–1.00)
Calcium: 9.1 mg/dL (ref 8.9–10.3)
Chloride: 106 mmol/L (ref 101–111)
GFR calc Af Amer: >60 mL/min (ref >60)
GLUCOSE: 124 mg/dL — AB (ref 65–99)
Potassium: 3.5 mmol/L (ref 3.5–5.1)
Sodium: 141 mmol/L (ref 135–145)
TOTAL PROTEIN: 7.7 g/dL (ref 6.5–8.1)

## 2016-02-12 LAB — CBC
HEMATOCRIT: 38 % (ref 35.0–47.0)
Hemoglobin: 12.5 g/dL (ref 12.0–16.0)
MCH: 26.9 pg (ref 26.0–34.0)
MCHC: 32.8 g/dL (ref 32.0–36.0)
MCV: 81.9 fL (ref 80.0–100.0)
Platelets: 365 10*3/uL (ref 150–440)
RBC: 4.64 MIL/uL (ref 3.80–5.20)
RDW: 15.1 % — AB (ref 11.5–14.5)
WBC: 17.6 10*3/uL — ABNORMAL HIGH (ref 3.6–11.0)

## 2016-02-12 LAB — CBC WITH DIFFERENTIAL/PLATELET
BASOS ABS: 0.1 10*3/uL (ref 0–0.1)
Basophils Relative: 1 %
Eosinophils Absolute: 0.1 10*3/uL (ref 0–0.7)
Eosinophils Relative: 1 %
HEMATOCRIT: 41.4 % (ref 35.0–47.0)
HEMOGLOBIN: 13.4 g/dL (ref 12.0–16.0)
LYMPHS PCT: 5 %
Lymphs Abs: 1.1 10*3/uL (ref 1.0–3.6)
MCH: 26.3 pg (ref 26.0–34.0)
MCHC: 32.4 g/dL (ref 32.0–36.0)
MCV: 81.1 fL (ref 80.0–100.0)
Monocytes Absolute: 1.4 10*3/uL — ABNORMAL HIGH (ref 0.2–0.9)
Monocytes Relative: 7 %
NEUTROS ABS: 18 10*3/uL — AB (ref 1.4–6.5)
NEUTROS PCT: 86 %
Platelets: 404 10*3/uL (ref 150–440)
RBC: 5.11 MIL/uL (ref 3.80–5.20)
RDW: 15.1 % — ABNORMAL HIGH (ref 11.5–14.5)
WBC: 20.7 10*3/uL — AB (ref 3.6–11.0)

## 2016-02-12 LAB — BASIC METABOLIC PANEL
Anion gap: 6 (ref 5–15)
BUN: 16 mg/dL (ref 6–20)
CALCIUM: 8.8 mg/dL — AB (ref 8.9–10.3)
CHLORIDE: 106 mmol/L (ref 101–111)
CO2: 28 mmol/L (ref 22–32)
CREATININE: 0.68 mg/dL (ref 0.44–1.00)
GFR calc non Af Amer: >60 mL/min (ref >60)
Glucose, Bld: 99 mg/dL (ref 65–99)
Potassium: 3.4 mmol/L — ABNORMAL LOW (ref 3.5–5.1)
SODIUM: 140 mmol/L (ref 135–145)

## 2016-02-12 LAB — SURGICAL PCR SCREEN
MRSA, PCR: NEGATIVE
STAPHYLOCOCCUS AUREUS: POSITIVE — AB

## 2016-02-12 LAB — APTT: APTT: 32 s (ref 24–36)

## 2016-02-12 LAB — PROTIME-INR
INR: 1.08
Prothrombin Time: 14 seconds (ref 11.4–15.2)

## 2016-02-12 SURGERY — HEMIARTHROPLASTY, HIP, DIRECT ANTERIOR APPROACH, FOR FRACTURE
Anesthesia: Spinal | Laterality: Left

## 2016-02-12 MED ORDER — BISACODYL 10 MG RE SUPP
10.0000 mg | Freq: Every day | RECTAL | Status: DC | PRN
  Administered 2016-02-14: 10 mg via RECTAL
  Filled 2016-02-12: qty 1

## 2016-02-12 MED ORDER — FERROUS SULFATE 325 (65 FE) MG PO TABS
325.0000 mg | ORAL_TABLET | Freq: Three times a day (TID) | ORAL | Status: DC
  Administered 2016-02-13 – 2016-02-16 (×10): 325 mg via ORAL
  Filled 2016-02-12 (×9): qty 1

## 2016-02-12 MED ORDER — ENOXAPARIN SODIUM 40 MG/0.4ML ~~LOC~~ SOLN
40.0000 mg | SUBCUTANEOUS | Status: DC
  Administered 2016-02-13 – 2016-02-16 (×4): 40 mg via SUBCUTANEOUS
  Filled 2016-02-12 (×4): qty 0.4

## 2016-02-12 MED ORDER — MORPHINE SULFATE (PF) 2 MG/ML IV SOLN: 2.0000 mg | INTRAVENOUS | Status: DC | PRN

## 2016-02-12 MED ORDER — MEPERIDINE HCL 25 MG/ML IJ SOLN
INTRAMUSCULAR | Status: AC
  Administered 2016-02-12: 12.5 mg via INTRAVENOUS
  Filled 2016-02-12: qty 1

## 2016-02-12 MED ORDER — ACETAMINOPHEN 325 MG PO TABS
650.0000 mg | ORAL_TABLET | Freq: Four times a day (QID) | ORAL | Status: DC | PRN
  Administered 2016-02-13 (×2): 650 mg via ORAL
  Filled 2016-02-12: qty 2

## 2016-02-12 MED ORDER — SODIUM CHLORIDE 0.9 % IJ SOLN
INTRAMUSCULAR | Status: AC
  Filled 2016-02-12: qty 10

## 2016-02-12 MED ORDER — LACTATED RINGERS IV SOLN
INTRAVENOUS | Status: DC
  Administered 2016-02-12: 10:00:00 via INTRAVENOUS

## 2016-02-12 MED ORDER — METHOCARBAMOL 500 MG PO TABS: 500.0000 mg | ORAL_TABLET | Freq: Four times a day (QID) | ORAL | Status: DC | PRN

## 2016-02-12 MED ORDER — ACETAMINOPHEN 10 MG/ML IV SOLN
INTRAVENOUS | Status: DC | PRN
  Administered 2016-02-12: 1000 mg via INTRAVENOUS

## 2016-02-12 MED ORDER — ALUM & MAG HYDROXIDE-SIMETH 200-200-20 MG/5ML PO SUSP: 30.0000 mL | ORAL | Status: DC | PRN

## 2016-02-12 MED ORDER — MEPERIDINE HCL 25 MG/ML IJ SOLN
6.2500 mg | INTRAMUSCULAR | Status: DC | PRN
  Administered 2016-02-12: 12.5 mg via INTRAVENOUS

## 2016-02-12 MED ORDER — METHOCARBAMOL 1000 MG/10ML IJ SOLN
500.0000 mg | Freq: Four times a day (QID) | INTRAVENOUS | Status: DC | PRN
  Filled 2016-02-12: qty 5

## 2016-02-12 MED ORDER — CEFAZOLIN SODIUM-DEXTROSE 2-4 GM/100ML-% IV SOLN
2.0000 g | Freq: Four times a day (QID) | INTRAVENOUS | Status: AC
  Administered 2016-02-12 (×2): 2 g via INTRAVENOUS
  Filled 2016-02-12 (×2): qty 100

## 2016-02-12 MED ORDER — BUPIVACAINE HCL (PF) 0.5 % IJ SOLN
INTRAMUSCULAR | Status: DC | PRN
  Administered 2016-02-12: 3 mL

## 2016-02-12 MED ORDER — ONDANSETRON HCL 4 MG/2ML IJ SOLN
4.0000 mg | Freq: Four times a day (QID) | INTRAMUSCULAR | Status: DC | PRN
  Administered 2016-02-12: 4 mg via INTRAVENOUS
  Filled 2016-02-12: qty 2

## 2016-02-12 MED ORDER — ACETAMINOPHEN 10 MG/ML IV SOLN
INTRAVENOUS | Status: AC
  Filled 2016-02-12: qty 100

## 2016-02-12 MED ORDER — PROPOFOL 10 MG/ML IV BOLUS
INTRAVENOUS | Status: DC | PRN
Start: 2016-02-12 — End: 2016-02-12
  Administered 2016-02-12 (×2): 10 mg via INTRAVENOUS

## 2016-02-12 MED ORDER — NEOMYCIN-POLYMYXIN B GU 40-200000 IR SOLN
Status: AC
  Filled 2016-02-12: qty 20

## 2016-02-12 MED ORDER — DOCUSATE SODIUM 100 MG PO CAPS
100.0000 mg | ORAL_CAPSULE | Freq: Two times a day (BID) | ORAL | Status: DC
  Administered 2016-02-13 – 2016-02-16 (×6): 100 mg via ORAL
  Filled 2016-02-12 (×7): qty 1

## 2016-02-12 MED ORDER — CEFAZOLIN SODIUM-DEXTROSE 2-4 GM/100ML-% IV SOLN
INTRAVENOUS | Status: AC
  Filled 2016-02-12: qty 100

## 2016-02-12 MED ORDER — MENTHOL 3 MG MT LOZG
1.0000 | LOZENGE | OROMUCOSAL | Status: DC | PRN
Start: 2016-02-12 — End: 2016-02-16
  Filled 2016-02-12: qty 9

## 2016-02-12 MED ORDER — MAGNESIUM CITRATE PO SOLN
1.0000 | Freq: Once | ORAL | Status: DC | PRN
  Filled 2016-02-12: qty 296

## 2016-02-12 MED ORDER — FENTANYL CITRATE (PF) 100 MCG/2ML IJ SOLN: 25.0000 ug | INTRAMUSCULAR | Status: DC | PRN

## 2016-02-12 MED ORDER — FENTANYL CITRATE (PF) 100 MCG/2ML IJ SOLN
INTRAMUSCULAR | Status: DC | PRN
  Administered 2016-02-12 (×3): 25 ug via INTRAVENOUS

## 2016-02-12 MED ORDER — PROPOFOL 500 MG/50ML IV EMUL
INTRAVENOUS | Status: DC | PRN
  Administered 2016-02-12: 10 ug/kg/min via INTRAVENOUS

## 2016-02-12 MED ORDER — PROMETHAZINE HCL 25 MG/ML IJ SOLN
INTRAMUSCULAR | Status: AC
  Administered 2016-02-12: 6.25 mg via INTRAVENOUS
  Filled 2016-02-12: qty 1

## 2016-02-12 MED ORDER — SODIUM CHLORIDE 0.9 % IV SOLN
75.0000 mL/h | INTRAVENOUS | Status: DC
  Administered 2016-02-12 – 2016-02-14 (×4): 75 mL/h via INTRAVENOUS

## 2016-02-12 MED ORDER — NEOMYCIN-POLYMYXIN B GU 40-200000 IR SOLN
Status: DC | PRN
  Administered 2016-02-12: 16 mL

## 2016-02-12 MED ORDER — ONDANSETRON HCL 4 MG PO TABS: 4.0000 mg | ORAL_TABLET | Freq: Four times a day (QID) | ORAL | Status: DC | PRN

## 2016-02-12 MED ORDER — PHENOL 1.4 % MT LIQD
1.0000 | OROMUCOSAL | Status: DC | PRN
  Filled 2016-02-12: qty 177

## 2016-02-12 MED ORDER — ACETAMINOPHEN 650 MG RE SUPP: 650.0000 mg | Freq: Four times a day (QID) | RECTAL | Status: DC | PRN

## 2016-02-12 MED ORDER — POLYETHYLENE GLYCOL 3350 17 G PO PACK: 17.0000 g | PACK | Freq: Every day | ORAL | Status: DC | PRN

## 2016-02-12 MED ORDER — SENNA 8.6 MG PO TABS
1.0000 | ORAL_TABLET | Freq: Two times a day (BID) | ORAL | Status: DC
  Administered 2016-02-13 – 2016-02-16 (×7): 8.6 mg via ORAL
  Filled 2016-02-12 (×7): qty 1

## 2016-02-12 MED ORDER — OXYCODONE HCL 5 MG PO TABS
5.0000 mg | ORAL_TABLET | ORAL | Status: DC | PRN
  Administered 2016-02-12: 10 mg via ORAL
  Filled 2016-02-12: qty 2
  Filled 2016-02-12: qty 1

## 2016-02-12 MED ORDER — PROMETHAZINE HCL 25 MG/ML IJ SOLN
6.2500 mg | INTRAMUSCULAR | Status: DC | PRN
  Administered 2016-02-12: 6.25 mg via INTRAVENOUS

## 2016-02-12 SURGICAL SUPPLY — 53 items
BIT DRILL 12.7X2STRG SHNK (BIT) ×1 IMPLANT
BIT DRILL 2.0 (BIT) ×2
BIT DRL 12.7X2STRG SHNK (BIT) ×1
BLADE SAGITTAL WIDE XTHICK NO (BLADE) ×3 IMPLANT
BLADE SURG SZ10 CARB STEEL (BLADE) ×3 IMPLANT
BNDG COHESIVE 4X5 TAN STRL (GAUZE/BANDAGES/DRESSINGS) ×3 IMPLANT
CANISTER SUCT 1200ML W/VALVE (MISCELLANEOUS) ×3 IMPLANT
CANISTER SUCT 3000ML PPV (MISCELLANEOUS) ×6 IMPLANT
CAPT HIP HEMI 2 ×3 IMPLANT
DRAPE IMP U-DRAPE 54X76 (DRAPES) ×3 IMPLANT
DRAPE INCISE IOBAN 66X60 STRL (DRAPES) ×3 IMPLANT
DRAPE SHEET LG 3/4 BI-LAMINATE (DRAPES) ×9 IMPLANT
DRAPE SURG 17X11 SM STRL (DRAPES) ×3 IMPLANT
DRAPE TABLE BACK 80X90 (DRAPES) ×3 IMPLANT
DRSG OPSITE POSTOP 4X10 (GAUZE/BANDAGES/DRESSINGS) ×3 IMPLANT
DURAPREP 26ML APPLICATOR (WOUND CARE) ×12 IMPLANT
ELECT BLADE 6.5 EXT (BLADE) ×3 IMPLANT
ELECT CAUTERY BLADE 6.4 (BLADE) ×3 IMPLANT
ELECT REM PT RETURN 9FT ADLT (ELECTROSURGICAL) ×3
ELECTRODE REM PT RTRN 9FT ADLT (ELECTROSURGICAL) ×1 IMPLANT
GAUZE PETRO XEROFOAM 1X8 (MISCELLANEOUS) ×6 IMPLANT
GAUZE SPONGE 4X4 12PLY STRL (GAUZE/BANDAGES/DRESSINGS) ×3 IMPLANT
GLOVE BIOGEL PI IND STRL 9 (GLOVE) ×1 IMPLANT
GLOVE BIOGEL PI INDICATOR 9 (GLOVE) ×2
GLOVE SURG 9.0 ORTHO LTXF (GLOVE) ×6 IMPLANT
GOWN STRL REUS TWL 2XL XL LVL4 (GOWN DISPOSABLE) ×3 IMPLANT
GOWN STRL REUS W/ TWL LRG LVL3 (GOWN DISPOSABLE) ×1 IMPLANT
GOWN STRL REUS W/TWL LRG LVL3 (GOWN DISPOSABLE) ×2
HANDPIECE INTERPULSE COAX TIP (DISPOSABLE) ×2
HEMOVAC 400ML (MISCELLANEOUS)
HIP CAPITATED HEMI 2 ×1 IMPLANT
KIT DRAIN HEMOVAC JP 7FR 400ML (MISCELLANEOUS) IMPLANT
KIT RM TURNOVER STRD PROC AR (KITS) ×3 IMPLANT
NDL SAFETY 18GX1.5 (NEEDLE) ×3 IMPLANT
NEEDLE FILTER BLUNT 18X 1/2SAF (NEEDLE) ×2
NEEDLE FILTER BLUNT 18X1 1/2 (NEEDLE) ×1 IMPLANT
NEEDLE MAYO CATGUT SZ4 (NEEDLE) ×3 IMPLANT
NS IRRIG 1000ML POUR BTL (IV SOLUTION) ×3 IMPLANT
PACK HIP PROSTHESIS (MISCELLANEOUS) ×3 IMPLANT
PILLOW ABDUC SM (MISCELLANEOUS) ×3 IMPLANT
RETRIEVER SUT HEWSON (MISCELLANEOUS) ×3 IMPLANT
SET HNDPC FAN SPRY TIP SCT (DISPOSABLE) ×1 IMPLANT
SOL .9 NS 3000ML IRR  AL (IV SOLUTION) ×2
SOL .9 NS 3000ML IRR UROMATIC (IV SOLUTION) ×1 IMPLANT
STAPLER SKIN PROX 35W (STAPLE) ×3 IMPLANT
SUT MNCRL 3 0 RB1 (SUTURE) ×1 IMPLANT
SUT MONOCRYL 3 0 RB1 (SUTURE) ×2
SUT TICRON 2-0 30IN 311381 (SUTURE) ×15 IMPLANT
SUT VIC AB 0 CT1 36 (SUTURE) ×3 IMPLANT
SUT VIC AB 2-0 CT2 27 (SUTURE) ×9 IMPLANT
SYRINGE 10CC LL (SYRINGE) ×3 IMPLANT
TAPE MICROFOAM 4IN (TAPE) ×3 IMPLANT
TAPE TRANSPORE STRL 2 31045 (GAUZE/BANDAGES/DRESSINGS) ×3 IMPLANT

## 2016-02-12 NOTE — Op Note (Signed)
02/11/2016 - 02/12/2016  1:09 PM  PATIENT:  Leah [name]   MRN: [id number]  PRE-OPERATIVE DIAGNOSIS:  left femoral neck hip fracture  POST-OPERATIVE DIAGNOSIS:  left femoral neck hip fracture  PROCEDURE:  Procedure(s): LEFT HIP HEMIARTHROPLASTY  PREOPERATIVE INDICATIONS:    Leah [name] is an 82 y.o. female who was admitted with a diagnosis of left hip fracture.  I have recommended surgical fixation with hemiarthroplasty for this injury. I have explained the surgery and the postoperative course to the patient and her son who agreed with surgical management of this fracture.    The risks benefits and alternatives were discussed with the patient and their family including but not limited to the risks of  infection requiring removal of the prosthesis, bleeding requiring blood transfusion, nerve injury especially to the sciatic nerve leading to foot drop or lower extremity numbness, periprosthetic fracture, dislocation leg length discrepancy, change in lower extremity rotation persistent hip pain, loosening or failure of the components and the need for revision surgery. Medical risks include but are not limited to DVT and pulmonary embolism, myocardial infarction, stroke, pneumonia, respiratory failure and death.  OPERATIVE REPORT     SURGEON:  Thornton Park, MD    ANESTHESIA:  Spinal    COMPLICATIONS:  None.   SPECIMEN: Femoral head to pathology    COMPONENTS:  [employer] Accolade TMZF femoral component size 4.5 with a 49 mm +0 neck adjustment sleeve.    PROCEDURE IN DETAIL:   The patient was met in the holding area and  identified.  The appropriate hip was identified and marked at the operative site after verbally confirming with the patient that this was the correct site of surgery.  The patient was then transported to the OR  and  underwent spinal anesthesia.  The patient was then placed in the lateral decubitus position with the operative side up and secured on the operating  room table with a pegboard and all bony prominences were adequately padded. This included an axillary roll and additional padding around the nonoperative leg to prevent compression to the common peroneal nerve.    The operative lower extremity was prepped and draped in a sterile fashion.  A time out was performed prior to incision to verify patient's name, date of birth, medical record number, correct site of surgery correct procedure to be performed. The timeout was also used to verify the patient received antibiotics now appropriate instruments, implants and radiographic studies were available in the room. Once all in attendance were in agreement case began.    A posterolateral approach was utilized via sharp dissection  carried down to the subcutaneous tissue.  Bleeding vessels were coagulated using electrocautery.  The fascia lata was identified and incised along the length of the skin incision.  The gluteus maximus muscle was then split in line with its fibers. Self-retaining retractors were  inserted.  With the hip internally rotated, the short external rotators  were identified and removed from the posterior attachment from the greater trochanter. The piriformis was tagged for later repair. The capsule was identified and a T-shaped capsulotomy was performed. The capsule was tagged with #2 Tycron for later repair.  The femoral neck fracture was exposed, and the femoral head was removed using a corkscrew device. This was measured to be 49 mm in diameter. The attention was then turned to proximal femur preparation.  An oscillating saw was used to perform a proximal femoral osteotomy 1 fingerbreadth above the lesser trochanter. The trial 49 mm  femoral head was placed into the acetabulum and had an excellent suction fit. The attention was then turned back to femoral preparation.    A femoral skid and Cobra retractor were placed under the femoral neck to allow for adequate visualization. A box osteotome was  used to make the initial entry into the proximal femur. A single hand reamer was used to prepare the femoral canal. A T-shaped femoral canal sounder was then used to ensure no penetration femoral cortex had occurred during reaming. The proximal femur was then sequentially broached by hand. A size 4.5 femoral trial was found to have best medial to lateral canal fit. Once adequate mediolateral canal fill was achieved the trial femoral broach, neck, and head was assembled and the hip was reduced. It was found to have excellent stability, equivalent leg lengths with functional range of motion. The trial components were then removed.  I copiously irrigated the femoral canal and then impacted the real Accolade TMZF 4.5 femoral prosthesis into place into the appropriate version, slightly anteverted to the normal anatomy, and I impacted the actual 49 mm Unitrax femoral head component with a +0 neck adjustment sleeve into place. The hip was then reduced and taken through functional range of motion and found to have excellent stability. Leg lengths were restored. The hip joint was copiously irrigated.   A soft tissue repair of the capsule and external rotators was performed using #2 Tycron Excellent posterior capsular repair was achieved. The fascia lata was then closed with interrupted 0 Vicryl suture. The subcutaneous tissues were closed with 2-0 Vicryl and the skin approximated with staples.   The patient was then placed supine on the operative table. Leg lengths were checked clinically and found to be equivalent. An abduction pillow was placed between the lower extremities. The patient was then transferred to a hospital bed and brought to the PACU in stable condition. I was scrubbed and present the entire case and all sharp and instrument counts were correct at the conclusion of the case. I spoke with the patient's son by phone to let him know the case was performed without complication patient was stable in  recovery room.   Timoteo Gaul, MD Orthopedic Surgeon

## 2016-02-12 NOTE — Progress Notes (Signed)
Post op- patient back from PACU. Feeling sensation in her feet. Able to move feet against gravity. Patient has honeycomb dressing on left. No drainage at this time. Ice applied. Patient denies pain at this time. Patient alert to self (baseline).   Harvie HeckMelanie Jocelynne Duquette, RN

## 2016-02-12 NOTE — Progress Notes (Signed)
[employer] - [place] at [facility]                                                                                                                                                                                  Patient Demographics   Leah [name], is a 82 y.o. female, DOB - November 01, 1932, EAV:[id number]  Admit date - 02/11/2016   Admitting Physician Oralia Manisavid Willis, MD  Outpatient Primary MD for the patient is TATE,DENNY C, MD   LOS - 1  Subjective: Patient seen in the PACU denies any chest pain or shortness of breath has pain in her hip    Review of Systems:   CONSTITUTIONAL: No documented fever. No fatigue, weakness. No weight gain, no weight loss.  EYES: No blurry or double vision.  ENT: No tinnitus. No postnasal drip. No redness of the oropharynx.  RESPIRATORY: No cough, no wheeze, no hemoptysis. No dyspnea.  CARDIOVASCULAR: No chest pain. No orthopnea. No palpitations. No syncope.  GASTROINTESTINAL: No nausea, no vomiting or diarrhea. No abdominal pain. No melena or hematochezia.  GENITOURINARY: No dysuria or hematuria.  ENDOCRINE: No polyuria or nocturia. No heat or cold intolerance.  HEMATOLOGY: No anemia. No bruising. No bleeding.  INTEGUMENTARY: No rashes. No lesions.  MUSCULOSKELETAL: Pain in the left hip No swelling. No gout.  NEUROLOGIC: No numbness, tingling, or ataxia. No seizure-type activity.  PSYCHIATRIC: No anxiety. No insomnia. No ADD.    Vitals:   Vitals:   02/12/16 1345 02/12/16 1401 02/12/16 1416 02/12/16 1431  BP: (!) 169/140 (!) 149/61 (!) 139/54 (!) 163/56  Pulse: 72 (!) 57 (!) 57 (!) 59  Resp: (!) 21 12 12 18   Temp:   97.6 F (36.4 C) 97.8 F (36.6 C)  TempSrc:    Axillary  SpO2: 97% 95% 93% 93%  Weight:      Height:        Wt Readings from Last 3 Encounters:  02/11/16 122 lb 9.6 oz (55.6 kg)  06/18/15 125 lb (56.7 kg)     Intake/Output Summary (Last 24 hours) at 02/12/16 1437 Last data filed at 02/12/16 1416  Gross per 24  hour  Intake          1818.75 ml  Output             2070 ml  Net          -251.25 ml    Physical Exam:   GENERAL: Pleasant-appearing in no apparent distress.  HEAD, EYES, EARS, NOSE AND THROAT: Atraumatic, normocephalic. Extraocular muscles are intact. Pupils equal and reactive to light. Sclerae anicteric. No conjunctival injection. No oro-pharyngeal erythema.  NECK: Supple. There is no jugular venous  distention. No bruits, no lymphadenopathy, no thyromegaly.  HEART: Regular rate and rhythm,. No murmurs, no rubs, no clicks.  LUNGS: Clear to auscultation bilaterally. No rales or rhonchi. No wheezes.  ABDOMEN: Soft, flat, nontender, nondistended. Has good bowel sounds. No hepatosplenomegaly appreciated.  EXTREMITIES: No evidence of any cyanosis, clubbing, or peripheral edema.  +2 pedal and radial pulses bilaterally.  NEUROLOGIC: The patient is alert, awake, and oriented x2with no focal motor or sensory deficits appreciated bilaterally.  SKIN: Moist and warm with no rashes appreciated.  Psych: Not anxious, depressed LN: No inguinal LN enlargement    Antibiotics   Anti-infectives    Start     Dose/Rate Route Frequency Ordered Stop   02/12/16 1700  ceFAZolin (ANCEF) IVPB 2g/100 mL premix     2 g 200 mL/hr over 30 Minutes Intravenous Every 6 hours 02/12/16 1435 02/13/16 0459   02/12/16 0955  ceFAZolin (ANCEF) 2-4 GM/100ML-% IVPB    Comments:  Slemenda, Debbie: cabinet override      02/12/16 0955 02/12/16 1119   02/11/16 2208  ceFAZolin (ANCEF) IVPB 2g/100 mL premix     2 g 200 mL/hr over 30 Minutes Intravenous 30 min pre-op 02/11/16 2208 02/12/16 1119      Medications   Scheduled Meds: .  ceFAZolin (ANCEF) IV  2 g Intravenous Q6H  . docusate sodium  100 mg Oral BID  . [START ON 02/13/2016] enoxaparin (LOVENOX) injection  40 mg Subcutaneous Q24H  . ferrous sulfate  325 mg Oral TID PC  . senna  1 tablet Oral BID  . sodium chloride       Continuous Infusions: . sodium chloride      PRN Meds:.acetaminophen **OR** acetaminophen, alum & mag hydroxide-simeth, bisacodyl, fentaNYL (SUBLIMAZE) injection, magnesium citrate, menthol-cetylpyridinium **OR** phenol, meperidine (DEMEROL) injection, methocarbamol **OR** methocarbamol (ROBAXIN)  IV, morphine injection, ondansetron **OR** ondansetron (ZOFRAN) IV, oxyCODONE, polyethylene glycol, promethazine   Data Review:   Micro Results Recent Results (from the past 240 hour(s))  Surgical pcr screen     Status: Abnormal   Collection Time: 02/12/16 12:45 AM  Result Value Ref Range Status   MRSA, PCR NEGATIVE NEGATIVE Final   Staphylococcus aureus POSITIVE (A) NEGATIVE Final    Comment:        The Xpert SA Assay (FDA approved for NASAL specimens in patients over 80 years of age), is one component of a comprehensive surveillance program.  Test performance has been validated by [facility] for patients greater than or equal to 376 year old. It is not intended to diagnose infection nor to guide or monitor treatment.     Radiology Reports Dg Chest 1 View  Result Date: 02/11/2016 CLINICAL DATA:  Fall with left hip fracture, preop. EXAM: CHEST 1 VIEW COMPARISON:  Most recent radiograph 06/17/2015. Most recent CT 06/07/2015 FINDINGS: Improved lung base aeration from prior radiographs, with residual or recurrent streaky opacity at the right lung base. Mild hyperinflation and emphysema. Left upper lobe nodule tentatively identified. Normal heart size and mediastinal contours. There is atherosclerosis of the aortic arch. No pulmonary edema, large pleural effusion or pneumothorax. Skin fold projects over the left lung base. Remote right rib fractures. IMPRESSION: 1. Streaky right lung base opacity may be chronic scarring, however recurrent atelectasis, aspiration, or pneumonia not entirely excluded. 2. Emphysema.  Thoracic aortic atherosclerosis. 3. Left upper lobe nodule tentatively identified, as seen on prior chest CT. Recommend  correlation with any interval imaging/intervention performed elsewhere. In the absence of interval imaging, recommend follow-up chest  CT on a nonemergent basis for nodule re-evaluation. Electronically Signed   By: Rubye Oaks M.D.   On: 02/11/2016 18:59   Ct Hip Left Wo Contrast  Result Date: 02/11/2016 CLINICAL DATA:  Left hip pain and fracture after fall. Pre-surgical planning. EXAM: CT OF THE LEFT HIP WITHOUT CONTRAST; 3-DIMENSIONAL CT IMAGE RENDERING ON ACQUISITION WORKSTATION TECHNIQUE: Multidetector CT imaging of the left hip was performed according to the standard protocol. Multiplanar CT image reconstructions were also generated. 3D reformats were provided. COMPARISON:  Radiographs earlier this day. FINDINGS: Bones/Joint/Cartilage Impacted subcapital femoral neck fracture with mild apex anterior angulation. No articular or intertrochanteric component. Acetabulum and pubic rami are intact. Ligaments Suboptimally assessed by CT. Muscles and Tendons No fatty infiltration of the included musculature. Soft tissues Expected soft tissue stranding. Foley catheter in place. Mild vascular calcifications. 3D reformats confirm the above findings. IMPRESSION: Impacted left femoral neck fracture with mild angulation. Electronically Signed   By: Rubye Oaks M.D.   On: 02/11/2016 23:08   Ct 3d Recon At Scanner  Result Date: 02/11/2016 CLINICAL DATA:  Left hip pain and fracture after fall. Pre-surgical planning. EXAM: CT OF THE LEFT HIP WITHOUT CONTRAST; 3-DIMENSIONAL CT IMAGE RENDERING ON ACQUISITION WORKSTATION TECHNIQUE: Multidetector CT imaging of the left hip was performed according to the standard protocol. Multiplanar CT image reconstructions were also generated. 3D reformats were provided. COMPARISON:  Radiographs earlier this day. FINDINGS: Bones/Joint/Cartilage Impacted subcapital femoral neck fracture with mild apex anterior angulation. No articular or intertrochanteric component. Acetabulum and  pubic rami are intact. Ligaments Suboptimally assessed by CT. Muscles and Tendons No fatty infiltration of the included musculature. Soft tissues Expected soft tissue stranding. Foley catheter in place. Mild vascular calcifications. 3D reformats confirm the above findings. IMPRESSION: Impacted left femoral neck fracture with mild angulation. Electronically Signed   By: Rubye Oaks M.D.   On: 02/11/2016 23:08   Dg Hip Port Unilat With Pelvis 1v Left  Result Date: 02/12/2016 CLINICAL DATA:  Left hip replacement. EXAM: DG HIP (WITH OR WITHOUT PELVIS) 1V PORT LEFT COMPARISON:  02/11/2016 FINDINGS: Left hip hemiarthroplasty in satisfactory position and alignment. No fracture or immediate complication. IMPRESSION: Satisfactory left hip hemiarthroplasty. Electronically Signed   By: Marlan Palau M.D.   On: 02/12/2016 14:18   Dg Hip Unilat With Pelvis 2-3 Views Left  Result Date: 02/11/2016 CLINICAL DATA:  Left hip pain after fall from standing position. Left leg shortening and rotation. EXAM: DG HIP (WITH OR WITHOUT PELVIS) 2-3V LEFT COMPARISON:  None. FINDINGS: Impacted fracture of the left subcapital femoral neck. No additional fracture of the bony pelvis. Pubic symphysis and sacroiliac joints are congruent. The bones appear under mineralized. IMPRESSION: Impacted left subcapital femoral neck fracture. Electronically Signed   By: Rubye Oaks M.D.   On: 02/11/2016 18:54     CBC  Recent Labs Lab 02/11/16 1823 02/12/16 0003 02/12/16 0408  WBC 14.4* 20.7* 17.6*  HGB 13.4 13.4 12.5  HCT 40.5 41.4 38.0  PLT 383 404 365  MCV 81.7 81.1 81.9  MCH 27.0 26.3 26.9  MCHC 33.1 32.4 32.8  RDW 15.2* 15.1* 15.1*  LYMPHSABS 2.4 1.1  --   MONOABS 1.1* 1.4*  --   EOSABS 0.4 0.1  --   BASOSABS 0.1 0.1  --     Chemistries   Recent Labs Lab 02/11/16 1823 02/12/16 0003 02/12/16 0408  NA 139 141 140  K 3.6 3.5 3.4*  CL 101 106 106  CO2 32 28 28  GLUCOSE  95 124* 99  BUN 22* 19 16  CREATININE  0.91 0.74 0.68  CALCIUM 9.0 9.1 8.8*  AST 18 18  --   ALT 12* 13*  --   ALKPHOS 95 94  --   BILITOT 0.4 0.9  --    ------------------------------------------------------------------------------------------------------------------ estimated creatinine clearance is 48.4 mL/min (by C-G formula based on SCr of 0.68 mg/dL). ------------------------------------------------------------------------------------------------------------------ No results for input(s): HGBA1C in the last 72 hours. ------------------------------------------------------------------------------------------------------------------ No results for input(s): CHOL, HDL, LDLCALC, TRIG, CHOLHDL, LDLDIRECT in the last 72 hours. ------------------------------------------------------------------------------------------------------------------ No results for input(s): TSH, T4TOTAL, T3FREE, THYROIDAB in the last 72 hours.  Invalid input(s): FREET3 ------------------------------------------------------------------------------------------------------------------ No results for input(s): VITAMINB12, FOLATE, FERRITIN, TIBC, IRON, RETICCTPCT in the last 72 hours.  Coagulation profile  Recent Labs Lab 02/11/16 1823 02/12/16 0003  INR 1.07 1.08    No results for input(s): DDIMER in the last 72 hours.  Cardiac Enzymes No results for input(s): CKMB, TROPONINI, MYOGLOBIN in the last 168 hours.  Invalid input(s): CK ------------------------------------------------------------------------------------------------------------------ Invalid input(s): POCBNP    Assessment & Plan  Patient is 82 year old status post left hip fracture 1.    COPD (chronic obstructive pulmonary disease) (HCC) no evidence of exasperation will resume when necessary nebulizers 2.  HTN (hypertension) blood pressure currently stable we'll continue to monitor 3.  Dementia  continue therapy with lorazepam as needed 4. Leukocytosis possible reactive in nature  we'll repeat a CBC chest x-ray reviewed negative urinalysis reviewed was negative follow temperature 5. Miscellaneous recommend Lovenox for DVT prophylaxis      Code Status Orders        Start     Ordered   02/11/16 2316  Do not attempt resuscitation (DNR)  Continuous    Question Answer Comment  In the event of cardiac or respiratory ARREST Do not call a "code blue"   In the event of cardiac or respiratory ARREST Do not perform Intubation, CPR, defibrillation or ACLS   In the event of cardiac or respiratory ARREST Use medication by any route, position, wound care, and other measures to relive pain and suffering. May use oxygen, suction and manual treatment of airway obstruction as needed for comfort.      02/11/16 2316    Code Status History    Date Active Date Inactive Code Status Order ID Comments User Context   02/11/2016 10:06 PM 02/11/2016 10:07 PM Full Code [id number]  Juanell Fairly, MD ED   06/20/2015  4:53 PM 06/21/2015  8:10 PM DNR [id number]  Suan Halter, MD Inpatient   06/19/2015  5:29 PM 06/20/2015  4:53 PM DNR [id number]  Suan Halter, MD Inpatient   06/12/2015 10:26 AM 06/19/2015  5:29 PM DNR [id number]  Erin Fulling, MD Inpatient   06/08/2015  2:51 AM 06/12/2015 10:26 AM Full Code [id number]  Houston Siren, MD ED           Consults  Orthopedics   DVT Prophylaxis  Lovenox  Lab Results  Component Value Date   PLT 365 02/12/2016     Time Spent in minutes   Greater than 50% of time spent in care coordination and counseling patient regarding the condition and plan of care.   Auburn Bilberry M.D on 02/12/2016 at 2:37 PM  Between 7am to 6pm - Pager - [phone]  After 6pm go to [email or web address] - password [email or web address] [facility]  [facility]   Office  [phone]

## 2016-02-12 NOTE — Anesthesia Procedure Notes (Signed)
Date/Time: 02/12/2016 10:20 AM Performed by: Stormy FabianURTIS, Sylus Stgermain Pre-anesthesia Checklist: Patient identified, Emergency Drugs available, Suction available and Patient being monitored Patient Re-evaluated:Patient Re-evaluated prior to inductionOxygen Delivery Method: Simple face mask Intubation Type: IV induction Dental Injury: Teeth and Oropharynx as per pre-operative assessment

## 2016-02-12 NOTE — Anesthesia Preprocedure Evaluation (Addendum)
Anesthesia Evaluation  Patient identified by MRN, date of birth, ID band Patient awake    Reviewed: Allergy & Precautions, NPO status , Patient's Chart, lab work & pertinent test results  History of Anesthesia Complications Negative for: history of anesthetic complications  Airway Mallampati: II  TM Distance: >3 FB Neck ROM: Full    Dental  (+) Loose, Poor Dentition,    Pulmonary neg sleep apnea, COPD (uses O2 at night),  COPD inhaler and oxygen dependent, Current Smoker,    breath sounds clear to auscultation- rhonchi (-) wheezing      Cardiovascular hypertension, Pt. on medications (-) CAD and (-) Past MI  Rhythm:Regular Rate:Normal - Systolic murmurs and - Diastolic murmurs    Neuro/Psych negative neurological ROS     GI/Hepatic negative GI ROS, Neg liver ROS,   Endo/Other  negative endocrine ROSneg diabetes  Renal/GU negative Renal ROS     Musculoskeletal negative musculoskeletal ROS (+)   Abdominal (+) - obese,   Peds  Hematology negative hematology ROS (+)   Anesthesia Other Findings   Reproductive/Obstetrics                             Anesthesia Physical Anesthesia Plan  ASA: III  Anesthesia Plan: Spinal   Post-op Pain Management:    Induction:   Airway Management Planned: Natural Airway  Additional Equipment:   Intra-op Plan:   Post-operative Plan:   Informed Consent: I have reviewed the patients History and Physical, chart, labs and discussed the procedure including the risks, benefits and alternatives for the proposed anesthesia with the patient or authorized representative who has indicated his/her understanding and acceptance.   Dental advisory given and Consent reviewed with POA  Plan Discussed with: CRNA and Anesthesiologist  Anesthesia Plan Comments: (Verified with patient's living facility that she is not on any blood thinners and only uses O2 at night.  Consent obtained from patient's son Herbert [place]az[place]Jordan over the phone. Plan to suspend DNR for perioperative period.)       Lab Results  Component Value Date   WBC 17.6 (H) 02/12/2016   HGB 12.5 02/12/2016   HCT 38.0 02/12/2016   MCV 81.9 02/12/2016   PLT 365 02/12/2016    Anesthesia Quick Evaluation

## 2016-02-12 NOTE — Progress Notes (Signed)
On assessment patient oriented to person and disoriented to place and situation. Patient having left hip pain with movement. Patient has some bruising on both forearms. Patient just received CHG bath. Patient's son, Herbert [place]azil[place], did a medical and blood consent over the telephone with RN and Signa KellKellie RN. Patient on 2L O2 and IV fluids running. Patient foley catheter placed by night nurse last night.   Harvie HeckMelanie Levy Wellman, RN

## 2016-02-12 NOTE — Transfer of Care (Signed)
Immediate Anesthesia Transfer of Care Note  Patient: Leah [name]  Procedure(s) Performed: Procedure(s): ARTHROPLASTY BIPOLAR HIP (HEMIARTHROPLASTY) (Left)  Patient Location: PACU  Anesthesia Type:Spinal  Level of Consciousness: awake  Airway & Oxygen Therapy: Patient Spontanous Breathing and Patient connected to face mask oxygen  Post-op Assessment: Report given to RN and Post -op Vital signs reviewed and stable  Post vital signs: Reviewed and stable  Last Vitals:  Vitals:   02/12/16 0946 02/12/16 1301  BP: (!) 160/92 135/69  Pulse: 70 84  Resp: 20 (!) 25  Temp: 37 C 36.3 C    Complications: No apparent anesthesia complications

## 2016-02-12 NOTE — Clinical Social Work Note (Signed)
Clinical Social Work Assessment  Patient Details  Name: Leah [name] MRN: [id number] Date of Birth: 03/19/1932  Date of referral:  02/12/16               Reason for consult:  Other (Comment Required) (From [facility]. )                Permission sought to share information with:  Oceanographeracility Contact Representative Permission granted to share information::  Yes, Verbal Permission Granted  Name::      [facility]  Agency::   Skilled Nursing Facility   Relationship::     Contact Information:     Housing/Transportation Living arrangements for the past 2 months:  Skilled Building surveyorursing Facility Source of Information:  Adult Children Patient Interpreter Needed:  None Criminal Activity/Legal Involvement Pertinent to Current Situation/Hospitalization:  No - Comment as needed Significant Relationships:  Adult Children Lives with:  Facility Resident Do you feel safe going back to the place where you live?  Yes Need for family participation in patient care:  Yes (Comment)  Care giving concerns:  Patient is a long term care resident at [facility].    Social Worker assessment / plan:  Visual merchandiserClinical Social Worker (CSW) reviewed chart and noted that patient is from [facility]. Patient had surgery today for a hip fracture. Per [facility] admissions coordinator at [facility] patient is a long term care resident and can return when stable. CSW contacted patient's daughter in law Zella BallRobin. Per Zella BallRobin patient's son Siri ColeHerbert is at work this afternoon. Zella BallRobin reported that in February 2017 patient went to hospice and out lived hospice and was placed at [facility]. Zella BallRobin is agreeable for patient to return to [facility].   FL2 complete. CSW will continue to follow and assist as needed.    Employment status:  Retired Health and safety inspectornsurance information:  Medicare PT Recommendations:  Not assessed at this time Information / Referral to community resources:  Skilled Nursing Facility  Patient/Family's  Response to care:  Daughter in Social workerlaw and son are agreeable for patient to return to [facility].   Patient/Family's Understanding of and Emotional Response to Diagnosis, Current Treatment, and Prognosis:  Daughter in law was very pleasant and thanked CSW for calling.   Emotional Assessment Appearance:  Appears stated age Attitude/Demeanor/Rapport:    Affect (typically observed):  Unable to Assess Orientation:  Oriented to Self, Fluctuating Orientation (Suspected and/or reported Sundowners) Alcohol / Substance use:  Not Applicable Psych involvement (Current and /or in the community):  No (Comment)  Discharge Needs  Concerns to be addressed:  Discharge Planning Concerns Readmission within the last 30 days:  No Current discharge risk:  Dependent with Mobility Barriers to Discharge:  Continued Medical Work up   [employer], Darleen CrockerBailey M, LCSW 02/12/2016, 2:48 PM

## 2016-02-12 NOTE — Anesthesia Procedure Notes (Addendum)
Spinal  Patient location during procedure: OR Start time: 02/12/2016 10:29 AM End time: 02/12/2016 10:56 AM Staffing Anesthesiologist: Emmie Niemann Resident/CRNA: Doreen Salvage Performed: resident/CRNA and anesthesiologist  Preanesthetic Checklist Completed: patient identified, site marked, surgical consent, pre-op evaluation, timeout performed, IV checked, risks and benefits discussed and monitors and equipment checked Spinal Block Patient position: sitting Prep: ChloraPrep Patient monitoring: heart rate, continuous pulse ox, blood pressure and cardiac monitor Approach: midline Location: L4-5 Injection technique: single-shot Needle Needle type: Whitacre and Introducer  Needle gauge: 24 G Needle length: 9 cm Additional Notes Negative paresthesia. Negative blood return. Positive free-flowing CSF. Expiration date of kit checked and confirmed. Patient tolerated procedure well, without complications. 3 mls of 0.5% bupivacaine injected after positive swirl

## 2016-02-12 NOTE — Consult Note (Signed)
ORTHOPAEDIC CONSULTATION  REQUESTING PHYSICIAN: Auburn BilberryShreyang Patel, MD  Chief Complaint: Left hip pain  HPI: Leah [name] is a 82 y.o. female who complains of left hip pain after a fall at her nursing facility. Patient has dementia. Information was obtained from her family.  Past Medical History:  Diagnosis Date  . COPD (chronic obstructive pulmonary disease) (HCC)   . Dementia   . Essential hypertension    Past Surgical History:  Procedure Laterality Date  . NO PAST SURGERIES     Social History   Social History  . Marital status: Widowed    Spouse name: N/A  . Number of children: N/A  . Years of education: N/A   Social History Main Topics  . Smoking status: Current Every Day Smoker    Packs/day: 1.00    Years: 60.00    Types: Cigarettes  . Smokeless tobacco: Never Used  . Alcohol use No  . Drug use: No  . Sexual activity: Not Asked   Other Topics Concern  . None   Social History Narrative  . None   Family History  Problem Relation Age of Onset  . Family history unknown: Yes   No Known Allergies Prior to Admission medications   Medication Sig Start Date End Date Taking? Authorizing Provider  acetaminophen (TYLENOL) 325 MG tablet Take 2 tablets (650 mg total) by mouth every 4 (four) hours as needed for mild pain or fever. 06/20/15   Suan HalterMargaret F Campbell, MD  bisacodyl (DULCOLAX) 10 MG suppository Place 1 suppository (10 mg total) rectally daily as needed for moderate constipation. 06/20/15   Suan HalterMargaret F Campbell, MD  ipratropium-albuterol (DUONEB) 0.5-2.5 (3) MG/3ML SOLN Take 3 mLs by nebulization every 2 (two) hours as needed. 06/20/15   Suan HalterMargaret F Campbell, MD  LORazepam (ATIVAN) 0.5 MG tablet Take 1 tablet (0.5 mg total) by mouth every 4 (four) hours as needed for anxiety. 06/20/15   Suan HalterMargaret F Campbell, MD  Morphine Sulfate (MORPHINE CONCENTRATE) 10 MG/0.5ML SOLN concentrated solution Take 0.25 mLs (5 mg total) by mouth every hour as needed for moderate pain,  severe pain or shortness of breath. 06/20/15   Suan HalterMargaret F Campbell, MD  Morphine Sulfate (MORPHINE CONCENTRATE) 10 MG/0.5ML SOLN concentrated solution Take 0.25 mLs (5 mg total) by mouth every 6 (six) hours. 06/20/15   Suan HalterMargaret F Campbell, MD  prochlorperazine (COMPAZINE) 25 MG suppository Place 1 suppository (25 mg total) rectally every 8 (eight) hours as needed for nausea or vomiting. 06/20/15   Suan HalterMargaret F Campbell, MD  senna-docusate (SENOKOT-S) 8.6-50 MG tablet Take 1 tablet by mouth 2 (two) times daily. 06/20/15   Suan HalterMargaret F Campbell, MD   Dg Chest 1 View  Result Date: 02/11/2016 CLINICAL DATA:  Fall with left hip fracture, preop. EXAM: CHEST 1 VIEW COMPARISON:  Most recent radiograph 06/17/2015. Most recent CT 06/07/2015 FINDINGS: Improved lung base aeration from prior radiographs, with residual or recurrent streaky opacity at the right lung base. Mild hyperinflation and emphysema. Left upper lobe nodule tentatively identified. Normal heart size and mediastinal contours. There is atherosclerosis of the aortic arch. No pulmonary edema, large pleural effusion or pneumothorax. Skin fold projects over the left lung base. Remote right rib fractures. IMPRESSION: 1. Streaky right lung base opacity may be chronic scarring, however recurrent atelectasis, aspiration, or pneumonia not entirely excluded. 2. Emphysema.  Thoracic aortic atherosclerosis. 3. Left upper lobe nodule tentatively identified, as seen on prior chest CT. Recommend correlation with any interval imaging/intervention performed elsewhere. In the absence of interval  imaging, recommend follow-up chest CT on a nonemergent basis for nodule re-evaluation. Electronically Signed   By: Leah [name] M.D.   On: 02/11/2016 18:59   Ct Hip Left Wo Contrast  Result Date: 02/11/2016 CLINICAL DATA:  Left hip pain and fracture after fall. Pre-surgical planning. EXAM: CT OF THE LEFT HIP WITHOUT CONTRAST; 3-DIMENSIONAL CT IMAGE RENDERING ON ACQUISITION  WORKSTATION TECHNIQUE: Multidetector CT imaging of the left hip was performed according to the standard protocol. Multiplanar CT image reconstructions were also generated. 3D reformats were provided. COMPARISON:  Radiographs earlier this day. FINDINGS: Bones/Joint/Cartilage Impacted subcapital femoral neck fracture with mild apex anterior angulation. No articular or intertrochanteric component. Acetabulum and pubic rami are intact. Ligaments Suboptimally assessed by CT. Muscles and Tendons No fatty infiltration of the included musculature. Soft tissues Expected soft tissue stranding. Foley catheter in place. Mild vascular calcifications. 3D reformats confirm the above findings. IMPRESSION: Impacted left femoral neck fracture with mild angulation. Electronically Signed   By: Leah [name] M.D.   On: 02/11/2016 23:08   Ct 3d Recon At Scanner  Result Date: 02/11/2016 CLINICAL DATA:  Left hip pain and fracture after fall. Pre-surgical planning. EXAM: CT OF THE LEFT HIP WITHOUT CONTRAST; 3-DIMENSIONAL CT IMAGE RENDERING ON ACQUISITION WORKSTATION TECHNIQUE: Multidetector CT imaging of the left hip was performed according to the standard protocol. Multiplanar CT image reconstructions were also generated. 3D reformats were provided. COMPARISON:  Radiographs earlier this day. FINDINGS: Bones/Joint/Cartilage Impacted subcapital femoral neck fracture with mild apex anterior angulation. No articular or intertrochanteric component. Acetabulum and pubic rami are intact. Ligaments Suboptimally assessed by CT. Muscles and Tendons No fatty infiltration of the included musculature. Soft tissues Expected soft tissue stranding. Foley catheter in place. Mild vascular calcifications. 3D reformats confirm the above findings. IMPRESSION: Impacted left femoral neck fracture with mild angulation. Electronically Signed   By: Leah [name] M.D.   On: 02/11/2016 23:08   Dg Hip Unilat With Pelvis 2-3 Views Left  Result Date:  02/11/2016 CLINICAL DATA:  Left hip pain after fall from standing position. Left leg shortening and rotation. EXAM: DG HIP (WITH OR WITHOUT PELVIS) 2-3V LEFT COMPARISON:  None. FINDINGS: Impacted fracture of the left subcapital femoral neck. No additional fracture of the bony pelvis. Pubic symphysis and sacroiliac joints are congruent. The bones appear under mineralized. IMPRESSION: Impacted left subcapital femoral neck fracture. Electronically Signed   By: Leah [name] M.D.   On: 02/11/2016 18:54    Positive ROS: All other systems have been reviewed and were otherwise negative with the exception of those mentioned in the HPI and as above.  Physical Exam: General: Alert, no acute distress  MUSCULOSKELETAL: Left lower extremity: Patient's skin overlying the left hip is intact. There is no erythema or ecchymosis or swelling.  Thigh compartments are soft and compressible. She has palpable pedal pulses and intact sensation light touch and intact motor function in both lower extremities. She can flex and extend her toes and dorsiflex and plantarflex her ankle. Patient has slight external rotation to the left lower extremity.  Assessment: Left femoral neck hip fracture  Plan: I reviewed the patient's x-rays and CT scan. Patient has posterior displacement of the femoral head in relation to the femoral neck.  The displacement combined with the patient's dementia, which will make it difficult for her to continue applied with postop nonweightbearing restrictions, are reason I'm recommending a left hip hemiarthroplasty. This will allow the patient to weight-bear as tolerated in left lower extremity medially following surgery.  I discussed treatment options with Mr. Leah [name].  We discussed nonoperative treatment, treatment with cannulated screws versus hemiarthroplasty. He was in agreement with the plan for hemiarthroplasty.     We discussed the risks benefits of surgery including infection, nerve or  blood vessel injury especially injury to the sciatic nerve which may lead to foot drop, leg length discrepancy, change in lower extremity rotation, fracture, dislocation, failure of the hardware and persistent left hip pain and the need for further surgery including conversion to a total hip arthroplasty. Medical risks were explained as well. He understands these include but are not limited to DVT and pulmonary embolism, myocardial infarction, stroke, pneumonia, respiratory failure and death.  Mr. [name] understood these risks and wished to proceed with surgery. Patient's nurse Leah [name] and the charge nurse Leah [name] witnessed consent after speaking with Mr. [name] on the phone.  Patient has been cleared for surgery by the hospitalist service. She is deemed to moderate risk for surgery. I reviewed the patient's laboratories and regular Studies in preparation for this case. Patient has been nothing by mouth after midnight. She is not on anticoagulation. Patient scheduled for surgery later this morning.    Leah [name], Leah Taha, MD    02/12/2016 8:58 AM

## 2016-02-12 NOTE — NC FL2 (Signed)
[place] MEDICAID FL2 LEVEL OF CARE SCREENING TOOL     IDENTIFICATION  Patient Name: Leah [name] Birthdate: 03-Jun-1932 Sex: female Admission Date (Current Location): 02/11/2016  [place]   Facility and Address:  [facility], [address], [place], [place] [address]      Provider Number: [id number]  Attending Physician Name and Address:  Auburn BilberryShreyang Patel, MD  Relative Name and Phone Number:       Current Level of Care: Hospital Recommended Level of Care: Skilled Nursing Facility Prior Approval Number:    Date Approved/Denied:   PASRR Number:  [id number])  Discharge Plan: SNF    Current Diagnoses: Patient Active Problem List   Diagnosis Date Noted  . Closed left hip fracture (HCC) 02/11/2016  . COPD (chronic obstructive pulmonary disease) (HCC) 02/11/2016  . HTN (hypertension) 02/11/2016  . Dementia 02/11/2016  . Pressure ulcer 06/21/2015  . Acute on chronic respiratory failure with hypoxia and hypercapnia (HCC)   . Acute respiratory failure (HCC)   . Pneumonia 06/08/2015  . Sepsis (HCC) 06/07/2015    Orientation RESPIRATION BLADDER Height & Weight     Self  Normal Incontinent Weight: 122 lb 9.6 oz (55.6 kg) Height:  5\' 6"  (167.6 cm)  BEHAVIORAL SYMPTOMS/MOOD NEUROLOGICAL BOWEL NUTRITION STATUS   (none)  (none) Continent Diet (NPO for surgery (see D/C Summary for updated diet) )  AMBULATORY STATUS COMMUNICATION OF NEEDS Skin   Extensive Assist Verbally Normal                       Personal Care Assistance Level of Assistance  Bathing, Feeding, Dressing Bathing Assistance: Limited assistance Feeding assistance: Independent Dressing Assistance: Limited assistance     Functional Limitations Info  Sight, Hearing, Speech Sight Info: Adequate Hearing Info: Adequate Speech Info: Adequate    SPECIAL CARE FACTORS FREQUENCY  PT (By licensed PT)     PT Frequency:  (5)               Contractures      Additional Factors Info  Code Status, Allergies Code Status Info:  (DNR) Allergies Info:  (No Known Allergies)           Current Medications (02/12/2016):  This is the current hospital active medication list Current Facility-Administered Medications  Medication Dose Route Frequency Provider Last Rate Last Dose  . [MAR Hold] acetaminophen (TYLENOL) tablet 650 mg  650 mg Oral Q6H PRN Oralia Manisavid Willis, MD       Or  . Mitzi Hansen[MAR Hold] acetaminophen (TYLENOL) suppository 650 mg  650 mg Rectal Q6H PRN Oralia Manisavid Willis, MD      . ceFAZolin (ANCEF) 2-4 GM/100ML-% IVPB           . [MAR Hold] ceFAZolin (ANCEF) IVPB 2g/100 mL premix  2 g Intravenous 30 min Pre-Op Juanell FairlyKevin Krasinski, MD      . Mitzi Hansen[MAR Hold] ipratropium-albuterol (DUONEB) 0.5-2.5 (3) MG/3ML nebulizer solution 3 mL  3 mL Nebulization Q2H PRN Oralia Manisavid Willis, MD      . lactated ringers infusion   Intravenous Continuous Amy Penwarden, MD      . Mitzi Hansen[MAR Hold] LORazepam (ATIVAN) tablet 0.5 mg  0.5 mg Oral Q4H PRN Oralia Manisavid Willis, MD      . Mitzi Hansen[MAR Hold] methocarbamol (ROBAXIN) tablet 500 mg  500 mg Oral Q6H PRN Juanell FairlyKevin Krasinski, MD       Or  . Mitzi Hansen[MAR Hold] methocarbamol (ROBAXIN) 500 mg in dextrose 5 % 50 mL  IVPB  500 mg Intravenous Q6H PRN Juanell FairlyKevin Krasinski, MD      . Mitzi Hansen[MAR Hold] morphine 2 MG/ML injection 2 mg  2 mg Intravenous Q4H PRN Oralia Manisavid Willis, MD      . Mitzi Hansen[MAR Hold] morphine 4 MG/ML injection 2 mg  2 mg Intravenous Q2H PRN Juanell FairlyKevin Krasinski, MD      . Mitzi Hansen[MAR Hold] morphine CONCENTRATE 10 MG/0.5ML oral solution 5 mg  5 mg Oral Q6H Oralia Manisavid Willis, MD   5 mg at 02/12/16 0541  . [MAR Hold] ondansetron (ZOFRAN) tablet 4 mg  4 mg Oral Q6H PRN Oralia Manisavid Willis, MD       Or  . Mitzi Hansen[MAR Hold] ondansetron [facility](ZOFRAN) injection 4 mg  4 mg Intravenous Q6H PRN Oralia Manisavid Willis, MD      . Mitzi Hansen[MAR Hold] oxyCODONE (Oxy IR/ROXICODONE) immediate release tablet 5-10 mg  5-10 mg Oral Q4H PRN Juanell FairlyKevin Krasinski, MD      . Mitzi Hansen[MAR Hold] senna-docusate (Senokot-S) tablet 1 tablet  1 tablet Oral  BID Oralia Manisavid Willis, MD   Stopped at 02/12/16 1000     Discharge Medications: Please see discharge summary for a list of discharge medications.  Relevant Imaging Results:  Relevant Lab Results:   Additional Information  (SSN: [id number])  Abdirahim Flavell, Darleen CrockerBailey M, LCSW

## 2016-02-13 ENCOUNTER — Inpatient Hospital Stay: Payer: Medicare Other

## 2016-02-13 ENCOUNTER — Encounter: Payer: Self-pay | Admitting: Orthopedic Surgery

## 2016-02-13 LAB — CBC
HCT: 34.4 % — ABNORMAL LOW (ref 35.0–47.0)
Hemoglobin: 11.3 g/dL — ABNORMAL LOW (ref 12.0–16.0)
MCH: 26.7 pg (ref 26.0–34.0)
MCHC: 32.9 g/dL (ref 32.0–36.0)
MCV: 81.1 fL (ref 80.0–100.0)
PLATELETS: 312 10*3/uL (ref 150–440)
RBC: 4.24 MIL/uL (ref 3.80–5.20)
RDW: 15.3 % — AB (ref 11.5–14.5)
WBC: 20.3 10*3/uL — AB (ref 3.6–11.0)

## 2016-02-13 LAB — BASIC METABOLIC PANEL
Anion gap: 7 (ref 5–15)
BUN: 14 mg/dL (ref 6–20)
CALCIUM: 8.3 mg/dL — AB (ref 8.9–10.3)
CO2: 27 mmol/L (ref 22–32)
Chloride: 103 mmol/L (ref 101–111)
Creatinine, Ser: 0.76 mg/dL (ref 0.44–1.00)
GFR calc Af Amer: >60 mL/min (ref >60)
GLUCOSE: 115 mg/dL — AB (ref 65–99)
Potassium: 3.4 mmol/L — ABNORMAL LOW (ref 3.5–5.1)
Sodium: 137 mmol/L (ref 135–145)

## 2016-02-13 LAB — GLUCOSE, CAPILLARY
Glucose-Capillary: 87 mg/dL (ref 65–99)
Glucose-Capillary: 124 mg/dL — ABNORMAL HIGH (ref 65–99)
Glucose-Capillary: 119 mg/dL — ABNORMAL HIGH (ref 65–99)
Glucose-Capillary: 131 mg/dL — ABNORMAL HIGH (ref 65–99)

## 2016-02-13 LAB — BRAIN NATRIURETIC PEPTIDE: B Natriuretic Peptide: 95 pg/mL (ref 0.0–100.0)

## 2016-02-13 MED ORDER — MIDODRINE HCL 5 MG PO TABS
5.0000 mg | ORAL_TABLET | Freq: Three times a day (TID) | ORAL | Status: DC
Start: 2016-02-13 — End: 2016-02-14
  Administered 2016-02-13 – 2016-02-14 (×3): 5 mg via ORAL
  Filled 2016-02-13 (×3): qty 1

## 2016-02-13 MED ORDER — SODIUM CHLORIDE 0.9 % IV BOLUS (SEPSIS)
500.0000 mL | Freq: Once | INTRAVENOUS | Status: AC
  Administered 2016-02-13: 500 mL via INTRAVENOUS

## 2016-02-13 MED ORDER — IBUPROFEN 400 MG PO TABS
800.0000 mg | ORAL_TABLET | Freq: Three times a day (TID) | ORAL | Status: DC
  Administered 2016-02-13 (×2): 800 mg via ORAL
  Filled 2016-02-13 (×2): qty 2

## 2016-02-13 MED ORDER — TRAMADOL HCL 50 MG PO TABS
50.0000 mg | ORAL_TABLET | Freq: Four times a day (QID) | ORAL | Status: DC | PRN
  Administered 2016-02-13 – 2016-02-16 (×3): 50 mg via ORAL
  Filled 2016-02-13 (×3): qty 1

## 2016-02-13 MED ORDER — IPRATROPIUM-ALBUTEROL 0.5-2.5 (3) MG/3ML IN SOLN: 3.0000 mL | RESPIRATORY_TRACT | Status: DC | PRN

## 2016-02-13 MED ORDER — POTASSIUM CHLORIDE CRYS ER 20 MEQ PO TBCR
40.0000 meq | EXTENDED_RELEASE_TABLET | Freq: Once | ORAL | Status: AC
  Administered 2016-02-13: 40 meq via ORAL
  Filled 2016-02-13 (×2): qty 2

## 2016-02-13 MED ORDER — CEFTRIAXONE SODIUM-DEXTROSE 1-3.74 GM-% IV SOLR
1.0000 g | INTRAVENOUS | Status: DC
  Administered 2016-02-13: 1 g via INTRAVENOUS
  Filled 2016-02-13: qty 50

## 2016-02-13 MED ORDER — DEXTROSE 5 % IV SOLN: 1.0000 g | INTRAVENOUS | Status: DC

## 2016-02-13 MED ORDER — LEVOFLOXACIN IN D5W 500 MG/100ML IV SOLN
500.0000 mg | INTRAVENOUS | Status: DC
  Administered 2016-02-13: 500 mg via INTRAVENOUS
  Filled 2016-02-13 (×2): qty 100

## 2016-02-13 MED ORDER — LORAZEPAM 0.5 MG PO TABS: 0.5000 mg | ORAL_TABLET | ORAL | Status: DC | PRN

## 2016-02-13 NOTE — Progress Notes (Signed)
Patient BP still low. Patient not symptomatic. MD notified. No new orders at this time.  Harvie HeckMelanie Kylei Purington, RN

## 2016-02-13 NOTE — Progress Notes (Signed)
OT Cancellation Note  Patient Details Name: Leah [name] MRN: [id number] DOB: September 23, 1932   Cancelled Treatment:    Reason Eval/Treat Not Completed: Medical issues which prohibited therapy (Attempted initial evaluation, however pt. with low BP, and receiving a bolus per nursing. Will continue to monitor and eval as appropriate.)  Olegario MessierElaine Lamarcus Spira, MS, OTR/L 02/13/2016, 10:47 AM

## 2016-02-13 NOTE — Progress Notes (Addendum)
Patient will [facility] back [facility] when stable. Clinical Social Worker (CSW) will continue to follow and assist as needed.   Patient's son Siri ColeHerbert is in agreement with the plan.   Baker Hughes IncorporatedBailey Carron Jaggi, LCSW [phone]

## 2016-02-13 NOTE — Evaluation (Signed)
Physical Therapy Evaluation Patient Details Name: Leah [name] MRN: [id number] DOB: Jan 12, 1933 Today's Date: 02/13/2016   History of Present Illness  Pt is an 82 y.o female s/p fall onto L hip sustaining impacted L subcapital femoral neck fx with displacement.  Pt s/p L hip hemiarthroplasty 02/12/16 with posterolateral approach.  PMH includes dementia, COPD, htn, acute on chronic respiratory failure with hypoxia and hypercapnia.  Clinical Impression  Pt appearing to be a poor historian and unable to provide details regarding PLOF.  [facility].  Unable to get OOB d/t pt's BP 85/31 semi-supine in bed but pt was noted to be max assist logrolling to R in bed (complicated d/t L hip pain with movement).  Pt was able to participate in LE's ex's for strengthening during session.  Pt would benefit from skilled PT to address noted impairments and functional limitations.  Pending further assessment, anticipate pt would benefit from discharge to STR when medically appropriate.    Follow Up Recommendations SNF    Equipment Recommendations   (TBD)    Recommendations for Other Services       Precautions / Restrictions Precautions Precautions: Posterior Hip;Fall Restrictions Weight Bearing Restrictions: Yes LLE Weight Bearing: Weight bearing as tolerated      Mobility  Bed Mobility Overal bed mobility: Needs Assistance Bed Mobility: Rolling Rolling: Max assist (logrolling to R with assist for L LE to maintain precautions; limited d/t L hip pain)         General bed mobility comments: Deferred OOB d/t pt's BP 85/31 semi-supine in bed (nursing took during session prior to OOB mobility)  Transfers                    Ambulation/Gait                Stairs            Wheelchair Mobility    Modified Rankin (Stroke Patients Only)       Balance Overall balance assessment: History of Falls                                            Pertinent Vitals/Pain Pain Assessment: Faces Faces Pain Scale: Hurts whole lot ((pt stating no pain at rest)) Pain Location: L hip with movement Pain Descriptors / Indicators: Sharp;Shooting;Operative site guarding (with movement) Pain Intervention(s): Limited activity within patient's tolerance;Monitored during session;Premedicated before session;Repositioned;Ice applied    Home Living Family/patient expects to be discharged to:: Skilled nursing facility                 Additional Comments: [facility]    Prior Function           Comments: Pt not able to answer questions to determine PLOF.  Per notes pt uses O2 PRN.     Hand Dominance        Extremity/Trunk Assessment   Upper Extremity Assessment: Defer to OT evaluation           Lower Extremity Assessment: Difficult to assess due to impaired cognition;RLE deficits/detail;LLE deficits/detail RLE Deficits / Details: hip flexion, knee flexion/extension, and DF at least 3+/5 LLE Deficits / Details: hip flexion at least 3-/5 (limited d/t pain); knee flexion/extension and DF at least 3+/5     Communication   Communication: HOH  Cognition Arousal/Alertness: Awake/alert  Behavior During Therapy: WFL for tasks assessed/performed Overall Cognitive Status: No family/caregiver present to determine baseline cognitive functioning                      General Comments   Nursing cleared pt for participation in physical therapy (bed level per pt tolerance d/t low BP).  Pt agreeable to PT session.    Exercises Total Joint Exercises Ankle Circles/Pumps: AROM;Strengthening;Both;10 reps;Supine Quad Sets: AROM;Strengthening;Both;Supine (2 reps AROM (limited d/t cognition)) Towel Squeeze: AROM;Strengthening;Both;Supine (2 reps AROM (limited d/t cognition)) Short Arc Quad: AAROM;Strengthening;Both;10 reps;Supine Heel Slides: AAROM;Strengthening;Both;10 reps;Supine Hip  ABduction/ADduction: AAROM;Strengthening;Both;10 reps;Supine  Pt requiring vc's and tactile cues for ex's.   Assessment/Plan    PT Assessment Patient needs continued PT services  PT Problem List Decreased strength;Decreased activity tolerance;Decreased balance;Decreased mobility;Decreased knowledge of use of DME;Decreased knowledge of precautions;Pain          PT Treatment Interventions DME instruction;Gait training;Functional mobility training;Therapeutic activities;Therapeutic exercise;Balance training;Patient/family education    PT Goals (Current goals can be found in the Care Plan section)  Acute Rehab PT Goals Patient Stated Goal: to have less pain PT Goal Formulation: With patient Time For Goal Achievement: 02/27/16 Potential to Achieve Goals: Fair    Frequency BID   Barriers to discharge Decreased caregiver support      Co-evaluation               End of Session Equipment Utilized During Treatment: Oxygen (2 L) Activity Tolerance: Patient limited by pain;Treatment limited secondary to medical complications (Comment) (low BP limiting OOB) Patient left: in bed;with call bell/phone within reach;with bed alarm set;with SCD's reapplied (B heels elevated via towel rolls; ice pack in place; hip abduction pillow in place) Nurse Communication: Mobility status;Precautions;Weight bearing status         Time: [id number] PT Time Calculation (min) (ACUTE ONLY): 25 min   Charges:   PT Evaluation $PT Eval Low Complexity: 1 Procedure PT Treatments $Therapeutic Exercise: 8-22 mins   PT G CodesHendricks Limes:        Leah [name] 02/13/2016, 12:33 PM Hendricks LimesEmily Marsalis Beaulieu, PT [phone]

## 2016-02-13 NOTE — Progress Notes (Signed)
Patient BP 80's/ 30's. MD aware. RN will give Midodrine.  Harvie HeckMelanie Yomaira Solar, RN

## 2016-02-13 NOTE — Progress Notes (Signed)
Patient fever 101.0. Tylenol given

## 2016-02-13 NOTE — Anesthesia Postprocedure Evaluation (Signed)
Anesthesia Post Note  Patient: Leah [name]  Procedure(s) Performed: Procedure(s) (LRB): ARTHROPLASTY BIPOLAR HIP (HEMIARTHROPLASTY) (Left)  Patient location during evaluation: Nursing Unit Anesthesia Type: Spinal Level of consciousness: awake and alert and confused Pain management: satisfactory to patient Vital Signs Assessment: post-procedure vital signs reviewed and stable Respiratory status: spontaneous breathing Cardiovascular status: stable Postop Assessment: no headache, no backache, spinal receding, patient able to bend at knees and no signs of nausea or vomiting Anesthetic complications: no    Last Vitals:  Vitals:   02/13/16 0356 02/13/16 0550  BP: (!) 111/43   Pulse: 87   Resp: 18   Temp: (!) 38.1 C 37.2 C    Last Pain:  Vitals:   02/13/16 0550  TempSrc: Oral  PainSc:                  Leah [name],  Leah [name]

## 2016-02-13 NOTE — Progress Notes (Signed)
Patient O2 sats 89% on 2 L O2 nasal cannula. RN helped patient with incentive spirometer. MD aware and ordered chest x ray and BNP.   Harvie HeckMelanie Elhadji Pecore, RN

## 2016-02-13 NOTE — Progress Notes (Signed)
Subjective:  POD #1  S/p left hip hemiarthroplasty.  Patient reports pain as mild.  She is having no pain unless she moves the hip.    Objective:   VITALS:   Vitals:   02/13/16 1023 02/13/16 1158 02/13/16 1242 02/13/16 1330  BP: (!) 88/30 (!) 85/31 (!) 88/32 (!) 117/46  Pulse: 68 71    Resp:      Temp:      TempSrc:      SpO2: 92% 92%    Weight:      Height:        PHYSICAL EXAM:  Left lower extremity:   Neurovascular intact Sensation intact distally Intact pulses distally Dorsiflexion/Plantar flexion intact Incision: dressing C/D/I No cellulitis present Compartment soft  LABS  Results for orders placed or performed during the hospital encounter of 02/11/16 (from the past 24 hour(s))  CBC     Status: Abnormal   Collection Time: 02/13/16  3:13 AM  Result Value Ref Range   WBC 20.3 (H) 3.6 - 11.0 K/uL   RBC 4.24 3.80 - 5.20 MIL/uL   Hemoglobin 11.3 (L) 12.0 - 16.0 g/dL   HCT 96.034.4 (L) 45.435.0 - 09.847.0 %   MCV 81.1 80.0 - 100.0 fL   MCH 26.7 26.0 - 34.0 pg   MCHC 32.9 32.0 - 36.0 g/dL   RDW 11.915.3 (H) 14.711.5 - 82.914.5 %   Platelets 312 150 - 440 K/uL  Basic metabolic panel     Status: Abnormal   Collection Time: 02/13/16  3:13 AM  Result Value Ref Range   Sodium 137 135 - 145 mmol/L   Potassium 3.4 (L) 3.5 - 5.1 mmol/L   Chloride 103 101 - 111 mmol/L   CO2 27 22 - 32 mmol/L   Glucose, Bld 115 (H) 65 - 99 mg/dL   BUN 14 6 - 20 mg/dL   Creatinine, Ser 5.620.76 0.44 - 1.00 mg/dL   Calcium 8.3 (L) 8.9 - 10.3 mg/dL   GFR calc non Af Amer >60 >60 mL/min   GFR calc Af Amer >60 >60 mL/min   Anion gap 7 5 - 15  Brain natriuretic peptide     Status: None   Collection Time: 02/13/16  3:13 AM  Result Value Ref Range   B Natriuretic Peptide 95.0 0.0 - 100.0 pg/mL  Glucose, capillary     Status: None   Collection Time: 02/13/16  7:36 AM  Result Value Ref Range   Glucose-Capillary 87 65 - 99 mg/dL  Glucose, capillary     Status: Abnormal   Collection Time: 02/13/16 11:26 AM   Result Value Ref Range   Glucose-Capillary 124 (H) 65 - 99 mg/dL    Dg Chest 1 View  Result Date: 02/13/2016 CLINICAL DATA:  Shortness of breath.  COPD. EXAM: CHEST 1 VIEW FINDINGS: The patient has slight interstitial pulmonary edema with Kerley B-lines at the right lung base. Tiny bilateral pleural effusions superimposed on COPD. Ill-defined area of of lung consolidation at the right lung base posterior medially. This has increased since the prior study consistent with progressive pneumonia. Heart size and pulmonary vascularity appear normal. Aortic atherosclerosis. IMPRESSION: Interstitial pulmonary edema with tiny bilateral effusions, superimposed on COPD. Probable progressing infiltrate at the right lung base consistent with pneumonia. Electronically Signed   By: Francene BoyersJames  Maxwell M.D.   On: 02/13/2016 11:11   Dg Chest 1 View  Result Date: 02/11/2016 CLINICAL DATA:  Fall with left hip fracture, preop. EXAM: CHEST 1 VIEW COMPARISON:  Most recent  radiograph 06/17/2015. Most recent CT 06/07/2015 FINDINGS: Improved lung base aeration from prior radiographs, with residual or recurrent streaky opacity at the right lung base. Mild hyperinflation and emphysema. Left upper lobe nodule tentatively identified. Normal heart size and mediastinal contours. There is atherosclerosis of the aortic arch. No pulmonary edema, large pleural effusion or pneumothorax. Skin fold projects over the left lung base. Remote right rib fractures. IMPRESSION: 1. Streaky right lung base opacity may be chronic scarring, however recurrent atelectasis, aspiration, or pneumonia not entirely excluded. 2. Emphysema.  Thoracic aortic atherosclerosis. 3. Left upper lobe nodule tentatively identified, as seen on prior chest CT. Recommend correlation with any interval imaging/intervention performed elsewhere. In the absence of interval imaging, recommend follow-up chest CT on a nonemergent basis for nodule re-evaluation. Electronically Signed    By: Rubye Oaks M.D.   On: 02/11/2016 18:59   Ct Hip Left Wo Contrast  Result Date: 02/11/2016 CLINICAL DATA:  Left hip pain and fracture after fall. Pre-surgical planning. EXAM: CT OF THE LEFT HIP WITHOUT CONTRAST; 3-DIMENSIONAL CT IMAGE RENDERING ON ACQUISITION WORKSTATION TECHNIQUE: Multidetector CT imaging of the left hip was performed according to the standard protocol. Multiplanar CT image reconstructions were also generated. 3D reformats were provided. COMPARISON:  Radiographs earlier this day. FINDINGS: Bones/Joint/Cartilage Impacted subcapital femoral neck fracture with mild apex anterior angulation. No articular or intertrochanteric component. Acetabulum and pubic rami are intact. Ligaments Suboptimally assessed by CT. Muscles and Tendons No fatty infiltration of the included musculature. Soft tissues Expected soft tissue stranding. Foley catheter in place. Mild vascular calcifications. 3D reformats confirm the above findings. IMPRESSION: Impacted left femoral neck fracture with mild angulation. Electronically Signed   By: Rubye Oaks M.D.   On: 02/11/2016 23:08   Ct 3d Recon At Scanner  Result Date: 02/11/2016 CLINICAL DATA:  Left hip pain and fracture after fall. Pre-surgical planning. EXAM: CT OF THE LEFT HIP WITHOUT CONTRAST; 3-DIMENSIONAL CT IMAGE RENDERING ON ACQUISITION WORKSTATION TECHNIQUE: Multidetector CT imaging of the left hip was performed according to the standard protocol. Multiplanar CT image reconstructions were also generated. 3D reformats were provided. COMPARISON:  Radiographs earlier this day. FINDINGS: Bones/Joint/Cartilage Impacted subcapital femoral neck fracture with mild apex anterior angulation. No articular or intertrochanteric component. Acetabulum and pubic rami are intact. Ligaments Suboptimally assessed by CT. Muscles and Tendons No fatty infiltration of the included musculature. Soft tissues Expected soft tissue stranding. Foley catheter in place. Mild  vascular calcifications. 3D reformats confirm the above findings. IMPRESSION: Impacted left femoral neck fracture with mild angulation. Electronically Signed   By: Rubye Oaks M.D.   On: 02/11/2016 23:08   Dg Hip Port Unilat With Pelvis 1v Left  Result Date: 02/12/2016 CLINICAL DATA:  Left hip replacement. EXAM: DG HIP (WITH OR WITHOUT PELVIS) 1V PORT LEFT COMPARISON:  02/11/2016 FINDINGS: Left hip hemiarthroplasty in satisfactory position and alignment. No fracture or immediate complication. IMPRESSION: Satisfactory left hip hemiarthroplasty. Electronically Signed   By: Marlan Palau M.D.   On: 02/12/2016 14:18   Dg Hip Unilat With Pelvis 2-3 Views Left  Result Date: 02/11/2016 CLINICAL DATA:  Left hip pain after fall from standing position. Left leg shortening and rotation. EXAM: DG HIP (WITH OR WITHOUT PELVIS) 2-3V LEFT COMPARISON:  None. FINDINGS: Impacted fracture of the left subcapital femoral neck. No additional fracture of the bony pelvis. Pubic symphysis and sacroiliac joints are congruent. The bones appear under mineralized. IMPRESSION: Impacted left subcapital femoral neck fracture. Electronically Signed   By: Lujean Rave.D.  On: 02/11/2016 18:54    Assessment/Plan: 1 Day Post-Op   Principal Problem:   Closed left hip fracture (HCC) Active Problems:   COPD (chronic obstructive pulmonary disease) (HCC)   HTN (hypertension)   Dementia  Patient will continue PT.  Hemoglobin and hematocrit are stable today.  +hypokalemia and is to be repleted.  Doing well from orthopaedic standpoint.  Patient is WBAT on left lower extremity.      Leah [name], Loralyn Rachel , MD 02/13/2016, 1:35 PM

## 2016-02-13 NOTE — Progress Notes (Signed)
Patient BP 97/3, manual. Patient non symptomatic, sitting up and eating breakfast. Patient alert and oriented to self (baseline0. MD ordered 500cc bolus and tramadol for pain.  Harvie HeckMelanie Kadeen Sroka, RN

## 2016-02-13 NOTE — Progress Notes (Signed)
Physical Therapy Treatment Patient Details Name: Leah [name] MRN: [id number] DOB: 27-Nov-1932 Today's Date: 02/13/2016    History of Present Illness Pt is an 82 y.o female s/p fall onto L hip sustaining impacted L subcapital femoral neck fx with displacement.  Pt s/p L hip hemiarthroplasty 02/12/16 with posterolateral approach.  PMH includes dementia, COPD, htn, acute on chronic respiratory failure with hypoxia and hypercapnia.    PT Comments    Pt's BP improved this afternoon to 117/46.  Pt able to get OOB with 2 assist and then transfer to commode and to bedside chair with min assist x2 and use of RW.  BP checked by NT after pt was in chair and was 105/39.  Pt limited in WB'ing through L LE d/t L hip pain and required assist to maintain posterior hip precautions.  Will continue to progress pt with strengthening and progressive ambulation per pt tolerance.   Follow Up Recommendations  SNF     Equipment Recommendations  Rolling walker with 5" wheels    Recommendations for Other Services       Precautions / Restrictions Precautions Precautions: Posterior Hip;Fall Restrictions Weight Bearing Restrictions: Yes LLE Weight Bearing: Weight bearing as tolerated    Mobility  Bed Mobility Overal bed mobility: Needs Assistance Bed Mobility: Supine to Sit Rolling: Max assist (logrolling to L to place bed pan beginning of session with nursing assist to place bed pan)   Supine to sit: Min assist;Mod assist;+2 for physical assistance     General bed mobility comments: Assist for trunk and B LE's  Transfers Overall transfer level: Needs assistance Equipment used: Rolling walker (2 wheeled) Transfers: Sit to/from [employer] CorporationStand;Stand Pivot Transfers Sit to Stand: Min assist;+2 physical assistance         General transfer comment: stand from bed and from bedside commode with RW (decreased WB'ing noted with L LE); pt able to take a couple steps and pivot on R LE with cueing and  assist  Ambulation/Gait             General Gait Details: deferred d/t pain with L LE WB'ing   Stairs            Wheelchair Mobility    Modified Rankin (Stroke Patients Only)       Balance Overall balance assessment: Needs assistance Sitting-balance support: Bilateral upper extremity supported;Feet supported Sitting balance-[name]: Poor Sitting balance - Comments: Pt initially mod assist for sitting balance d/t posterior lean but improved to SBA with cueing and encouragement Postural control: Posterior lean Standing balance support: Bilateral upper extremity supported (on RW) Standing balance-[name]: Fair Standing balance comment: static standing                    Cognition Arousal/Alertness: Awake/alert Behavior During Therapy: WFL for tasks assessed/performed Overall Cognitive Status: No family/caregiver present to determine baseline cognitive functioning                      Exercises      General Comments General comments (skin integrity, edema, etc.): Nursing present entire session.  Pt reporting needing to urinate (unable in bed pan) and pt transferred to commode to facilitate success.      Pertinent Vitals/Pain Pain Assessment: Faces Faces Pain Scale: Hurts even more Pain Location: L hip with movement/WB'ing Pain Descriptors / Indicators: Aching;Sore;Operative site guarding Pain Intervention(s): Limited activity within patient's tolerance;Monitored during session;Premedicated before session (NT bringing pt new ice for ice pack)  Home Living                      Prior Function            PT Goals (current goals can now be found in the care plan section) Acute Rehab PT Goals Patient Stated Goal: to have less pain PT Goal Formulation: With patient Time For Goal Achievement: 02/27/16 Potential to Achieve Goals: Fair Progress towards PT goals: Progressing toward goals    Frequency    BID      PT  Plan Current plan remains appropriate    Co-evaluation             End of Session Equipment Utilized During Treatment: Oxygen;Gait belt (2 L O2) Activity Tolerance: Patient limited by pain Patient left: in chair;with call bell/phone within reach;with chair alarm set;with nursing/sitter in room;with SCD's reapplied (pillows placed between pt's knees and to elevated B heels)     Time: 1547-1610 PT Time Calculation (min) (ACUTE ONLY): 23 min  Charges:   $Therapeutic Activity: 23-37 mins                    G CodesHendricks Limes:      Amarys Sliwinski 02/13/2016, 4:26 PM Hendricks LimesEmily Allisson Schindel, PT [phone]

## 2016-02-13 NOTE — Progress Notes (Signed)
[employer] [facility]                                                                                                                                                                                  Patient Demographics   Leah [name], is a 82 y.o. female, DOB - January 10, 1933, ZOX:[id number]  Admit date - 02/11/2016   Admitting Physician Oralia Manisavid Willis, MD  Outpatient Primary MD for the patient is Jaclyn ShaggyATE,DENNY C, MD   LOS - 2  Subjective: Called by nurse due to patient having low blood pressure, also patient have requiring oxygenation. Had a fever last night. She is asymptomatic and denying any complaints.    Review of Systems:   CONSTITUTIONAL:Limited due to dementia  No documented fever. No fatigue, weakness. No weight gain, no weight loss.  EYES: No blurry or double vision.  ENT: No tinnitus. No postnasal drip. No redness of the oropharynx.  RESPIRATORY: No cough, no wheeze, no hemoptysis. No dyspnea.  CARDIOVASCULAR: No chest pain. No orthopnea. No palpitations. No syncope.  GASTROINTESTINAL: No nausea, no vomiting or diarrhea. No abdominal pain. No melena or hematochezia.  GENITOURINARY: No dysuria or hematuria.  ENDOCRINE: No polyuria or nocturia. No heat or cold intolerance.  HEMATOLOGY: No anemia. No bruising. No bleeding.  INTEGUMENTARY: No rashes. No lesions.  MUSCULOSKELETAL: Pain in the left hip No swelling. No gout.  NEUROLOGIC: No numbness, tingling, or ataxia. No seizure-type activity.  PSYCHIATRIC: No anxiety. No insomnia. No ADD.    Vitals:   Vitals:   02/13/16 1023 02/13/16 1158 02/13/16 1242 02/13/16 1330  BP: (!) 88/30 (!) 85/31 (!) 88/32 (!) 117/46  Pulse: 68 71    Resp:      Temp:      TempSrc:      SpO2: 92% 92%    Weight:      Height:        Wt Readings from Last 3 Encounters:  02/11/16 122 lb 9.6 oz (55.6 kg)  06/18/15 125 lb (56.7 kg)     Intake/Output Summary (Last 24 hours) at 02/13/16 1456 Last data filed at  02/13/16 1200  Gross per 24 hour  Intake             2135 ml  Output              300 ml  Net             1835 ml    Physical Exam:   GENERAL: Pleasant-appearing in no apparent distress.  HEAD, EYES, EARS, NOSE AND THROAT: Atraumatic, normocephalic. Extraocular muscles are intact. Pupils equal and reactive to light. Sclerae anicteric. No conjunctival injection. No  oro-pharyngeal erythema.  NECK: Supple. There is no jugular venous distention. No bruits, no lymphadenopathy, no thyromegaly.  HEART: Regular rate and rhythm,. No murmurs, no rubs, no clicks.  LUNGS: Bilateral crackles at the bases no sensory muscle usage ABDOMEN: Soft, flat, nontender, nondistended. Has good bowel sounds. No hepatosplenomegaly appreciated.  EXTREMITIES: No evidence of any cyanosis, clubbing, or peripheral edema.  +2 pedal and radial pulses bilaterally.  NEUROLOGIC: The patient is alert, awake, and oriented x2with no focal motor or sensory deficits appreciated bilaterally.  SKIN: Moist and warm with no rashes appreciated.  Psych: Not anxious, depressed LN: No inguinal LN enlargement    Antibiotics   Anti-infectives    Start     Dose/Rate Route Frequency Ordered Stop   02/13/16 1300  levofloxacin (LEVAQUIN) IVPB 500 mg     500 mg 100 mL/hr over 60 Minutes Intravenous Every 24 hours 02/13/16 1151     02/13/16 1000  cefTRIAXone (ROCEPHIN) IVPB 1 g  Status:  Discontinued     1 g 100 mL/hr over 30 Minutes Intravenous Every 24 hours 02/13/16 0949 02/13/16 1150   02/13/16 0945  cefTRIAXone (ROCEPHIN) 1 g in dextrose 5 % 50 mL IVPB  Status:  Discontinued     1 g 100 mL/hr over 30 Minutes Intravenous Every 24 hours 02/13/16 0944 02/13/16 0949   02/12/16 1700  ceFAZolin (ANCEF) IVPB 2g/100 mL premix     2 g 200 mL/hr over 30 Minutes Intravenous Every 6 hours 02/12/16 1435 02/12/16 2235   02/12/16 0955  ceFAZolin (ANCEF) 2-4 GM/100ML-% IVPB    Comments:  Slemenda, Debbie: cabinet override      02/12/16 0955  02/12/16 1119   02/11/16 2208  ceFAZolin (ANCEF) IVPB 2g/100 mL premix     2 g 200 mL/hr over 30 Minutes Intravenous 30 min pre-op 02/11/16 2208 02/12/16 1119      Medications   Scheduled Meds: . docusate sodium  100 mg Oral BID  . enoxaparin (LOVENOX) injection  40 mg Subcutaneous Q24H  . ferrous sulfate  325 mg Oral TID PC  . levofloxacin (LEVAQUIN) IV  500 mg Intravenous Q24H  . midodrine  5 mg Oral TID WC  . potassium chloride  40 mEq Oral Once  . senna  1 tablet Oral BID   Continuous Infusions: . sodium chloride 75 mL/hr (02/13/16 0208)   PRN Meds:.acetaminophen **OR** acetaminophen, alum & mag hydroxide-simeth, bisacodyl, ipratropium-albuterol, LORazepam, magnesium citrate, menthol-cetylpyridinium **OR** phenol, meperidine (DEMEROL) injection, methocarbamol **OR** methocarbamol (ROBAXIN)  IV, morphine injection, ondansetron **OR** ondansetron (ZOFRAN) IV, oxyCODONE, polyethylene glycol, promethazine, traMADol   Data Review:   Micro Results Recent Results (from the past 240 hour(s))  Urine culture     Status: Abnormal (Preliminary result)   Collection Time: 02/11/16  6:51 PM  Result Value Ref Range Status   Specimen Description URINE, RANDOM  Final   Special Requests NONE  Final   Culture (A)  Final    40,000 COLONIES/mL ENTEROBACTER SPECIES SUSCEPTIBILITIES TO FOLLOW Performed at [facility]    Report Status PENDING  Incomplete  Surgical pcr screen     Status: Abnormal   Collection Time: 02/12/16 12:45 AM  Result Value Ref Range Status   MRSA, PCR NEGATIVE NEGATIVE Final   Staphylococcus aureus POSITIVE (A) NEGATIVE Final    Comment:        The Xpert SA Assay (FDA approved for NASAL specimens in patients over 80 years of age), is one component of a comprehensive surveillance program.  Test  performance has been validated by [facility] for patients greater than or equal to 6 year old. It is not intended to diagnose infection nor to guide or monitor  treatment.     Radiology Reports Dg Chest 1 View  Result Date: 02/13/2016 CLINICAL DATA:  Shortness of breath.  COPD. EXAM: CHEST 1 VIEW FINDINGS: The patient has slight interstitial pulmonary edema with Kerley B-lines at the right lung base. Tiny bilateral pleural effusions superimposed on COPD. Ill-defined area of of lung consolidation at the right lung base posterior medially. This has increased since the prior study consistent with progressive pneumonia. Heart size and pulmonary vascularity appear normal. Aortic atherosclerosis. IMPRESSION: Interstitial pulmonary edema with tiny bilateral effusions, superimposed on COPD. Probable progressing infiltrate at the right lung base consistent with pneumonia. Electronically Signed   By: Francene Boyers M.D.   On: 02/13/2016 11:11   Dg Chest 1 View  Result Date: 02/11/2016 CLINICAL DATA:  Fall with left hip fracture, preop. EXAM: CHEST 1 VIEW COMPARISON:  Most recent radiograph 06/17/2015. Most recent CT 06/07/2015 FINDINGS: Improved lung base aeration from prior radiographs, with residual or recurrent streaky opacity at the right lung base. Mild hyperinflation and emphysema. Left upper lobe nodule tentatively identified. Normal heart size and mediastinal contours. There is atherosclerosis of the aortic arch. No pulmonary edema, large pleural effusion or pneumothorax. Skin fold projects over the left lung base. Remote right rib fractures. IMPRESSION: 1. Streaky right lung base opacity may be chronic scarring, however recurrent atelectasis, aspiration, or pneumonia not entirely excluded. 2. Emphysema.  Thoracic aortic atherosclerosis. 3. Left upper lobe nodule tentatively identified, as seen on prior chest CT. Recommend correlation with any interval imaging/intervention performed elsewhere. In the absence of interval imaging, recommend follow-up chest CT on a nonemergent basis for nodule re-evaluation. Electronically Signed   By: Rubye Oaks M.D.   On:  02/11/2016 18:59   Ct Hip Left Wo Contrast  Result Date: 02/11/2016 CLINICAL DATA:  Left hip pain and fracture after fall. Pre-surgical planning. EXAM: CT OF THE LEFT HIP WITHOUT CONTRAST; 3-DIMENSIONAL CT IMAGE RENDERING ON ACQUISITION WORKSTATION TECHNIQUE: Multidetector CT imaging of the left hip was performed according to the standard protocol. Multiplanar CT image reconstructions were also generated. 3D reformats were provided. COMPARISON:  Radiographs earlier this day. FINDINGS: Bones/Joint/Cartilage Impacted subcapital femoral neck fracture with mild apex anterior angulation. No articular or intertrochanteric component. Acetabulum and pubic rami are intact. Ligaments Suboptimally assessed by CT. Muscles and Tendons No fatty infiltration of the included musculature. Soft tissues Expected soft tissue stranding. Foley catheter in place. Mild vascular calcifications. 3D reformats confirm the above findings. IMPRESSION: Impacted left femoral neck fracture with mild angulation. Electronically Signed   By: Rubye Oaks M.D.   On: 02/11/2016 23:08   Ct 3d Recon At Scanner  Result Date: 02/11/2016 CLINICAL DATA:  Left hip pain and fracture after fall. Pre-surgical planning. EXAM: CT OF THE LEFT HIP WITHOUT CONTRAST; 3-DIMENSIONAL CT IMAGE RENDERING ON ACQUISITION WORKSTATION TECHNIQUE: Multidetector CT imaging of the left hip was performed according to the standard protocol. Multiplanar CT image reconstructions were also generated. 3D reformats were provided. COMPARISON:  Radiographs earlier this day. FINDINGS: Bones/Joint/Cartilage Impacted subcapital femoral neck fracture with mild apex anterior angulation. No articular or intertrochanteric component. Acetabulum and pubic rami are intact. Ligaments Suboptimally assessed by CT. Muscles and Tendons No fatty infiltration of the included musculature. Soft tissues Expected soft tissue stranding. Foley catheter in place. Mild vascular calcifications. 3D  reformats confirm the above  findings. IMPRESSION: Impacted left femoral neck fracture with mild angulation. Electronically Signed   By: Rubye Oaks M.D.   On: 02/11/2016 23:08   Dg Hip Port Unilat With Pelvis 1v Left  Result Date: 02/12/2016 CLINICAL DATA:  Left hip replacement. EXAM: DG HIP (WITH OR WITHOUT PELVIS) 1V PORT LEFT COMPARISON:  02/11/2016 FINDINGS: Left hip hemiarthroplasty in satisfactory position and alignment. No fracture or immediate complication. IMPRESSION: Satisfactory left hip hemiarthroplasty. Electronically Signed   By: Marlan Palau M.D.   On: 02/12/2016 14:18   Dg Hip Unilat With Pelvis 2-3 Views Left  Result Date: 02/11/2016 CLINICAL DATA:  Left hip pain after fall from standing position. Left leg shortening and rotation. EXAM: DG HIP (WITH OR WITHOUT PELVIS) 2-3V LEFT COMPARISON:  None. FINDINGS: Impacted fracture of the left subcapital femoral neck. No additional fracture of the bony pelvis. Pubic symphysis and sacroiliac joints are congruent. The bones appear under mineralized. IMPRESSION: Impacted left subcapital femoral neck fracture. Electronically Signed   By: Rubye Oaks M.D.   On: 02/11/2016 18:54     CBC  Recent Labs Lab 02/11/16 1823 02/12/16 0003 02/12/16 0408 02/13/16 0313  WBC 14.4* 20.7* 17.6* 20.3*  HGB 13.4 13.4 12.5 11.3*  HCT 40.5 41.4 38.0 34.4*  PLT 383 404 365 312  MCV 81.7 81.1 81.9 81.1  MCH 27.0 26.3 26.9 26.7  MCHC 33.1 32.4 32.8 32.9  RDW 15.2* 15.1* 15.1* 15.3*  LYMPHSABS 2.4 1.1  --   --   MONOABS 1.1* 1.4*  --   --   EOSABS 0.4 0.1  --   --   BASOSABS 0.1 0.1  --   --     Chemistries   Recent Labs Lab 02/11/16 1823 02/12/16 0003 02/12/16 0408 02/13/16 0313  NA 139 141 140 137  K 3.6 3.5 3.4* 3.4*  CL 101 106 106 103  CO2 32 28 28 27   GLUCOSE 95 124* 99 115*  BUN 22* 19 16 14   CREATININE 0.91 0.74 0.68 0.76  CALCIUM 9.0 9.1 8.8* 8.3*  AST 18 18  --   --   ALT 12* 13*  --   --   ALKPHOS 95 94  --   --    BILITOT 0.4 0.9  --   --    ------------------------------------------------------------------------------------------------------------------ estimated creatinine clearance is 48.4 mL/min (by C-G formula based on SCr of 0.76 mg/dL). ------------------------------------------------------------------------------------------------------------------ No results for input(s): HGBA1C in the last 72 hours. ------------------------------------------------------------------------------------------------------------------ No results for input(s): CHOL, HDL, LDLCALC, TRIG, CHOLHDL, LDLDIRECT in the last 72 hours. ------------------------------------------------------------------------------------------------------------------ No results for input(s): TSH, T4TOTAL, T3FREE, THYROIDAB in the last 72 hours.  Invalid input(s): FREET3 ------------------------------------------------------------------------------------------------------------------ No results for input(s): VITAMINB12, FOLATE, FERRITIN, TIBC, IRON, RETICCTPCT in the last 72 hours.  Coagulation profile  Recent Labs Lab 02/11/16 1823 02/12/16 0003  INR 1.07 1.08    No results for input(s): DDIMER in the last 72 hours.  Cardiac Enzymes No results for input(s): CKMB, TROPONINI, MYOGLOBIN in the last 168 hours.  Invalid input(s): CK ------------------------------------------------------------------------------------------------------------------ Invalid input(s): POCBNP    Assessment & Plan  Patient is 82 year old status post left hip fracture 1.    COPD (chronic obstructive pulmonary disease) (HCC) no evidence of exasperation Continue when necessary nebulizer  2     acute hypoxia suspect this is related to aspiration pneumonia and possible CHF based on her repeat chest x-ray today I will treat her with IV Levaquin. Due to hypotension unable to give Lasix will obtain echocardiogram of the heart  3. Hypotension patient has been  given IV fluids will start her on midrodrine,  4.  Dementia  continue therapy with lorazepam as needed 5. Leukocytosis due to pna, IV levaquine follow cbc 5. Miscellaneous recommend Lovenox for DVT prophylaxis      Code Status Orders        Start     Ordered   02/11/16 2316  Do not attempt resuscitation (DNR)  Continuous    Question Answer Comment  In the event of cardiac or respiratory ARREST Do not call a "code blue"   In the event of cardiac or respiratory ARREST Do not perform Intubation, CPR, defibrillation or ACLS   In the event of cardiac or respiratory ARREST Use medication by any route, position, wound care, and other measures to relive pain and suffering. May use oxygen, suction and manual treatment of airway obstruction as needed for comfort.      02/11/16 2316    Code Status History    Date Active Date Inactive Code Status Order ID Comments User Context   02/11/2016 10:06 PM 02/11/2016 10:07 PM Full Code [id number]  Juanell Fairly, MD ED   06/20/2015  4:53 PM 06/21/2015  8:10 PM DNR [id number]  Suan Halter, MD Inpatient   06/19/2015  5:29 PM 06/20/2015  4:53 PM DNR [id number]  Suan Halter, MD Inpatient   06/12/2015 10:26 AM 06/19/2015  5:29 PM DNR [id number]  Erin Fulling, MD Inpatient   06/08/2015  2:51 AM 06/12/2015 10:26 AM Full Code [id number]  Houston Siren, MD ED           Consults  Orthopedics   DVT Prophylaxis  Lovenox  Lab Results  Component Value Date   PLT 312 02/13/2016     Time Spent in minutes   Greater than 50% of time spent in care coordination and counseling patient regarding the condition and plan of care.   Auburn Bilberry M.D on 02/13/2016 at 2:56 PM  Between 7am to 6pm - Pager - [phone]  After 6pm go to [email or web address] - password [email or web address] [facility]  [facility]   Office  [phone]

## 2016-02-13 NOTE — Progress Notes (Signed)
Fever still present. MD notified. Orders received

## 2016-02-13 NOTE — Progress Notes (Signed)
Patient BP 88/30. MD aware. Ordered 500cc bolus and medication.  Harvie HeckMelanie Vishruth Seoane, RN

## 2016-02-14 LAB — CBC WITH DIFFERENTIAL/PLATELET
Basophils Absolute: 0 10*3/uL (ref 0–0.1)
Basophils Relative: 0 %
EOS ABS: 0.3 10*3/uL (ref 0–0.7)
EOS PCT: 1 %
HCT: 30.9 % — ABNORMAL LOW (ref 35.0–47.0)
HEMOGLOBIN: 10 g/dL — AB (ref 12.0–16.0)
LYMPHS ABS: 1.3 10*3/uL (ref 1.0–3.6)
LYMPHS PCT: 6 %
MCH: 26.7 pg (ref 26.0–34.0)
MCHC: 32.4 g/dL (ref 32.0–36.0)
MCV: 82.3 fL (ref 80.0–100.0)
MONOS PCT: 8 %
Monocytes Absolute: 1.8 10*3/uL — ABNORMAL HIGH (ref 0.2–0.9)
NEUTROS PCT: 85 %
Neutro Abs: 20.4 10*3/uL — ABNORMAL HIGH (ref 1.4–6.5)
Platelets: 279 10*3/uL (ref 150–440)
RBC: 3.75 MIL/uL — AB (ref 3.80–5.20)
RDW: 15.2 % — ABNORMAL HIGH (ref 11.5–14.5)
WBC: 23.9 10*3/uL — AB (ref 3.6–11.0)

## 2016-02-14 LAB — BASIC METABOLIC PANEL
Anion gap: 4 — ABNORMAL LOW (ref 5–15)
BUN: 14 mg/dL (ref 6–20)
CHLORIDE: 109 mmol/L (ref 101–111)
CO2: 27 mmol/L (ref 22–32)
CREATININE: 0.7 mg/dL (ref 0.44–1.00)
Calcium: 8.6 mg/dL — ABNORMAL LOW (ref 8.9–10.3)
GFR calc Af Amer: >60 mL/min (ref >60)
GFR calc non Af Amer: >60 mL/min (ref >60)
GLUCOSE: 103 mg/dL — AB (ref 65–99)
POTASSIUM: 3.2 mmol/L — AB (ref 3.5–5.1)
Sodium: 140 mmol/L (ref 135–145)

## 2016-02-14 LAB — URINE CULTURE

## 2016-02-14 LAB — SURGICAL PATHOLOGY

## 2016-02-14 MED ORDER — MIDODRINE HCL 5 MG PO TABS
10.0000 mg | ORAL_TABLET | Freq: Three times a day (TID) | ORAL | Status: DC
  Administered 2016-02-14 – 2016-02-16 (×6): 10 mg via ORAL
  Filled 2016-02-14 (×6): qty 2

## 2016-02-14 MED ORDER — MEROPENEM-SODIUM CHLORIDE 1 GM/50ML IV SOLR
1.0000 g | Freq: Two times a day (BID) | INTRAVENOUS | Status: DC
  Administered 2016-02-14 – 2016-02-16 (×4): 1 g via INTRAVENOUS
  Filled 2016-02-14 (×5): qty 50

## 2016-02-14 MED ORDER — POTASSIUM CHLORIDE CRYS ER 20 MEQ PO TBCR
40.0000 meq | EXTENDED_RELEASE_TABLET | ORAL | Status: DC
  Administered 2016-02-14: 40 meq via ORAL
  Filled 2016-02-14: qty 2

## 2016-02-14 MED ORDER — POTASSIUM CHLORIDE 20 MEQ PO PACK
40.0000 meq | PACK | Freq: Once | ORAL | Status: AC
  Administered 2016-02-14: 40 meq via ORAL

## 2016-02-14 NOTE — Evaluation (Signed)
Occupational Therapy Evaluation Patient Details Name: Leah [name] MRN: [id number] DOB: 08-03-1932 Today's Date: 02/14/2016    History of Present Illness Pt is an 82 y.o female s/p fall onto L hip sustaining impacted L subcapital femoral neck fx with displacement.  Pt s/p L hip hemiarthroplasty 02/12/16 with posterolateral approach.  PMH includes dementia, COPD, htn, acute on chronic respiratory failure with hypoxia and hypercapnia.   Clinical Impression   Pt is 82 year old female s/p L hip hemiarthroplasty 02/12/16 with posterolateral approach and now with posterior hip precautions.  She was at long term care section of [facility] prior to admission and not clear on PLOF.  She has hx of demential and ability to follow instructions for use of AD is limited.  In addition, she does not recall having hip surgery nor having posterior hip precautions.  Pt is limited in functional ADLs due to pain, decreased ROM, and decreased cognitive status.  Pt requires min assist for self feeding, grooming and UB dressing and total assist for LB dressing and bathing skills due to pain and decreased AROM of L LE.  Rec a 1 week trial of OT to see if she can make progress with ADLs using any AD or if therapy will focus on caregiver ed and training. Rec SNF after discharge, back to [facility].      Follow Up Recommendations  SNF    Equipment Recommendations       Recommendations for Other Services       Precautions / Restrictions Precautions Precautions: Posterior Hip;Fall Restrictions Weight Bearing Restrictions: Yes LLE Weight Bearing: Weight bearing as tolerated      Mobility Bed Mobility Overal bed mobility: Needs Assistance Bed Mobility: Supine to Sit Rolling: Max assist         General bed mobility comments: Assist for trunk and B LE's; vc's for technique  Transfers Overall transfer level: Needs assistance Equipment used: Rolling walker (2 wheeled) Transfers: Sit to/from  Stand Sit to Stand: Min assist;+2 physical assistance         General transfer comment: vc's for hand and feet placement; assist to initiate stand    Balance Overall balance assessment: Needs assistance Sitting-balance support: Bilateral upper extremity supported;Feet supported Sitting balance-[name]: Poor Sitting balance - Comments: Pt initially mod assist for sitting balance d/t posterior lean but improved to SBA with cueing and encouragement Postural control: Posterior lean Standing balance support: Bilateral upper extremity supported   Standing balance comment: static standing                            ADL Overall ADL's : Needs assistance/impaired Eating/Feeding: Set up;Minimal assistance Eating/Feeding Details (indicate cue type and reason): min assist to set up tray but pt with minimal appetite and perseverating on [place] stuck in between teeth Grooming: Wash/dry hands;Wash/dry face;Oral care;Brushing hair;Set up;Minimal assistance Grooming Details (indicate cue type and reason): min assist and mod cues to initiate task and easily distracted and looks out window unless cued to stay on task         Upper Body Dressing : Minimal assistance;Set up;Bed level   Lower Body Dressing: Total assistance Lower Body Dressing Details (indicate cue type and reason): Pt does not recall that she had hip surgery and does not know post hip precautions. Unable to follow instruction for use of AD due to hx of dementia.  General ADL Comments: Updated NSG that pt was coughing up thick, white phlegm after brusthing teeth and has a congested, rattling cough.       Vision     Perception     Praxis      Pertinent Vitals/Pain Pain Assessment: Faces Faces Pain Scale: Hurts even more Pain Location: L hip with movement/WB'ing Pain Descriptors / Indicators: Aching;Sore;Operative site guarding Pain Intervention(s): Limited activity within patient's  tolerance;Monitored during session;Premedicated before session     Hand Dominance Right   Extremity/Trunk Assessment Upper Extremity Assessment Upper Extremity Assessment: Generalized weakness   Lower Extremity Assessment Lower Extremity Assessment: Defer to PT evaluation       Communication Communication Communication: HOH   Cognition Arousal/Alertness: Awake/alert Behavior During Therapy: WFL for tasks assessed/performed Overall Cognitive Status: No family/caregiver present to determine baseline cognitive functioning                 General Comments: from [facility]   General Comments       Exercises       Shoulder Instructions      Home Living Family/patient expects to be discharged to:: Skilled nursing facility                                 Additional Comments: [facility]      Prior Functioning/Environment Level of Independence: Needs assistance        Comments: Pt not able to answer questions to determine PLOF.  Per notes pt uses O2 PRN.        OT Problem List: Decreased knowledge of precautions;Decreased activity tolerance;Pain;Decreased strength;Decreased range of motion;Impaired balance (sitting and/or standing)   OT Treatment/Interventions: Patient/family education;Self-care/ADL training;DME and/or AE instruction (rec a trial of 1 week to see if pt can make any progress with ADLs using AD)    OT Goals(Current goals can be found in the care plan section) Acute Rehab OT Goals Patient Stated Goal: to get meat out of my teeth OT Goal Formulation: With patient Time For Goal Achievement: 02/28/16 Potential to Achieve Goals: Poor ADL Goals Pt Will Perform Lower Body Dressing: with max assist;with caregiver independent in assisting;sit to/from stand (pt unable to follow instructions for AD or recall post hip ) Pt Will Transfer to Toilet: stand pivot transfer;bedside commode;with mod assist  (within hip precautions)  OT Frequency: Min 1X/week   Barriers to D/C:            Co-evaluation              End of Session Nurse Communication: Other (comment) (pt coughing up thick white phlegm twice)  Activity Tolerance: Patient tolerated treatment well Patient left: in bed;with call bell/phone within reach;with bed alarm set   Time: 1345-1425 OT Time Calculation (min): 40 min Charges:  OT General Charges $OT Visit: 1 Procedure OT Evaluation $OT Eval Moderate Complexity: 1 Procedure OT Treatments $Self Care/Home Management : 23-37 mins G-Codes:    Susanne BordersSusan Wofford, OTR/L ascom [phone] 02/14/16, 2:39 PM

## 2016-02-14 NOTE — Progress Notes (Signed)
Physical Therapy Treatment Patient Details Name: Leah [name] MRN: [id number] DOB: [birth year] Today's Date: 02/14/2016    History of Present Illness Pt is an 82 y.o female s/p fall onto L hip sustaining impacted L subcapital femoral neck fx with displacement.  Pt s/p L hip hemiarthroplasty 02/12/16 with posterolateral approach.  PMH includes dementia, COPD, htn, acute on chronic respiratory failure with hypoxia and hypercapnia.    PT Comments    Pt appearing fatigued laying in bed so deferred OOB mobility and pt agreeable to in bed ex's.  Pt demonstrating improved tolerance to L LE ex's this session (in regards to pain) and able to increase repetitions.  Pt provided with pacing d/t fatigue though.  Will continue to progress pt with LE strengthening and progressive functional mobility per pt tolerance.   Follow Up Recommendations  SNF     Equipment Recommendations  Rolling walker with 5" wheels    Recommendations for Other Services       Precautions / Restrictions Precautions Precautions: Posterior Hip;Fall Restrictions Weight Bearing Restrictions: Yes LLE Weight Bearing: Weight bearing as tolerated    Mobility  Bed Mobility         General bed mobility comments: Deferred OOB d/t pt appearing fatigued laying in bed  Transfers          Ambulation/Gait    Stairs            Wheelchair Mobility    Modified Rankin (Stroke Patients Only)       Balance                    Cognition Arousal/Alertness: Awake/alert Behavior During Therapy: WFL for tasks assessed/performed Overall Cognitive Status: No family/caregiver present to determine baseline cognitive functioning                    Exercises Total Joint Exercises Ankle Circles/Pumps: AROM;Strengthening;Both;20 reps;Supine Quad Sets: AROM;Strengthening;Both;20 reps;Supine Short Arc Quad: AROM;Strengthening;Both;20 reps;Supine Heel Slides: AAROM;Strengthening;Both;20 reps;Supine Hip  ABduction/ADduction: AAROM;Strengthening;Both;20 reps;Supine  Pt required vc's and tactile cues for ex's.    General Comments  Pt agreeable to in bed ex's.      Pertinent Vitals/Pain Pain Assessment: Faces Faces Pain Scale: Hurts even more Pain Location: L hip with movement Pain Descriptors / Indicators: Aching;Sore;Operative site guarding Pain Intervention(s): Limited activity within patient's tolerance;Monitored during session;Premedicated before session;Repositioned    Home Living Family/patient expects to be discharged to:: Skilled nursing facility                 Prior Function        PT Goals (current goals can now be found in the care plan section) Acute Rehab PT Goals Patient Stated Goal: to have less L hip pain PT Goal Formulation: With patient Time For Goal Achievement: 02/27/16 Potential to Achieve Goals: Fair Progress towards PT goals: Progressing toward goals    Frequency    BID      PT Plan Current plan remains appropriate    Co-evaluation             End of Session Equipment Utilized During Treatment: Oxygen;Gait belt (2 L O2) Activity Tolerance: Patient limited by pain;Patient limited by fatigue Patient left: in bed;with call bell/phone within reach;with bed alarm set;with SCD's reapplied (hip abduction pillow in place; B heels elevated via towel rolls)     Time: [id number] PT Time Calculation (min) (ACUTE ONLY): 23 min  Charges:   $Therapeutic Exercise: 23-37 mins  G CodesHendricks [name]:      Leah [name] 02/14/2016, 3:45 PM Leah [name], PT [phone]

## 2016-02-14 NOTE — Progress Notes (Signed)
Physical Therapy Treatment Patient Details Name: Leah [name] MRN: [id number] DOB: 09-04-1932 Today's Date: 02/14/2016    History of Present Illness Pt is an 82 y.o female s/p fall onto L hip sustaining impacted L subcapital femoral neck fx with displacement.  Pt s/p L hip hemiarthroplasty 02/12/16 with posterolateral approach.  PMH includes dementia, COPD, htn, acute on chronic respiratory failure with hypoxia and hypercapnia.    PT Comments    Pt able to progress to ambulating 20 feet with RW CGA to min assist x1 plus 2nd assist for IV pole and O2 tank management.  Distance ambulating limited d/t L hip pain.  Pt requiring vc's for gait technique and walker use.  Pt's BP 132/46 beginning of session and 154/49 end of session with O2 92% or greater on 2 L O2 via nasal cannula during session.  Will continue to progress pt with decreasing assist levels with bed mobility, transfers, and increasing distance able to ambulate with RW per pt tolerance.   Follow Up Recommendations  SNF     Equipment Recommendations  Rolling walker with 5" wheels    Recommendations for Other Services       Precautions / Restrictions Precautions Precautions: Posterior Hip;Fall Restrictions Weight Bearing Restrictions: Yes LLE Weight Bearing: Weight bearing as tolerated    Mobility  Bed Mobility Overal bed mobility: Needs Assistance Bed Mobility: Supine to Sit Rolling: Max assist         General bed mobility comments: Assist for trunk and B LE's; vc's for technique  Transfers Overall transfer level: Needs assistance Equipment used: Rolling walker (2 wheeled) Transfers: Sit to/from Stand Sit to Stand: Min assist;+2 physical assistance         General transfer comment: vc's for hand and feet placement; assist to initiate stand  Ambulation/Gait Ambulation/Gait assistance: +2 safety/equipment;Min guard;Min assist Ambulation Distance (Feet): 20 Feet Assistive device: Rolling walker (2  wheeled)   Gait velocity: decreased   General Gait Details: antalgic; decreased stance time L LE; vc's for gait technique and intermittent min assist for walker navigation; assist to maintain L LE post hip precautions with turning   Stairs            Wheelchair Mobility    Modified Rankin (Stroke Patients Only)       Balance Overall balance assessment: Needs assistance Sitting-balance support: Bilateral upper extremity supported;Feet supported Sitting balance-[name]: Poor Sitting balance - Comments: Pt initially mod assist for sitting balance d/t posterior lean but improved to SBA with cueing and encouragement Postural control: Posterior lean Standing balance support: Bilateral upper extremity supported   Standing balance comment: static standing                    Cognition Arousal/Alertness: Awake/alert Behavior During Therapy: WFL for tasks assessed/performed Overall Cognitive Status: No family/caregiver present to determine baseline cognitive functioning                      Exercises Total Joint Exercises Ankle Circles/Pumps: AROM;Strengthening;Both;10 reps;Supine Quad Sets: AROM;Strengthening;Both;Supine Short Arc Quad: AAROM;Strengthening;Both;10 reps;Supine Heel Slides: AAROM;Strengthening;Both;10 reps;Supine Hip ABduction/ADduction: AAROM;Strengthening;Both;10 reps;Supine  Vc's and tactile cues required for correct technique of above ex's.    General Comments  Pt agreeable to PT session.  Nursing present beginning of session.      Pertinent Vitals/Pain Pain Assessment: Faces Faces Pain Scale: Hurts even more Pain Location: L hip with movement/WB'ing Pain Descriptors / Indicators: Aching;Sore;Operative site guarding Pain Intervention(s): Limited activity within patient's  tolerance;Monitored during session;Premedicated before session;Repositioned  HR 74-87 bpm during session.    Home Living                      Prior  Function            PT Goals (current goals can now be found in the care plan section) Acute Rehab PT Goals Patient Stated Goal: to have less pain PT Goal Formulation: With patient Time For Goal Achievement: 02/27/16 Potential to Achieve Goals: Fair Progress towards PT goals: Progressing toward goals    Frequency    BID      PT Plan Current plan remains appropriate    Co-evaluation             End of Session Equipment Utilized During Treatment: Oxygen;Gait belt (2 L O2) Activity Tolerance: Patient limited by pain Patient left: in chair;with call bell/phone within reach;with chair alarm set;with SCD's reapplied (pillows placed between pt's knees for posterior hip precautions and B heels elevated via pillows)     Time: [id number] PT Time Calculation (min) (ACUTE ONLY): 38 min  Charges:  $Gait Training: 8-22 mins $Therapeutic Exercise: 8-22 mins                    G CodesHendricks [name]:      Leah [name] 02/14/2016, 12:23 PM Leah [name], PT [phone]

## 2016-02-14 NOTE — Progress Notes (Signed)
Subjective:  POD #2  S/p left hip hemiarthroplasty.  Patient reports pain as mild.  Patient up out of bed to a chair. She is resting comfortably. Patient appears in no acute distress.  Objective:   VITALS:   Vitals:   02/14/16 0515 02/14/16 0538 02/14/16 0811 02/14/16 1030  BP: (!) 143/54  (!) 83/36 (!) 132/46  Pulse: 80 81 74 74  Resp: 16     Temp: 98.9 F (37.2 C)  98.5 F (36.9 C)   TempSrc: Oral  Oral   SpO2: (!) 84% 92% 93% 92%  Weight:      Height:        PHYSICAL EXAM:  Patient in no acute distress. Left lower extremity:  Neurovascular intact Sensation intact distally Intact pulses distally Dorsiflexion/Plantar flexion intact Incision: dressing C/D/I No cellulitis present Compartment soft  LABS  Results for orders placed or performed during the hospital encounter of 02/11/16 (from the past 24 hour(s))  Glucose, capillary     Status: Abnormal   Collection Time: 02/13/16  4:10 PM  Result Value Ref Range   Glucose-Capillary 119 (H) 65 - 99 mg/dL  Glucose, capillary     Status: Abnormal   Collection Time: 02/13/16  9:16 PM  Result Value Ref Range   Glucose-Capillary 131 (H) 65 - 99 mg/dL  CBC with Differential/Platelet     Status: Abnormal   Collection Time: 02/14/16  9:59 AM  Result Value Ref Range   WBC 23.9 (H) 3.6 - 11.0 K/uL   RBC 3.75 (L) 3.80 - 5.20 MIL/uL   Hemoglobin 10.0 (L) 12.0 - 16.0 g/dL   HCT 16.130.9 (L) 09.635.0 - 04.547.0 %   MCV 82.3 80.0 - 100.0 fL   MCH 26.7 26.0 - 34.0 pg   MCHC 32.4 32.0 - 36.0 g/dL   RDW 40.915.2 (H) 81.111.5 - 91.414.5 %   Platelets 279 150 - 440 K/uL   Neutrophils Relative % 85 %   Neutro Abs 20.4 (H) 1.4 - 6.5 K/uL   Lymphocytes Relative 6 %   Lymphs Abs 1.3 1.0 - 3.6 K/uL   Monocytes Relative 8 %   Monocytes Absolute 1.8 (H) 0.2 - 0.9 K/uL   Eosinophils Relative 1 %   Eosinophils Absolute 0.3 0 - 0.7 K/uL   Basophils Relative 0 %   Basophils Absolute 0.0 0 - 0.1 K/uL  Basic metabolic panel     Status: Abnormal   Collection  Time: 02/14/16  9:59 AM  Result Value Ref Range   Sodium 140 135 - 145 mmol/L   Potassium 3.2 (L) 3.5 - 5.1 mmol/L   Chloride 109 101 - 111 mmol/L   CO2 27 22 - 32 mmol/L   Glucose, Bld 103 (H) 65 - 99 mg/dL   BUN 14 6 - 20 mg/dL   Creatinine, Ser 7.820.70 0.44 - 1.00 mg/dL   Calcium 8.6 (L) 8.9 - 10.3 mg/dL   GFR calc non Af Amer >60 >60 mL/min   GFR calc Af Amer >60 >60 mL/min   Anion gap 4 (L) 5 - 15    Dg Chest 1 View  Result Date: 02/13/2016 CLINICAL DATA:  Shortness of breath.  COPD. EXAM: CHEST 1 VIEW FINDINGS: The patient has slight interstitial pulmonary edema with Kerley B-lines at the right lung base. Tiny bilateral pleural effusions superimposed on COPD. Ill-defined area of of lung consolidation at the right lung base posterior medially. This has increased since the prior study consistent with progressive pneumonia. Heart size and pulmonary vascularity  appear normal. Aortic atherosclerosis. IMPRESSION: Interstitial pulmonary edema with tiny bilateral effusions, superimposed on COPD. Probable progressing infiltrate at the right lung base consistent with pneumonia. Electronically Signed   By: Francene BoyersJames  Maxwell M.D.   On: 02/13/2016 11:11   Dg Hip Port Unilat With Pelvis 1v Left  Result Date: 02/12/2016 CLINICAL DATA:  Left hip replacement. EXAM: DG HIP (WITH OR WITHOUT PELVIS) 1V PORT LEFT COMPARISON:  02/11/2016 FINDINGS: Left hip hemiarthroplasty in satisfactory position and alignment. No fracture or immediate complication. IMPRESSION: Satisfactory left hip hemiarthroplasty. Electronically Signed   By: Marlan Palauharles  Clark M.D.   On: 02/12/2016 14:18    Assessment/Plan: 2 Days Post-Op   Principal Problem:   Closed left hip fracture (HCC) Active Problems:   COPD (chronic obstructive pulmonary disease) (HCC)   HTN (hypertension)   Dementia  Patient doing well on an orthopedic standpoint postop.  She may continue physical therapy as tolerated. She had an episode of hypotension  yesterday. He has had a fever and increased O2 requirement. I'm ordering a speech and swallow evaluation. Patient may have aspirated. ID consult has been ordered by the hospitalist for workup patient's elevated white count.     Leah [name], Leah [name] , MD 02/14/2016, 1:15 PM

## 2016-02-14 NOTE — Progress Notes (Signed)
Pharmacy Antibiotic Note  Shynia I [place] is a 82 y.o. female admitted on 02/11/2016 with now with possible aspiration pneumonia and ?UTI.  Pharmacy has been consulted for meropenem dosing.  Pt with increasing WBC.   Plan: Meropenem 1 g IV q12h based on current renal function.   Height: 5\' 6"  (167.6 cm) Weight: 122 lb 9.6 oz (55.6 kg) IBW/kg (Calculated) : 59.3  Temp (24hrs), Avg:98.5 F (36.9 C), Min:97.8 F (36.6 C), Max:98.9 F (37.2 C)   Recent Labs Lab 02/11/16 1823 02/12/16 0003 02/12/16 0408 02/13/16 0313 02/14/16 0959  WBC 14.4* 20.7* 17.6* 20.3* 23.9*  CREATININE 0.91 0.74 0.68 0.76 0.70    Estimated Creatinine Clearance: 48.4 mL/min (by C-G formula based on SCr of 0.7 mg/dL).    No Known Allergies  Antimicrobials this admission: Ceftriaxone 12/6 >>12/6 Levaquin 12/6 >>12/7 Meropenem 12/7 >>  Dose adjustments this admission:   Microbiology results: UCx: 40k enterobacter spp - per microlab I to levaquin, S to imipenem and ertapenem as well MRSA PCR: neg, SA +  Thank you for allowing pharmacy to be a part of this patient's care.  Marty HeckWang, Notnamed Croucher L 02/14/2016 3:34 PM

## 2016-02-14 NOTE — Progress Notes (Signed)
[facility] if medically [facility]. Tiffany admissions coordinator [facility] is aware of above. CSW contacted patient's daughter in law Zella BallRobin and made her aware of above. CSW will continue to follow and assist as needed.   Baker Hughes IncorporatedBailey Lucas Winograd, LCSW [phone]

## 2016-02-14 NOTE — Progress Notes (Signed)
[employer] - [place] at [facility]                                                                                                                                                                                  Patient Demographics   Leah [name], is a 82 y.o. female, DOB - 12-02-1932, WJX:[id number]  Admit date - 02/11/2016   Admitting Physician Oralia Manis, MD  Outpatient Primary MD for the patient is TATE,DENNY C, MD   LOS - 3  Subjective: Patient having coughing blood pressure is low earlier now improved patient asymptomatic has a cough    Review of Systems:   CONSTITUTIONAL:Limited due to dementia  No documented fever. No fatigue, weakness. No weight gain, no weight loss.  EYES: No blurry or double vision.  ENT: No tinnitus. No postnasal drip. No redness of the oropharynx.  RESPIRATORY: Positive cough, no wheeze, no hemoptysis. No dyspnea.  CARDIOVASCULAR: No chest pain. No orthopnea. No palpitations. No syncope.  GASTROINTESTINAL: No nausea, no vomiting or diarrhea. No abdominal pain. No melena or hematochezia.  GENITOURINARY: No dysuria or hematuria.  ENDOCRINE: No polyuria or nocturia. No heat or cold intolerance.  HEMATOLOGY: No anemia. No bruising. No bleeding.  INTEGUMENTARY: No rashes. No lesions.  MUSCULOSKELETAL: Pain in the left hip No swelling. No gout.  NEUROLOGIC: No numbness, tingling, or ataxia. No seizure-type activity.  PSYCHIATRIC: No anxiety. No insomnia. No ADD.    Vitals:   Vitals:   02/14/16 0538 02/14/16 0811 02/14/16 1030 02/14/16 1354  BP:  (!) 83/36 (!) 132/46 (!) 137/55  Pulse: 81 74 74 73  Resp:      Temp:  98.5 F (36.9 C)  97.8 F (36.6 C)  TempSrc:  Oral  Axillary  SpO2: 92% 93% 92% 96%  Weight:      Height:        Wt Readings from Last 3 Encounters:  02/11/16 122 lb 9.6 oz (55.6 kg)  06/18/15 125 lb (56.7 kg)     Intake/Output Summary (Last 24 hours) at 02/14/16 1426 Last data filed at 02/14/16 0958   Gross per 24 hour  Intake            807.5 ml  Output               50 ml  Net            757.5 ml    Physical Exam:   GENERAL: Pleasant-appearing in no apparent distress.  HEAD, EYES, EARS, NOSE AND THROAT: Atraumatic, normocephalic. Extraocular muscles are intact. Pupils equal and reactive to light. Sclerae anicteric. No conjunctival injection. No oro-pharyngeal erythema.  NECK: Supple. There is no  jugular venous distention. No bruits, no lymphadenopathy, no thyromegaly.  HEART: Regular rate and rhythm,. No murmurs, no rubs, no clicks.  LUNGS: Rhonchorous rhonchus breath sounds  ABDOMEN: Soft, flat, nontender, nondistended. Has good bowel sounds. No hepatosplenomegaly appreciated.  EXTREMITIES: No evidence of any cyanosis, clubbing, or peripheral edema.  +2 pedal and radial pulses bilaterally.  NEUROLOGIC: The patient is alert, awake, and oriented x2with no focal motor or sensory deficits appreciated bilaterally.  SKIN: Moist and warm with no rashes appreciated.  Psych: Not anxious, depressed LN: No inguinal LN enlargement    Antibiotics   Anti-infectives    Start     Dose/Rate Route Frequency Ordered Stop   02/13/16 1300  levofloxacin (LEVAQUIN) IVPB 500 mg     500 mg 100 mL/hr over 60 Minutes Intravenous Every 24 hours 02/13/16 1151     02/13/16 1000  cefTRIAXone (ROCEPHIN) IVPB 1 g  Status:  Discontinued     1 g 100 mL/hr over 30 Minutes Intravenous Every 24 hours 02/13/16 0949 02/13/16 1150   02/13/16 0945  cefTRIAXone (ROCEPHIN) 1 g in dextrose 5 % 50 mL IVPB  Status:  Discontinued     1 g 100 mL/hr over 30 Minutes Intravenous Every 24 hours 02/13/16 0944 02/13/16 0949   02/12/16 1700  ceFAZolin (ANCEF) IVPB 2g/100 mL premix     2 g 200 mL/hr over 30 Minutes Intravenous Every 6 hours 02/12/16 1435 02/12/16 2235   02/12/16 0955  ceFAZolin (ANCEF) 2-4 GM/100ML-% IVPB    Comments:  Slemenda, Debbie: cabinet override      02/12/16 0955 02/12/16 1119   02/11/16 2208   ceFAZolin (ANCEF) IVPB 2g/100 mL premix     2 g 200 mL/hr over 30 Minutes Intravenous 30 min pre-op 02/11/16 2208 02/12/16 1119      Medications   Scheduled Meds: . docusate sodium  100 mg Oral BID  . enoxaparin (LOVENOX) injection  40 mg Subcutaneous Q24H  . ferrous sulfate  325 mg Oral TID PC  . levofloxacin (LEVAQUIN) IV  500 mg Intravenous Q24H  . midodrine  10 mg Oral TID WC  . potassium chloride  40 mEq Oral Q4H  . senna  1 tablet Oral BID   Continuous Infusions: . sodium chloride 75 mL/hr (02/14/16 1028)   PRN Meds:.acetaminophen **OR** acetaminophen, alum & mag hydroxide-simeth, bisacodyl, ipratropium-albuterol, LORazepam, magnesium citrate, menthol-cetylpyridinium **OR** phenol, methocarbamol **OR** methocarbamol (ROBAXIN)  IV, morphine injection, ondansetron **OR** ondansetron (ZOFRAN) IV, oxyCODONE, polyethylene glycol, promethazine, traMADol   Data Review:   Micro Results Recent Results (from the past 240 hour(s))  Urine culture     Status: Abnormal   Collection Time: 02/11/16  6:51 PM  Result Value Ref Range Status   Specimen Description URINE, RANDOM  Final   Special Requests NONE  Final   Culture 40,000 COLONIES/mL ENTEROBACTER SPECIES (A)  Final   Report Status 02/14/2016 FINAL  Final   Organism ID, Bacteria ENTEROBACTER SPECIES (A)  Final      Susceptibility   Enterobacter species - MIC*    CEFAZOLIN >=64 RESISTANT Resistant     CEFTRIAXONE >=64 RESISTANT Resistant     CIPROFLOXACIN >=4 RESISTANT Resistant     GENTAMICIN <=1 SENSITIVE Sensitive     IMIPENEM <=0.25 SENSITIVE Sensitive     NITROFURANTOIN 64 INTERMEDIATE Intermediate     TRIMETH/SULFA <=20 SENSITIVE Sensitive     PIP/TAZO >=128 RESISTANT Resistant     * 40,000 COLONIES/mL ENTEROBACTER SPECIES  Surgical pcr screen     Status:  Abnormal   Collection Time: 02/12/16 12:45 AM  Result Value Ref Range Status   MRSA, PCR NEGATIVE NEGATIVE Final   Staphylococcus aureus POSITIVE (A) NEGATIVE Final     Comment:        The Xpert SA Assay (FDA approved for NASAL specimens in patients over 61 years of age), is one component of a comprehensive surveillance program.  Test performance has been validated by [facility] for patients greater than or equal to 47 year old. It is not intended to diagnose infection nor to guide or monitor treatment.     Radiology Reports Dg Chest 1 View  Result Date: 02/13/2016 CLINICAL DATA:  Shortness of breath.  COPD. EXAM: CHEST 1 VIEW FINDINGS: The patient has slight interstitial pulmonary edema with Kerley B-lines at the right lung base. Tiny bilateral pleural effusions superimposed on COPD. Ill-defined area of of lung consolidation at the right lung base posterior medially. This has increased since the prior study consistent with progressive pneumonia. Heart size and pulmonary vascularity appear normal. Aortic atherosclerosis. IMPRESSION: Interstitial pulmonary edema with tiny bilateral effusions, superimposed on COPD. Probable progressing infiltrate at the right lung base consistent with pneumonia. Electronically Signed   By: Francene Boyers M.D.   On: 02/13/2016 11:11   Dg Chest 1 View  Result Date: 02/11/2016 CLINICAL DATA:  Fall with left hip fracture, preop. EXAM: CHEST 1 VIEW COMPARISON:  Most recent radiograph 06/17/2015. Most recent CT 06/07/2015 FINDINGS: Improved lung base aeration from prior radiographs, with residual or recurrent streaky opacity at the right lung base. Mild hyperinflation and emphysema. Left upper lobe nodule tentatively identified. Normal heart size and mediastinal contours. There is atherosclerosis of the aortic arch. No pulmonary edema, large pleural effusion or pneumothorax. Skin fold projects over the left lung base. Remote right rib fractures. IMPRESSION: 1. Streaky right lung base opacity may be chronic scarring, however recurrent atelectasis, aspiration, or pneumonia not entirely excluded. 2. Emphysema.  Thoracic aortic  atherosclerosis. 3. Left upper lobe nodule tentatively identified, as seen on prior chest CT. Recommend correlation with any interval imaging/intervention performed elsewhere. In the absence of interval imaging, recommend follow-up chest CT on a nonemergent basis for nodule re-evaluation. Electronically Signed   By: Rubye Oaks M.D.   On: 02/11/2016 18:59   Ct Hip Left Wo Contrast  Result Date: 02/11/2016 CLINICAL DATA:  Left hip pain and fracture after fall. Pre-surgical planning. EXAM: CT OF THE LEFT HIP WITHOUT CONTRAST; 3-DIMENSIONAL CT IMAGE RENDERING ON ACQUISITION WORKSTATION TECHNIQUE: Multidetector CT imaging of the left hip was performed according to the standard protocol. Multiplanar CT image reconstructions were also generated. 3D reformats were provided. COMPARISON:  Radiographs earlier this day. FINDINGS: Bones/Joint/Cartilage Impacted subcapital femoral neck fracture with mild apex anterior angulation. No articular or intertrochanteric component. Acetabulum and pubic rami are intact. Ligaments Suboptimally assessed by CT. Muscles and Tendons No fatty infiltration of the included musculature. Soft tissues Expected soft tissue stranding. Foley catheter in place. Mild vascular calcifications. 3D reformats confirm the above findings. IMPRESSION: Impacted left femoral neck fracture with mild angulation. Electronically Signed   By: Rubye Oaks M.D.   On: 02/11/2016 23:08   Ct 3d Recon At Scanner  Result Date: 02/11/2016 CLINICAL DATA:  Left hip pain and fracture after fall. Pre-surgical planning. EXAM: CT OF THE LEFT HIP WITHOUT CONTRAST; 3-DIMENSIONAL CT IMAGE RENDERING ON ACQUISITION WORKSTATION TECHNIQUE: Multidetector CT imaging of the left hip was performed according to the standard protocol. Multiplanar CT image reconstructions were also generated. 3D  reformats were provided. COMPARISON:  Radiographs earlier this day. FINDINGS: Bones/Joint/Cartilage Impacted subcapital femoral neck  fracture with mild apex anterior angulation. No articular or intertrochanteric component. Acetabulum and pubic rami are intact. Ligaments Suboptimally assessed by CT. Muscles and Tendons No fatty infiltration of the included musculature. Soft tissues Expected soft tissue stranding. Foley catheter in place. Mild vascular calcifications. 3D reformats confirm the above findings. IMPRESSION: Impacted left femoral neck fracture with mild angulation. Electronically Signed   By: Rubye OaksMelanie  Ehinger M.D.   On: 02/11/2016 23:08   Dg Hip Port Unilat With Pelvis 1v Left  Result Date: 02/12/2016 CLINICAL DATA:  Left hip replacement. EXAM: DG HIP (WITH OR WITHOUT PELVIS) 1V PORT LEFT COMPARISON:  02/11/2016 FINDINGS: Left hip hemiarthroplasty in satisfactory position and alignment. No fracture or immediate complication. IMPRESSION: Satisfactory left hip hemiarthroplasty. Electronically Signed   By: Marlan Palauharles  Clark M.D.   On: 02/12/2016 14:18   Dg Hip Unilat With Pelvis 2-3 Views Left  Result Date: 02/11/2016 CLINICAL DATA:  Left hip pain after fall from standing position. Left leg shortening and rotation. EXAM: DG HIP (WITH OR WITHOUT PELVIS) 2-3V LEFT COMPARISON:  None. FINDINGS: Impacted fracture of the left subcapital femoral neck. No additional fracture of the bony pelvis. Pubic symphysis and sacroiliac joints are congruent. The bones appear under mineralized. IMPRESSION: Impacted left subcapital femoral neck fracture. Electronically Signed   By: Rubye OaksMelanie  Ehinger M.D.   On: 02/11/2016 18:54     CBC  Recent Labs Lab 02/11/16 1823 02/12/16 0003 02/12/16 0408 02/13/16 0313 02/14/16 0959  WBC 14.4* 20.7* 17.6* 20.3* 23.9*  HGB 13.4 13.4 12.5 11.3* 10.0*  HCT 40.5 41.4 38.0 34.4* 30.9*  PLT 383 404 365 312 279  MCV 81.7 81.1 81.9 81.1 82.3  MCH 27.0 26.3 26.9 26.7 26.7  MCHC 33.1 32.4 32.8 32.9 32.4  RDW 15.2* 15.1* 15.1* 15.3* 15.2*  LYMPHSABS 2.4 1.1  --   --  1.3  MONOABS 1.1* 1.4*  --   --  1.8*   EOSABS 0.4 0.1  --   --  0.3  BASOSABS 0.1 0.1  --   --  0.0    Chemistries   Recent Labs Lab 02/11/16 1823 02/12/16 0003 02/12/16 0408 02/13/16 0313 02/14/16 0959  NA 139 141 140 137 140  K 3.6 3.5 3.4* 3.4* 3.2*  CL 101 106 106 103 109  CO2 32 28 28 27 27   GLUCOSE 95 124* 99 115* 103*  BUN 22* 19 16 14 14   CREATININE 0.91 0.74 0.68 0.76 0.70  CALCIUM 9.0 9.1 8.8* 8.3* 8.6*  AST 18 18  --   --   --   ALT 12* 13*  --   --   --   ALKPHOS 95 94  --   --   --   BILITOT 0.4 0.9  --   --   --    ------------------------------------------------------------------------------------------------------------------ estimated creatinine clearance is 48.4 mL/min (by C-G formula based on SCr of 0.7 mg/dL). ------------------------------------------------------------------------------------------------------------------ No results for input(s): HGBA1C in the last 72 hours. ------------------------------------------------------------------------------------------------------------------ No results for input(s): CHOL, HDL, LDLCALC, TRIG, CHOLHDL, LDLDIRECT in the last 72 hours. ------------------------------------------------------------------------------------------------------------------ No results for input(s): TSH, T4TOTAL, T3FREE, THYROIDAB in the last 72 hours.  Invalid input(s): FREET3 ------------------------------------------------------------------------------------------------------------------ No results for input(s): VITAMINB12, FOLATE, FERRITIN, TIBC, IRON, RETICCTPCT in the last 72 hours.  Coagulation profile  Recent Labs Lab 02/11/16 1823 02/12/16 0003  INR 1.07 1.08    No results for input(s): DDIMER in the last 72  hours.  Cardiac Enzymes No results for input(s): CKMB, TROPONINI, MYOGLOBIN in the last 168 hours.  Invalid input(s): CK ------------------------------------------------------------------------------------------------------------------ Invalid  input(s): POCBNP    Assessment & Plan  Patient is 82 year old status post left hip fracture 1.    COPD (chronic obstructive pulmonary disease) (HCC) no evidence of exasperation Continue when necessary nebulizer  2     acute hypoxia suspect this is related to aspiration pneumonia and possible CHF 3.   Hypotension increase the dose amantadine  4.  Dementia  continue therapy with lorazepam as needed 5. Leukocytosis worsened today with her urine cultures positive we will change her antibiotics 5. Miscellaneous recommend Lovenox for DVT prophylaxis      Code Status Orders        Start     Ordered   02/11/16 2316  Do not attempt resuscitation (DNR)  Continuous    Question Answer Comment  In the event of cardiac or respiratory ARREST Do not call a "code blue"   In the event of cardiac or respiratory ARREST Do not perform Intubation, CPR, defibrillation or ACLS   In the event of cardiac or respiratory ARREST Use medication by any route, position, wound care, and other measures to relive pain and suffering. May use oxygen, suction and manual treatment of airway obstruction as needed for comfort.      02/11/16 2316    Code Status History    Date Active Date Inactive Code Status Order ID Comments User Context   02/11/2016 10:06 PM 02/11/2016 10:07 PM Full Code [id number]  Juanell FairlyKevin Krasinski, MD ED   06/20/2015  4:53 PM 06/21/2015  8:10 PM DNR [id number]  Suan HalterMargaret F Campbell, MD Inpatient   06/19/2015  5:29 PM 06/20/2015  4:53 PM DNR [id number]  Suan HalterMargaret F Campbell, MD Inpatient   06/12/2015 10:26 AM 06/19/2015  5:29 PM DNR [id number]  Erin FullingKurian Kasa, MD Inpatient   06/08/2015  2:51 AM 06/12/2015 10:26 AM Full Code [id number]  Houston SirenVivek J Sainani, MD ED           Consults  Orthopedics   DVT Prophylaxis  Lovenox  Lab Results  Component Value Date   PLT 279 02/14/2016     Time Spent in minutes   32min Greater than 50% of time spent in care coordination and counseling patient regarding the condition and  plan of care.   Auburn BilberryPATEL, Paitlyn Mcclatchey M.D on 02/14/2016 at 2:26 PM  Between 7am to 6pm - Pager - [phone]  After 6pm go to [email or web address] - password [email or web address] [facility]  [facility]   Office  [phone]

## 2016-02-15 LAB — BASIC METABOLIC PANEL
Anion gap: 7 (ref 5–15)
BUN: 13 mg/dL (ref 6–20)
CALCIUM: 8.2 mg/dL — AB (ref 8.9–10.3)
CHLORIDE: 107 mmol/L (ref 101–111)
CO2: 25 mmol/L (ref 22–32)
CREATININE: 0.57 mg/dL (ref 0.44–1.00)
GFR calc non Af Amer: >60 mL/min (ref >60)
Glucose, Bld: 78 mg/dL (ref 65–99)
Potassium: 3.5 mmol/L (ref 3.5–5.1)
Sodium: 139 mmol/L (ref 135–145)

## 2016-02-15 LAB — CBC
HEMATOCRIT: 27.7 % — AB (ref 35.0–47.0)
Hemoglobin: 9.1 g/dL — ABNORMAL LOW (ref 12.0–16.0)
MCH: 27.2 pg (ref 26.0–34.0)
MCHC: 32.9 g/dL (ref 32.0–36.0)
MCV: 82.5 fL (ref 80.0–100.0)
Platelets: 300 10*3/uL (ref 150–440)
RBC: 3.36 MIL/uL — ABNORMAL LOW (ref 3.80–5.20)
RDW: 15.3 % — AB (ref 11.5–14.5)
WBC: 16.8 10*3/uL — ABNORMAL HIGH (ref 3.6–11.0)

## 2016-02-15 MED ORDER — LEVOFLOXACIN 500 MG PO TABS: 500.0000 mg | ORAL_TABLET | Freq: Every day | ORAL | 0 refills | Status: DC

## 2016-02-15 MED ORDER — MIDODRINE HCL 10 MG PO TABS: 10.0000 mg | ORAL_TABLET | Freq: Three times a day (TID) | ORAL | Status: DC

## 2016-02-15 MED ORDER — LORAZEPAM 0.5 MG PO TABS: 0.5000 mg | ORAL_TABLET | ORAL | 0 refills | Status: DC | PRN

## 2016-02-15 MED ORDER — ENOXAPARIN SODIUM 40 MG/0.4ML ~~LOC~~ SOLN: 40.0000 mg | SUBCUTANEOUS | Status: DC

## 2016-02-15 MED ORDER — MORPHINE SULFATE (CONCENTRATE) 10 MG/0.5ML PO SOLN: 5.0000 mg | ORAL | 0 refills | Status: DC | PRN

## 2016-02-15 NOTE — Progress Notes (Signed)
Physical Therapy Treatment Patient Details Name: Leah [name] MRN: [id number] DOB: 02-02-1933 Today's Date: 02/15/2016    History of Present Illness Pt is an 82 y.o female s/p fall onto L hip sustaining impacted L subcapital femoral neck fx with displacement.  Pt s/p L hip hemiarthroplasty 02/12/16 with posterolateral approach.  PMH includes dementia, COPD, htn, acute on chronic respiratory failure with hypoxia and hypercapnia.    PT Comments    Pt hesitant to get OOB d/t L hip pain but pt requesting to toilet beginning of session.  Pt requiring some increased time and encouragement with bed mobility and transfers d/t L hip pain with movement/WB'ing.  Pt pivoted on R LE bed to bedside commode using RW and then stood with RW after toileting and chair brought behind pt to sit into (deferred gait d/t pt appearing very uncomfortable with any WB'ing through L LE).   Follow Up Recommendations  SNF     Equipment Recommendations  Rolling walker with 5" wheels    Recommendations for Other Services       Precautions / Restrictions Precautions Precautions: Posterior Hip;Fall Restrictions Weight Bearing Restrictions: Yes LLE Weight Bearing: Weight bearing as tolerated    Mobility  Bed Mobility Overal bed mobility: Needs Assistance Bed Mobility: Supine to Sit     Supine to sit: Min assist;Mod assist;+2 for physical assistance     General bed mobility comments: pt requiring assist for trunk and B LE's (increased assist d/t c/o L hip pain)  Transfers Overall transfer level: Needs assistance Equipment used: Rolling walker (2 wheeled) Transfers: Sit to/from UGI CorporationStand;Stand Pivot Transfers Sit to Stand: Min assist;+2 physical assistance Stand pivot transfers: Min assist;+2 physical assistance       General transfer comment: vc's for hand and feet placement; assist to initiate and extra time to stand from bed and from commode  Ambulation/Gait             General Gait Details:  Deferred d/t L hip/thigh pain with WB'ing/activity   Stairs            Wheelchair Mobility    Modified Rankin (Stroke Patients Only)       Balance Overall balance assessment: Needs assistance Sitting-balance support: Bilateral upper extremity supported;Feet supported Sitting balance-[name]: Fair     Standing balance support: Bilateral upper extremity supported (on RW) Standing balance-[name]: Fair Standing balance comment: static standing getting cleaned up after toileting                    Cognition Arousal/Alertness: Awake/alert Behavior During Therapy: WFL for tasks assessed/performed Overall Cognitive Status: No family/caregiver present to determine baseline cognitive functioning                      Exercises      General Comments  Pt sleeping in bed upon PT arrival.      Pertinent Vitals/Pain Pain Assessment: Faces Faces Pain Scale: Hurts whole lot (minimal pain at rest beginning and end of session) Pain Location: L hip with movement/WB'ing Pain Descriptors / Indicators: Aching;Sore;Operative site guarding Pain Intervention(s): Limited activity within patient's tolerance;Monitored during session;Repositioned    Home Living                      Prior Function            PT Goals (current goals can now be found in the care plan section) Acute Rehab PT Goals Patient Stated Goal: to  have less L hip pain PT Goal Formulation: With patient Time For Goal Achievement: 02/27/16 Potential to Achieve Goals: Fair Progress towards PT goals: Progressing toward goals    Frequency    BID      PT Plan Current plan remains appropriate    Co-evaluation             End of Session Equipment Utilized During Treatment: Oxygen;Gait belt (2 L O2) Activity Tolerance: Patient limited by pain;Patient limited by fatigue Patient left: in chair;with call bell/phone within reach;with chair alarm set;with nursing/sitter in room  (pillows placed between pt's knees for posterior THP's and B heels elevated via pillows; nursing present to place IV and reported they would place SCD's when they were done)     Time: [id number] PT Time Calculation (min) (ACUTE ONLY): 38 min  Charges:  $Therapeutic Activity: 38-52 mins                    G CodesHendricks Limes:      Wrenn Willcox 02/15/2016, 11:00 AM Hendricks LimesEmily Elic Vencill, PT [phone]

## 2016-02-15 NOTE — Progress Notes (Signed)
Physical Therapy Treatment Patient Details Name: Leah [name] MRN: [id number] DOB: [birth year] Today's Date: 02/15/2016    History of Present Illness Pt is an 82 y.o female s/p fall onto L hip sustaining impacted L subcapital femoral neck fx with displacement.  Pt s/p L hip hemiarthroplasty 02/12/16 with posterolateral approach.  PMH includes dementia, COPD, htn, acute on chronic respiratory failure with hypoxia and hypercapnia.    PT Comments    Pt appearing fatigued sitting in chair and requesting back to bed.  Pt able to stand and ambulate 6 feet chair to bed with RW and minimal assist x2.  Performed some LE ex's in bed but pt fatigued quickly.  Pt requiring consistent vc's to maintain posterior THP's with activities (no carryover noted during or from session to session).  Will continue to progress pt with strengthening and progressive ambulation distance per pt tolerance.   Follow Up Recommendations  SNF     Equipment Recommendations  Rolling walker with 5" wheels    Recommendations for Other Services       Precautions / Restrictions Precautions Precautions: Posterior Hip;Fall Restrictions Weight Bearing Restrictions: Yes LLE Weight Bearing: Weight bearing as tolerated    Mobility  Bed Mobility Overal bed mobility: Needs Assistance Bed Mobility: Sit to Supine     Sit to supine: Min assist;Mod assist;+2 for physical assistance   General bed mobility comments: pt requiring assist for trunk and B LE's (increased assist d/t c/o L hip pain and fatigue)  Transfers Overall transfer level: Needs assistance Equipment used: Rolling walker (2 wheeled) Transfers: Sit to/from Stand Sit to Stand: Min guard;Min assist;+2 physical assistance Stand pivot transfers: Min assist;+2 physical assistance       General transfer comment: vc's for hand and feet placement; assist to initiate stand from chair  Ambulation/Gait Ambulation/Gait assistance: Min assist;+2 physical  assistance Ambulation Distance (Feet): 6 Feet Assistive device: Rolling walker (2 wheeled)   Gait velocity: decreased   General Gait Details: antalgic; decreased stance time L LE; vc's for walker use and gait pattern/technique required consistently   Stairs            Wheelchair Mobility    Modified Rankin (Stroke Patients Only)       Balance Overall balance assessment: Needs assistance Sitting-balance support: Bilateral upper extremity supported;Feet supported Sitting balance-[name]: Fair     Standing balance support: Bilateral upper extremity supported (on RW) Standing balance-[name]: Fair Standing balance comment: static standing                     Cognition Arousal/Alertness: Awake/alert Behavior During Therapy: WFL for tasks assessed/performed Overall Cognitive Status: No family/caregiver present to determine baseline cognitive functioning                      Exercises Total Joint Exercises Short Arc Quad: AAROM;Strengthening;Both;10 reps;Supine Heel Slides: AAROM;Strengthening;Both;10 reps;Supine Hip ABduction/ADduction: AAROM;Strengthening;Both;10 reps;Supine  Vc's required for ex' technique.    General Comments        Pertinent Vitals/Pain Pain Assessment: Faces Faces Pain Scale: Hurts whole lot Pain Location: L hip with movement/WB'ing Pain Descriptors / Indicators: Aching;Sore;Operative site guarding Pain Intervention(s): Limited activity within patient's tolerance;Monitored during session;Repositioned  Vitals (HR and O2 on 2 L/min O2 via nasal cannula) stable and WFL throughout treatment session.    Home Living                      Prior Function  PT Goals (current goals can now be found in the care plan section) Acute Rehab PT Goals Patient Stated Goal: to have less L hip pain PT Goal Formulation: With patient Time For Goal Achievement: 02/27/16 Potential to Achieve Goals: Fair Progress  towards PT goals: Progressing toward goals    Frequency    BID      PT Plan Current plan remains appropriate    Co-evaluation             End of Session Equipment Utilized During Treatment: Oxygen;Gait belt (2 L O2) Activity Tolerance: Patient limited by pain;Patient limited by fatigue Patient left: in bed;with call bell/phone within reach;with bed alarm set;with SCD's reapplied (B heels elevated via towel rolls; hip abduction brace in place)     Time: [id number] PT Time Calculation (min) (ACUTE ONLY): 23 min  Charges:  $Therapeutic Exercise: 8-22 mins $Therapeutic Activity: 8-22 mins                    G CodesHendricks [name]:      Leah [name] 02/15/2016, 2:11 PM Leah LimesEmily Shamirah [name], PT [phone]

## 2016-02-15 NOTE — Progress Notes (Signed)
Occupational Therapy Treatment Patient Details Name: Leah [name] MRN: [id number] DOB: June 12, 1932 Today's Date: 02/15/2016    History of present illness Pt. is an 82 y.o. female who [facility]  for Left Hemiarthroplasty repair after sustaining an impacted Left Subcapital Femoral Neck Fracture. pt. has a history of Dementia, COPD, HTN, respiratory failure, and Hypoxia.   OT comments  Pt. Is limited by fatigue, lethargy, cognition, and pain. Pt. was able to follow 1 step hand to face patterns for light self-grooming tasks with cues. Pt. continues to benefit from skilled OT services to continue ADL training and education, work towards returning to her PLOF.   Follow Up Recommendations  SNF    Equipment Recommendations       Recommendations for Other Services      Precautions / Restrictions Precautions Precautions: Posterior Hip;Fall Restrictions Weight Bearing Restrictions: Yes LLE Weight Bearing: Weight bearing as tolerated              ADL Overall ADL's : Needs assistance/impaired     Grooming: Set up;Minimal assistance                                 General ADL Comments: Pt. able to follow simple one step hand to fac patterns for light self-grooming tasks with cues.      Vision                     Perception     Praxis      Cognition   Behavior During Therapy: WFL for tasks assessed/performed Overall Cognitive Status: History of cognitive impairments - at baseline                       Extremity/Trunk Assessment               Exercises   Shoulder Instructions   General Comments      Pertinent Vitals/ Pain       Pain: unable to rate  Home Living                                          Prior Functioning/Environment              Frequency  Min 1X/week        Progress Toward Goals  OT Goals(current goals can now be found in the care plan section)  Progress towards OT  goals: Not progressing toward goals - comment (cognition, and lethargy hinder functional progress.)  Acute Rehab OT Goals Patient Stated Goal: to have less L hip pain OT Goal Formulation: With patient Potential to Achieve Goals: Poor  Plan      Co-evaluation                 End of Session     Activity Tolerance Patient limited by fatigue   Patient Left in bed;with call bell/phone within reach;with bed alarm set   Nurse Communication          Time: 1345-1400 OT Time Calculation (min): 15 min  Charges: OT General Charges $OT Visit: 1 Procedure OT Treatments $Self Care/Home Management : 8-22 mins  Leah MessierElaine Oluwatobiloba Martin, MS, OTR/L 02/15/2016, 2:38 PM

## 2016-02-15 NOTE — Discharge Instructions (Signed)
[facility] - [place] at [facility]  DIET:  Dysphagia 3 diet with nectar thick Please send extra gravy on chopped meats. Yogurt TID meals. Likes puddings.  DISCHARGE CONDITION:  Stable  ACTIVITY:  Activity as tolerated  OXYGEN:  Home Oxygen: yes   Oxygen Delivery: 2 liters/min via Patient connected to nasal cannula oxygen  DISCHARGE LOCATION:  nursing home    ADDITIONAL DISCHARGE INSTRUCTION:Staple removal as per orthopedics   If you experience worsening of your admission symptoms, develop shortness of breath, life threatening emergency, suicidal or homicidal thoughts you must seek medical attention immediately by calling 911 or calling your MD immediately  if symptoms less severe.  You Must read complete instructions/literature along with all the possible adverse reactions/side effects for all the Medicines you take and that have been prescribed to you. Take any new Medicines after you have completely understood and accpet all the possible adverse reactions/side effects.   Please note  You were cared for by a hospitalist during your hospital stay. If you have any questions about your discharge medications or the care you received while you were in the hospital after you are discharged, you can call the unit and asked to speak with the hospitalist on call if the hospitalist that took care of you is not available. Once you are discharged, your primary care physician will handle any further medical issues. Please note that NO REFILLS for any discharge medications will be authorized once you are discharged, as it is imperative that you return to your primary care physician (or establish a relationship with a primary care physician if you do not have one) for your aftercare needs so that they can reassess your need for medications and monitor your lab values.

## 2016-02-15 NOTE — Plan of Care (Signed)
Problem: SLP Dysphagia Goals Goal: Misc Dysphagia Goal Pt will safely tolerate po diet of least restrictive consistency w/ no overt s/s of aspiration noted by Staff/pt/family x3 sessions.    

## 2016-02-15 NOTE — Progress Notes (Signed)
Pharmacy Antibiotic Note  Leah [name] is a 82 y.o. female admitted on 02/11/2016 with now with possible aspiration pneumonia and UTI.  Pharmacy was consulted on 12/7 for meropenem dosing.  Pt with increasing WBC.   Plan: Continue Meropenem 1 g IV q12h based on current renal function.   Height: 5\' 6"  (167.6 cm) Weight: 122 lb 9.6 oz (55.6 kg) IBW/kg (Calculated) : 59.3  Temp (24hrs), Avg:98.6 F (37 C), Min:97.8 F (36.6 C), Max:99 F (37.2 C)   Recent Labs Lab 02/12/16 0003 02/12/16 0408 02/13/16 0313 02/14/16 0959 02/15/16 0436  WBC 20.7* 17.6* 20.3* 23.9* 16.8*  CREATININE 0.74 0.68 0.76 0.70 0.57    Estimated Creatinine Clearance: 48.4 mL/min (by C-G formula based on SCr of 0.57 mg/dL).    No Known Allergies  Antimicrobials this admission: Ceftriaxone 12/6 >>12/6 Levaquin 12/6 >>12/7 Meropenem 12/7 >>  Dose adjustments this admission:   Microbiology results: 12/5 UCx: 40k enterobacter spp - per microlab I to levaquin, S to imipenem and ertapenem as well 12/5 MRSA PCR: neg, SA +  Thank you for allowing pharmacy to be a part of this patient's care.  Gardner CandleSheema M Fran Mcree, PharmD, BCPS Clinical Pharmacist  02/15/2016 8:59 AM

## 2016-02-15 NOTE — Progress Notes (Signed)
  Subjective:  POD #3  S/p left hip hemiarthroplasty.  Patient reports pain as mild.  She primarily only has pain when she moves the left lower extremity. She has no pain at rest.  Objective:   VITALS:   Vitals:   02/14/16 1958 02/15/16 0414 02/15/16 0825 02/15/16 1030  BP: (!) 149/50 (!) 161/44 (!) 160/56 132/78  Pulse: 76 73 72 79  Resp: 16 16 17    Temp: 99 F (37.2 C) 98.6 F (37 C) 98.9 F (37.2 C)   TempSrc: Oral Oral Oral   SpO2: 96% 93% 92% 92%  Weight:      Height:        PHYSICAL EXAM:  Left lower extremity: Patient has a small amount of serosanguineous drainage on her hip dressing. Through the honeycomb dressing there is no evidence of active drainage. Patient no erythema or ecchymosis. Her thigh compartments are soft and compressible. Distally she has palpable pedal pulses, intact sensation light touch and intact motor function of both lower extremity. She can flex and extend her toes and dorsiflex and plantarflex her ankles bilaterally.   LABS  Results for orders placed or performed during the hospital encounter of 02/11/16 (from the past 24 hour(s))  CBC     Status: Abnormal   Collection Time: 02/15/16  4:36 AM  Result Value Ref Range   WBC 16.8 (H) 3.6 - 11.0 K/uL   RBC 3.36 (L) 3.80 - 5.20 MIL/uL   Hemoglobin 9.1 (L) 12.0 - 16.0 g/dL   HCT 16.127.7 (L) 09.635.0 - 04.547.0 %   MCV 82.5 80.0 - 100.0 fL   MCH 27.2 26.0 - 34.0 pg   MCHC 32.9 32.0 - 36.0 g/dL   RDW 40.915.3 (H) 81.111.5 - 91.414.5 %   Platelets 300 150 - 440 K/uL  Basic metabolic panel     Status: Abnormal   Collection Time: 02/15/16  4:36 AM  Result Value Ref Range   Sodium 139 135 - 145 mmol/L   Potassium 3.5 3.5 - 5.1 mmol/L   Chloride 107 101 - 111 mmol/L   CO2 25 22 - 32 mmol/L   Glucose, Bld 78 65 - 99 mg/dL   BUN 13 6 - 20 mg/dL   Creatinine, Ser 7.820.57 0.44 - 1.00 mg/dL   Calcium 8.2 (L) 8.9 - 10.3 mg/dL   GFR calc non Af Amer >60 >60 mL/min   GFR calc Af Amer >60 >60 mL/min   Anion gap 7 5 - 15     No results found.  Assessment/Plan: 3 Days Post-Op   Principal Problem:   Closed left hip fracture (HCC) Active Problems:   COPD (chronic obstructive pulmonary disease) (HCC)   HTN (hypertension)   Dementia  Patient is progressing from an orthopedic standpoint. Continue physical and occupational therapy. Patient is on posterior hip precautions. She may weight-bear as tolerated in the left lower extremity. She may be discharged to rehabilitation from an orthopedic standpoint whenever she is medically cleared.    Juanell FairlyKRASINSKI, Shaleka Brines , MD 02/15/2016, 12:50 PM

## 2016-02-15 NOTE — Discharge Summary (Addendum)
[facility] [place] [facility] I [name], 82 y.o., DOB 05/05/1932, MRN [id number]. Admission date: 02/11/2016 Discharge Date 02/15/2016 Primary MD Jaclyn Shaggy, MD Admitting Physician Oralia Manis, MD  Admission Diagnosis  Hip fracture [facility]) [S72.009A] Fall [W19.XXXA] Closed subcapital fracture of left femur, initial encounter (HCC) [S72.012A]  Discharge Diagnosis   Principal Problem:   Closed left hip fracture (HCC)   Acute hypoxic respiratory failure due to aspiration pneumonia   Hypertension   Dementia   Leukocytosis due to pneumonia   COPD (chronic obstructive pulmonary disease) (HCC)   HTN (hypertension)   Dementia         Hospital Course Leah [name]  is a 82 y.o. female who presents with Mechanical fall and subsequent hip fracture. Patient does have dementia. And currently resides in a skilled nursing facility. She was seen by orthopedics and underwent hip repair. Postoperatively patient developed hypotension and hypoxia. Chest x-ray suggested a possible pneumonia as well as congestive heart failure. Due to her low blood pressure we were unable to give her IV Lasix. She was treated with IV antibiotics. Her WBC count was elevated patient also had abnormal urine cultures which showed 40,000 bacteria. Patient has dementia and unable to contribute any thing to her history.            Consults  orthopedic surgery  Significant Tests:  See full reports for all details     Dg Chest 1 View  Result Date: 02/13/2016 CLINICAL DATA:  Shortness of breath.  COPD. EXAM: CHEST 1 VIEW FINDINGS: The patient has slight interstitial pulmonary edema with Kerley B-lines at the right lung base. Tiny bilateral pleural effusions superimposed on COPD. Ill-defined area of of lung consolidation at the right lung base posterior medially. This has increased since the prior study consistent with progressive pneumonia. Heart size and pulmonary vascularity appear normal.  Aortic atherosclerosis. IMPRESSION: Interstitial pulmonary edema with tiny bilateral effusions, superimposed on COPD. Probable progressing infiltrate at the right lung base consistent with pneumonia. Electronically Signed   By: Francene Boyers M.D.   On: 02/13/2016 11:11   Dg Chest 1 View  Result Date: 02/11/2016 CLINICAL DATA:  Fall with left hip fracture, preop. EXAM: CHEST 1 VIEW COMPARISON:  Most recent radiograph 06/17/2015. Most recent CT 06/07/2015 FINDINGS: Improved lung base aeration from prior radiographs, with residual or recurrent streaky opacity at the right lung base. Mild hyperinflation and emphysema. Left upper lobe nodule tentatively identified. Normal heart size and mediastinal contours. There is atherosclerosis of the aortic arch. No pulmonary edema, large pleural effusion or pneumothorax. Skin fold projects over the left lung base. Remote right rib fractures. IMPRESSION: 1. Streaky right lung base opacity may be chronic scarring, however recurrent atelectasis, aspiration, or pneumonia not entirely excluded. 2. Emphysema.  Thoracic aortic atherosclerosis. 3. Left upper lobe nodule tentatively identified, as seen on prior chest CT. Recommend correlation with any interval imaging/intervention performed elsewhere. In the absence of interval imaging, recommend follow-up chest CT on a nonemergent basis for nodule re-evaluation. Electronically Signed   By: Rubye Oaks M.D.   On: 02/11/2016 18:59   Ct Hip Left Wo Contrast  Result Date: 02/11/2016 CLINICAL DATA:  Left hip pain and fracture after fall. Pre-surgical planning. EXAM: CT OF THE LEFT HIP WITHOUT CONTRAST; 3-DIMENSIONAL CT IMAGE RENDERING ON ACQUISITION WORKSTATION TECHNIQUE: Multidetector CT imaging of the left hip was performed according to the standard protocol. Multiplanar CT image reconstructions were also generated. 3D reformats were provided. COMPARISON:  Radiographs  earlier this day. FINDINGS: Bones/Joint/Cartilage Impacted  subcapital femoral neck fracture with mild apex anterior angulation. No articular or intertrochanteric component. Acetabulum and pubic rami are intact. Ligaments Suboptimally assessed by CT. Muscles and Tendons No fatty infiltration of the included musculature. Soft tissues Expected soft tissue stranding. Foley catheter in place. Mild vascular calcifications. 3D reformats confirm the above findings. IMPRESSION: Impacted left femoral neck fracture with mild angulation. Electronically Signed   By: Rubye OaksMelanie  Ehinger M.D.   On: 02/11/2016 23:08   Ct 3d Recon At Scanner  Result Date: 02/11/2016 CLINICAL DATA:  Left hip pain and fracture after fall. Pre-surgical planning. EXAM: CT OF THE LEFT HIP WITHOUT CONTRAST; 3-DIMENSIONAL CT IMAGE RENDERING ON ACQUISITION WORKSTATION TECHNIQUE: Multidetector CT imaging of the left hip was performed according to the standard protocol. Multiplanar CT image reconstructions were also generated. 3D reformats were provided. COMPARISON:  Radiographs earlier this day. FINDINGS: Bones/Joint/Cartilage Impacted subcapital femoral neck fracture with mild apex anterior angulation. No articular or intertrochanteric component. Acetabulum and pubic rami are intact. Ligaments Suboptimally assessed by CT. Muscles and Tendons No fatty infiltration of the included musculature. Soft tissues Expected soft tissue stranding. Foley catheter in place. Mild vascular calcifications. 3D reformats confirm the above findings. IMPRESSION: Impacted left femoral neck fracture with mild angulation. Electronically Signed   By: Rubye OaksMelanie  Ehinger M.D.   On: 02/11/2016 23:08   Dg Hip Port Unilat With Pelvis 1v Left  Result Date: 02/12/2016 CLINICAL DATA:  Left hip replacement. EXAM: DG HIP (WITH OR WITHOUT PELVIS) 1V PORT LEFT COMPARISON:  02/11/2016 FINDINGS: Left hip hemiarthroplasty in satisfactory position and alignment. No fracture or immediate complication. IMPRESSION: Satisfactory left hip hemiarthroplasty.  Electronically Signed   By: Marlan Palauharles  Clark M.D.   On: 02/12/2016 14:18   Dg Hip Unilat With Pelvis 2-3 Views Left  Result Date: 02/11/2016 CLINICAL DATA:  Left hip pain after fall from standing position. Left leg shortening and rotation. EXAM: DG HIP (WITH OR WITHOUT PELVIS) 2-3V LEFT COMPARISON:  None. FINDINGS: Impacted fracture of the left subcapital femoral neck. No additional fracture of the bony pelvis. Pubic symphysis and sacroiliac joints are congruent. The bones appear under mineralized. IMPRESSION: Impacted left subcapital femoral neck fracture. Electronically Signed   By: Rubye OaksMelanie  Ehinger M.D.   On: 02/11/2016 18:54       Today   Subjective:   Leah [name]  patient confused and unable to provide any review of systems but appears comfortable  Objective:   Blood pressure 132/78, pulse 79, temperature 98.9 F (37.2 C), temperature source Oral, resp. rate 17, height 5\' 6"  (1.676 m), weight 122 lb 9.6 oz (55.6 kg), SpO2 92 %.  .   Exam VITAL SIGNS: Blood pressure 132/78, pulse 79, temperature 98.9 F (37.2 C), temperature source Oral, resp. rate 17, height 5\' 6"  (1.676 m), weight 122 lb 9.6 oz (55.6 kg), SpO2 92 %.  GENERAL:  82 y.o.-year-old patient lying in the bed with no acute distress.  EYES: Pupils equal, round, reactive to light and accommodation. No scleral icterus. HEENT: Head atraumatic, normocephalic. Oropharynx and nasopharynx clear.  NECK:  Supple, no jugular venous distention. No thyroid enlargement, no tenderness.  LUNGS: Decreased breath sounds bilaterally, positive right lung base wheezing, positive rhonchi right lung base and left lung base. No use of accessory muscles of respiration.  CARDIOVASCULAR: S1, S2 normal. No murmurs, rubs, or gallops.  ABDOMEN: Soft, nontender, nondistended. Bowel sounds present. No organomegaly or mass.  EXTREMITIES: No pedal edema, cyanosis, or clubbing.  NEUROLOGIC:  Cranial nerves II through XII are intact. Patient able to  wiggle toes bilaterally. Gait not checked.  PSYCHIATRIC: The patient is alert.  SKIN: No obvious rash, lesion, or ulcer.   Data Review     CBC w Diff:  Lab Results  Component Value Date   WBC 16.8 (H) 02/15/2016   HGB 9.1 (L) 02/15/2016   HCT 27.7 (L) 02/15/2016   PLT 300 02/15/2016   LYMPHOPCT 6 02/14/2016   MONOPCT 8 02/14/2016   EOSPCT 1 02/14/2016   BASOPCT 0 02/14/2016   CMP:  Lab Results  Component Value Date   NA 139 02/15/2016   K 3.5 02/15/2016   CL 107 02/15/2016   CO2 25 02/15/2016   BUN 13 02/15/2016   CREATININE 0.57 02/15/2016   PROT 7.7 02/12/2016   ALBUMIN 3.6 02/12/2016   BILITOT 0.9 02/12/2016   ALKPHOS 94 02/12/2016   AST 18 02/12/2016   ALT 13 (L) 02/12/2016  .  Micro Results Recent Results (from the past 240 hour(s))  Urine culture     Status: Abnormal   Collection Time: 02/11/16  6:51 PM  Result Value Ref Range Status   Specimen Description URINE, RANDOM  Final   Special Requests NONE  Final   Culture 40,000 COLONIES/mL ENTEROBACTER SPECIES (A)  Final   Report Status 02/14/2016 FINAL  Final   Organism ID, Bacteria ENTEROBACTER SPECIES (A)  Final      Susceptibility   Enterobacter species - MIC*    CEFAZOLIN >=64 RESISTANT Resistant     CEFTRIAXONE >=64 RESISTANT Resistant     CIPROFLOXACIN >=4 RESISTANT Resistant     GENTAMICIN <=1 SENSITIVE Sensitive     IMIPENEM <=0.25 SENSITIVE Sensitive     NITROFURANTOIN 64 INTERMEDIATE Intermediate     TRIMETH/SULFA <=20 SENSITIVE Sensitive     PIP/TAZO >=128 RESISTANT Resistant     * 40,000 COLONIES/mL ENTEROBACTER SPECIES  Surgical pcr screen     Status: Abnormal   Collection Time: 02/12/16 12:45 AM  Result Value Ref Range Status   MRSA, PCR NEGATIVE NEGATIVE Final   Staphylococcus aureus POSITIVE (A) NEGATIVE Final    Comment:        The Xpert SA Assay (FDA approved for NASAL specimens in patients over 85 years of age), is one component of a comprehensive surveillance program.  Test  performance has been validated by [facility] for patients greater than or equal to 25 year old. It is not intended to diagnose infection nor to guide or monitor treatment.         Code Status Orders        Start     Ordered   02/11/16 2316  Do not attempt resuscitation (DNR)  Continuous    Question Answer Comment  In the event of cardiac or respiratory ARREST Do not call a "code blue"   In the event of cardiac or respiratory ARREST Do not perform Intubation, CPR, defibrillation or ACLS   In the event of cardiac or respiratory ARREST Use medication by any route, position, wound care, and other measures to relive pain and suffering. May use oxygen, suction and manual treatment of airway obstruction as needed for comfort.      02/11/16 2316    Code Status History    Date Active Date Inactive Code Status Order ID Comments User Context   02/11/2016 10:06 PM 02/11/2016 10:07 PM Full Code [id number]  Juanell Fairly, MD ED   06/20/2015  4:53 PM 06/21/2015  8:10 PM DNR  [id number]  Suan Halter, MD Inpatient   06/19/2015  5:29 PM 06/20/2015  4:53 PM DNR [id number]  Suan Halter, MD Inpatient   06/12/2015 10:26 AM 06/19/2015  5:29 PM DNR [id number]  Erin Fulling, MD Inpatient   06/08/2015  2:51 AM 06/12/2015 10:26 AM Full Code [id number]  Houston Siren, MD ED           Contact information for follow-up providers    Juanell Fairly, MD Follow up in 2 week(s).   Specialty:  Orthopedic Surgery Contact information: [address] [place] [address] [phone]            Contact information for after-discharge care    Destination    [facility] Follow up.   Specialty:  Skilled Nursing Facility Contact information: [address] [place] [address] [phone]                  Discharge Medications     Medication List    TAKE these medications   acetaminophen 325 MG tablet Commonly known as:   TYLENOL Take 2 tablets (650 mg total) by mouth every 4 (four) hours as needed for mild pain or fever.   bisacodyl 10 MG suppository Commonly known as:  DULCOLAX Place 1 suppository (10 mg total) rectally daily as needed for moderate constipation.   enoxaparin 40 MG/0.4ML injection Commonly known as:  LOVENOX Inject 0.4 mLs (40 mg total) into the skin daily. Start taking on:  02/16/2016   ipratropium-albuterol 0.5-2.5 (3) MG/3ML Soln Commonly known as:  DUONEB Take 3 mLs by nebulization every 2 (two) hours as needed.   levofloxacin 500 MG tablet Commonly known as:  LEVAQUIN Take 1 tablet (500 mg total) by mouth daily.   LORazepam 0.5 MG tablet Commonly known as:  ATIVAN Take 1 tablet (0.5 mg total) by mouth every 4 (four) hours as needed for anxiety.   midodrine 10 MG tablet Commonly known as:  PROAMATINE Take 1 tablet (10 mg total) by mouth 3 (three) times daily with meals.   morphine CONCENTRATE 10 MG/0.5ML Soln concentrated solution Take 0.25 mLs (5 mg total) by mouth every hour as needed for moderate pain, severe pain or shortness of breath. What changed:  Another medication with the same name was removed. Continue taking this medication, and follow the directions you see here.   prochlorperazine 25 MG suppository Commonly known as:  COMPAZINE Place 1 suppository (25 mg total) rectally every 8 (eight) hours as needed for nausea or vomiting.   senna-docusate 8.6-50 MG tablet Commonly known as:  Senokot-S Take 1 tablet by mouth 2 (two) times daily.      Addendum by Dr. Renae Gloss. 1. Right lower lobe pneumonia. Antibiotics switched from meropenem over to Levaquin for 5 more days. Prescription written. 2. Fluid overload secondary to IV fluids given during the hospital course. One dose of Lasix given today. 3. Acute respiratory failure with hypoxia. Nurse trying to taper off oxygen here in the hospital. If we are unable to get her off the oxygen will and up needing oxygen 2 L  nasal cannula as needed for pulse ox less than 88%. 4. Hypotension on midodrine 5. Dementia without behavioral disturbance. On when necessary lorazepam 6. Leukocytosis. This has improved. White blood cell count today 13.6 7. Postoperative anemia. Dilutional from hydration and also postoperative loss. Continue to monitor as outpatient 8. DVT prophylaxis with Lovenox for 14 days. 9. Hypokalemia replaced oral potassium today 10. Left hip  fracture. Continue rehabilitation at facility. 11. Patient is a DO NOT RESUSCITATE 12. Case discussed with son on the phone 13. Patient is on dysphagia 3 diet with nectar thickened liquids    Total Time in preparing paper work, data evaluation and todays exam - 38 minutes  Auburn BilberryPATEL, SHREYANG M.D on 02/15/2016 at 1:11 PM  [facility]   Office  [phone]

## 2016-02-15 NOTE — Evaluation (Signed)
Clinical/Bedside Swallow Evaluation Patient Details  Name: Leah [name] MRN: [id number] Date of Birth: [birth year]  Today's Date: 02/15/2016 Time: SLP Start Time (ACUTE ONLY): 0910 SLP Stop Time (ACUTE ONLY): 1010 SLP Time Calculation (min) (ACUTE ONLY): 60 min  Past Medical History:  Past Medical History:  Diagnosis Date  . COPD (chronic obstructive pulmonary disease) (HCC)   . Dementia   . Essential hypertension    Past Surgical History:  Past Surgical History:  Procedure Laterality Date  . HIP ARTHROPLASTY Left 02/12/2016   Procedure: ARTHROPLASTY BIPOLAR HIP (HEMIARTHROPLASTY);  Surgeon: Juanell FairlyKevin Krasinski, MD;  Location: [facility] ORS;  Service: Orthopedics;  Laterality: Left;  . NO PAST SURGERIES     HPI:  Pt is a 82 y.o. female who presents with Mechanical fall and subsequent hip fracture. Patient is demented and is unable to contribute much to her history of present illness, but family is in the room at bedside and provides information. Patient is a resident at nursing facility and reportedly fell from a standing position. Pt has significant medical history including COPD/emphysema per chart; Alzheimer's Dementia; HTN; current tobacco use. Per chart notes, pt had an extended hospitalization in which she was orally intubated then on BiPAP d/t declined Pulmonary status. Pt's health status declined further and [facility] was [facility] per chart notes (April 2017).    Assessment / Plan / Recommendation Clinical Impression  Pt appears to present w/ increased risk for Aspiration secondary to soft signs of Dysphagia suspect d/t her baseline Dementia (diagnosed), and in addition, a delayed, mild throat clearing was noted post trials of thin liquids. Pt consumed few trials of thin liquids via CUP w/ no overt coughing noted, however, audible swallows and the mild, delayed throat clearing was noted x1. Pt also tended to hold the sips of water orally for a min increased amount of time b/f  the A-P transfer and swallow sequence was initiated. Unsure if this is usual for pt, but any oral phase deficits of swallowing can increase risk for pharyngeal phase deficits and aspiration to occur. Pt appeared to clear orally given time w/ the softer foods(breakfast meal). As pt does present w/ increased risk for Dysphagia and Aspiration d/t baseline Cognitive status as well as new fall w/ hip fx and repair(pt may be more sedentary at this time), recommend a Dysphagia diet of mech soft w/ Nectar liquids. Also recommend strict aspiration precautions; medsin Puree; monitoring for any sequelae of aspiration to include decline in Pulmonary status/function. Recommend ST services f/u post discharge for further ST dysphagia assessment/diet upgrade of liquids if indicated. MD consulted re: above and agreed fully.     Aspiration Risk  Mild aspiration risk (+)-Moderate aspiration risk   Diet Recommendation   Dysphagia 3 w/ Nectar liquids via CUP; strict aspiration precautions; setup assistance at meals. Rest breaks to avoid any increased SOB during meals.   Medication Administration: Whole meds with puree (or crushed if indicated and able to)    Other  Recommendations Recommended Consults:  (Dietician f/u) Oral Care Recommendations: Oral care BID;Staff/trained caregiver to provide oral care Other Recommendations: Order thickener from pharmacy;Prohibited food (jello, ice cream, thin soups);Remove water pitcher;Have oral suction available   Follow up Recommendations Skilled Nursing facility (TBD)      Frequency and Duration min 3x week  2 weeks       Prognosis Prognosis for Safe Diet Advancement: Fair Barriers to Reach Goals: Cognitive deficits      Swallow Study   General Date  of Onset: 02/11/16 HPI: Pt is a 82 y.o. female who presents with Mechanical fall and subsequent hip fracture. Patient is demented and is unable to contribute much to her history of present illness, but family is in the room  at bedside and provides information. Patient is a resident at nursing facility and reportedly fell from a standing position. Pt has significant medical history including COPD/emphysema per chart; Alzheimer's Dementia; HTN; current tobacco use. Per chart notes, pt had an extended hospitalization in which she was orally intubated then on BiPAP d/t declined Pulmonary status. Pt's health status declined further and she was discharged to [facility] per chart notes (April 2017).  Type of Study: Bedside Swallow Evaluation Previous Swallow Assessment: unab le to complete during the 06/2015 hospitalization d/t medical status Diet Prior to this Study: Regular;Thin liquids (has been ordered; unsure of NH) Temperature Spikes Noted: No (wbc 16.8; has been elevated) Respiratory Status: Nasal cannula (2 liters) History of Recent Intubation: No Behavior/Cognition: Cooperative;Pleasant mood;Confused;Distractible;Requires cueing (awake; baseline Dementia dx) Oral Cavity Assessment: Within Functional Limits (eating meal) Oral Care Completed by SLP: Recent completion by staff Oral Cavity - Dentition: Adequate natural dentition;Missing dentition Vision: Functional for self-feeding Self-Feeding Abilities: Able to feed self;Needs assist;Needs set up Patient Positioning: Upright in bed Baseline Vocal Quality: Normal;Low vocal intensity (min) Volitional Cough: Congested (Fair) Volitional Swallow: Able to elicit    Oral/Motor/Sensory Function Overall Oral Motor/Sensory Function: Within functional limits (grossly)   Ice Chips Ice chips: Not tested (eating meal)   Thin Liquid Thin Liquid: Impaired Presentation: Cup;Self Fed Oral Phase Impairments:  (min holding) Oral Phase Functional Implications: Prolonged oral transit;Oral holding (min ) Pharyngeal  Phase Impairments: Throat Clearing - Delayed (x1/7 trials) Other Comments: pt refused to take further stating she needed to go to the bathroom    Nectar Thick Nectar  Thick Liquid: Not tested Other Comments: declined stating she needed to go to the bathroom   Honey Thick Honey Thick Liquid: Not tested   Puree Puree: Within functional limits Presentation: Self Fed;Spoon (3 trials)   Solid   GO   Solid: Impaired (softened, broken down foods) Presentation: Self Fed;Spoon (3 trials) Oral Phase Impairments:  (increased time for clearing fully) Oral Phase Functional Implications:  (increased time for full oral clearing) Pharyngeal Phase Impairments:  (none)          Jerilynn SomKatherine Watson, MS, CCC-SLP Watson,Katherine 02/15/2016,10:37 AM

## 2016-02-15 NOTE — Progress Notes (Signed)
Per MD patient will likely be [facility]. Clinical Child psychotherapistocial Worker (CSW) [facility] today. CSW contacted patient's son Siri ColeHerbert and made him aware of above. CSW will continue to follow and assisr as needed.   Baker Hughes IncorporatedBailey Maliha Outten, LCSW [phone]

## 2016-02-15 NOTE — Plan of Care (Signed)
Problem: Safety: Goal: Ability to remain free from injury will improve Outcome: Progressing Pt free from falls this shift.  Problem: Pain Managment: Goal: General experience of comfort will improve Outcome: Progressing No complaints of pain this shift.

## 2016-02-15 NOTE — Progress Notes (Signed)
[employer] - [place] at [facility]                                                                                                                                                                                  Patient Demographics   Leah [name], is a 82 y.o. female, DOB - [birth year], MVH:[id number]  Admit date - 02/11/2016   Admitting Physician Oralia Manis, MD  Outpatient Primary MD for the patient is TATE,DENNY C, MD   LOS - 4  Subjective: Nation's WBC count has decreased today. She has no complaints  Review of Systems:   CONSTITUTIONAL:Limited due to dementia  No documented fever. No fatigue, weakness. No weight gain, no weight loss.  EYES: No blurry or double vision.  ENT: No tinnitus. No postnasal drip. No redness of the oropharynx.  RESPIRATORY: Positive cough, no wheeze, no hemoptysis. No dyspnea.  CARDIOVASCULAR: No chest pain. No orthopnea. No palpitations. No syncope.  GASTROINTESTINAL: No nausea, no vomiting or diarrhea. No abdominal pain. No melena or hematochezia.  GENITOURINARY: No dysuria or hematuria.  ENDOCRINE: No polyuria or nocturia. No heat or cold intolerance.  HEMATOLOGY: No anemia. No bruising. No bleeding.  INTEGUMENTARY: No rashes. No lesions.  MUSCULOSKELETAL: Pain in the left hip No swelling. No gout.  NEUROLOGIC: No numbness, tingling, or ataxia. No seizure-type activity.  PSYCHIATRIC: No anxiety. No insomnia. No ADD.    Vitals:   Vitals:   02/14/16 1958 02/15/16 0414 02/15/16 0825 02/15/16 1030  BP: (!) 149/50 (!) 161/44 (!) 160/56 132/78  Pulse: 76 73 72 79  Resp: 16 16 17    Temp: 99 F (37.2 C) 98.6 F (37 C) 98.9 F (37.2 C)   TempSrc: Oral Oral Oral   SpO2: 96% 93% 92% 92%  Weight:      Height:        Wt Readings from Last 3 Encounters:  02/11/16 122 lb 9.6 oz (55.6 kg)  06/18/15 125 lb (56.7 kg)     Intake/Output Summary (Last 24 hours) at 02/15/16 1315 Last data filed at 02/15/16 0313  Gross per 24 hour   Intake          2728.75 ml  Output                0 ml  Net          2728.75 ml    Physical Exam:   GENERAL: Pleasant-appearing in no apparent distress.  HEAD, EYES, EARS, NOSE AND THROAT: Atraumatic, normocephalic. Extraocular muscles are intact. Pupils equal and reactive to light. Sclerae anicteric. No conjunctival injection. No oro-pharyngeal erythema.  NECK: Supple. There is no jugular venous distention. No bruits, no lymphadenopathy,  no thyromegaly.  HEART: Regular rate and rhythm,. No murmurs, no rubs, no clicks.  LUNGS: Clear to auscultation bilaterally without any rales rhonchi wheezing ABDOMEN: Soft, flat, nontender, nondistended. Has good bowel sounds. No hepatosplenomegaly appreciated.  EXTREMITIES: No evidence of any cyanosis, clubbing, or peripheral edema.  +2 pedal and radial pulses bilaterally.  NEUROLOGIC: The patient is alert, awake, and oriented x2with no focal motor or sensory deficits appreciated bilaterally.  SKIN: Moist and warm with no rashes appreciated.  Psych: Not anxious, depressed LN: No inguinal LN enlargement    Antibiotics   Anti-infectives    Start     Dose/Rate Route Frequency Ordered Stop   02/15/16 0000  levofloxacin (LEVAQUIN) 500 MG tablet     500 mg Oral Daily 02/15/16 1305 02/20/16 2359   02/14/16 1530  meropenem (MERREM) IVPB SOLR 1 g     1 g 100 mL/hr over 30 Minutes Intravenous Every 12 hours 02/14/16 1447     02/13/16 1300  levofloxacin (LEVAQUIN) IVPB 500 mg  Status:  Discontinued     500 mg 100 mL/hr over 60 Minutes Intravenous Every 24 hours 02/13/16 1151 02/14/16 1444   02/13/16 1000  cefTRIAXone (ROCEPHIN) IVPB 1 g  Status:  Discontinued     1 g 100 mL/hr over 30 Minutes Intravenous Every 24 hours 02/13/16 0949 02/13/16 1150   02/13/16 0945  cefTRIAXone (ROCEPHIN) 1 g in dextrose 5 % 50 mL IVPB  Status:  Discontinued     1 g 100 mL/hr over 30 Minutes Intravenous Every 24 hours 02/13/16 0944 02/13/16 0949   02/12/16 1700   ceFAZolin (ANCEF) IVPB 2g/100 mL premix     2 g 200 mL/hr over 30 Minutes Intravenous Every 6 hours 02/12/16 1435 02/12/16 2235   02/12/16 0955  ceFAZolin (ANCEF) 2-4 GM/100ML-% IVPB    Comments:  Slemenda, Debbie: cabinet override      02/12/16 0955 02/12/16 1119   02/11/16 2208  ceFAZolin (ANCEF) IVPB 2g/100 mL premix     2 g 200 mL/hr over 30 Minutes Intravenous 30 min pre-op 02/11/16 2208 02/12/16 1119      Medications   Scheduled Meds: . docusate sodium  100 mg Oral BID  . enoxaparin (LOVENOX) injection  40 mg Subcutaneous Q24H  . ferrous sulfate  325 mg Oral TID PC  . meropenem  1 g Intravenous Q12H  . midodrine  10 mg Oral TID WC  . senna  1 tablet Oral BID   Continuous Infusions: . sodium chloride 75 mL/hr (02/14/16 2101)   PRN Meds:.acetaminophen **OR** acetaminophen, alum & mag hydroxide-simeth, bisacodyl, ipratropium-albuterol, LORazepam, magnesium citrate, menthol-cetylpyridinium **OR** phenol, methocarbamol **OR** methocarbamol (ROBAXIN)  IV, morphine injection, ondansetron **OR** ondansetron (ZOFRAN) IV, oxyCODONE, polyethylene glycol, promethazine, traMADol   Data Review:   Micro Results Recent Results (from the past 240 hour(s))  Urine culture     Status: Abnormal   Collection Time: 02/11/16  6:51 PM  Result Value Ref Range Status   Specimen Description URINE, RANDOM  Final   Special Requests NONE  Final   Culture 40,000 COLONIES/mL ENTEROBACTER SPECIES (A)  Final   Report Status 02/14/2016 FINAL  Final   Organism ID, Bacteria ENTEROBACTER SPECIES (A)  Final      Susceptibility   Enterobacter species - MIC*    CEFAZOLIN >=64 RESISTANT Resistant     CEFTRIAXONE >=64 RESISTANT Resistant     CIPROFLOXACIN >=4 RESISTANT Resistant     GENTAMICIN <=1 SENSITIVE Sensitive     IMIPENEM <=0.25 SENSITIVE Sensitive  NITROFURANTOIN 64 INTERMEDIATE Intermediate     TRIMETH/SULFA <=20 SENSITIVE Sensitive     PIP/TAZO >=128 RESISTANT Resistant     * 40,000  COLONIES/mL ENTEROBACTER SPECIES  Surgical pcr screen     Status: Abnormal   Collection Time: 02/12/16 12:45 AM  Result Value Ref Range Status   MRSA, PCR NEGATIVE NEGATIVE Final   Staphylococcus aureus POSITIVE (A) NEGATIVE Final    Comment:        The Xpert SA Assay (FDA approved for NASAL specimens in patients over 42 years of age), is one component of a comprehensive surveillance program.  Test performance has been validated by [facility] for patients greater than or equal to 63 year old. It is not intended to diagnose infection nor to guide or monitor treatment.     Radiology Reports Dg Chest 1 View  Result Date: 02/13/2016 CLINICAL DATA:  Shortness of breath.  COPD. EXAM: CHEST 1 VIEW FINDINGS: The patient has slight interstitial pulmonary edema with Kerley B-lines at the right lung base. Tiny bilateral pleural effusions superimposed on COPD. Ill-defined area of of lung consolidation at the right lung base posterior medially. This has increased since the prior study consistent with progressive pneumonia. Heart size and pulmonary vascularity appear normal. Aortic atherosclerosis. IMPRESSION: Interstitial pulmonary edema with tiny bilateral effusions, superimposed on COPD. Probable progressing infiltrate at the right lung base consistent with pneumonia. Electronically Signed   By: Francene Boyers M.D.   On: 02/13/2016 11:11   Dg Chest 1 View  Result Date: 02/11/2016 CLINICAL DATA:  Fall with left hip fracture, preop. EXAM: CHEST 1 VIEW COMPARISON:  Most recent radiograph 06/17/2015. Most recent CT 06/07/2015 FINDINGS: Improved lung base aeration from prior radiographs, with residual or recurrent streaky opacity at the right lung base. Mild hyperinflation and emphysema. Left upper lobe nodule tentatively identified. Normal heart size and mediastinal contours. There is atherosclerosis of the aortic arch. No pulmonary edema, large pleural effusion or pneumothorax. Skin fold projects over  the left lung base. Remote right rib fractures. IMPRESSION: 1. Streaky right lung base opacity may be chronic scarring, however recurrent atelectasis, aspiration, or pneumonia not entirely excluded. 2. Emphysema.  Thoracic aortic atherosclerosis. 3. Left upper lobe nodule tentatively identified, as seen on prior chest CT. Recommend correlation with any interval imaging/intervention performed elsewhere. In the absence of interval imaging, recommend follow-up chest CT on a nonemergent basis for nodule re-evaluation. Electronically Signed   By: Rubye Oaks M.D.   On: 02/11/2016 18:59   Ct Hip Left Wo Contrast  Result Date: 02/11/2016 CLINICAL DATA:  Left hip pain and fracture after fall. Pre-surgical planning. EXAM: CT OF THE LEFT HIP WITHOUT CONTRAST; 3-DIMENSIONAL CT IMAGE RENDERING ON ACQUISITION WORKSTATION TECHNIQUE: Multidetector CT imaging of the left hip was performed according to the standard protocol. Multiplanar CT image reconstructions were also generated. 3D reformats were provided. COMPARISON:  Radiographs earlier this day. FINDINGS: Bones/Joint/Cartilage Impacted subcapital femoral neck fracture with mild apex anterior angulation. No articular or intertrochanteric component. Acetabulum and pubic rami are intact. Ligaments Suboptimally assessed by CT. Muscles and Tendons No fatty infiltration of the included musculature. Soft tissues Expected soft tissue stranding. Foley catheter in place. Mild vascular calcifications. 3D reformats confirm the above findings. IMPRESSION: Impacted left femoral neck fracture with mild angulation. Electronically Signed   By: Rubye Oaks M.D.   On: 02/11/2016 23:08   Ct 3d Recon At Scanner  Result Date: 02/11/2016 CLINICAL DATA:  Left hip pain and fracture after fall. Pre-surgical planning.  EXAM: CT OF THE LEFT HIP WITHOUT CONTRAST; 3-DIMENSIONAL CT IMAGE RENDERING ON ACQUISITION WORKSTATION TECHNIQUE: Multidetector CT imaging of the left hip was performed  according to the standard protocol. Multiplanar CT image reconstructions were also generated. 3D reformats were provided. COMPARISON:  Radiographs earlier this day. FINDINGS: Bones/Joint/Cartilage Impacted subcapital femoral neck fracture with mild apex anterior angulation. No articular or intertrochanteric component. Acetabulum and pubic rami are intact. Ligaments Suboptimally assessed by CT. Muscles and Tendons No fatty infiltration of the included musculature. Soft tissues Expected soft tissue stranding. Foley catheter in place. Mild vascular calcifications. 3D reformats confirm the above findings. IMPRESSION: Impacted left femoral neck fracture with mild angulation. Electronically Signed   By: Rubye Oaks M.D.   On: 02/11/2016 23:08   Dg Hip Port Unilat With Pelvis 1v Left  Result Date: 02/12/2016 CLINICAL DATA:  Left hip replacement. EXAM: DG HIP (WITH OR WITHOUT PELVIS) 1V PORT LEFT COMPARISON:  02/11/2016 FINDINGS: Left hip hemiarthroplasty in satisfactory position and alignment. No fracture or immediate complication. IMPRESSION: Satisfactory left hip hemiarthroplasty. Electronically Signed   By: Marlan Palau M.D.   On: 02/12/2016 14:18   Dg Hip Unilat With Pelvis 2-3 Views Left  Result Date: 02/11/2016 CLINICAL DATA:  Left hip pain after fall from standing position. Left leg shortening and rotation. EXAM: DG HIP (WITH OR WITHOUT PELVIS) 2-3V LEFT COMPARISON:  None. FINDINGS: Impacted fracture of the left subcapital femoral neck. No additional fracture of the bony pelvis. Pubic symphysis and sacroiliac joints are congruent. The bones appear under mineralized. IMPRESSION: Impacted left subcapital femoral neck fracture. Electronically Signed   By: Rubye Oaks M.D.   On: 02/11/2016 18:54     CBC  Recent Labs Lab 02/11/16 1823 02/12/16 0003 02/12/16 0408 02/13/16 0313 02/14/16 0959 02/15/16 0436  WBC 14.4* 20.7* 17.6* 20.3* 23.9* 16.8*  HGB 13.4 13.4 12.5 11.3* 10.0* 9.1*  HCT  40.5 41.4 38.0 34.4* 30.9* 27.7*  PLT 383 404 365 312 279 300  MCV 81.7 81.1 81.9 81.1 82.3 82.5  MCH 27.0 26.3 26.9 26.7 26.7 27.2  MCHC 33.1 32.4 32.8 32.9 32.4 32.9  RDW 15.2* 15.1* 15.1* 15.3* 15.2* 15.3*  LYMPHSABS 2.4 1.1  --   --  1.3  --   MONOABS 1.1* 1.4*  --   --  1.8*  --   EOSABS 0.4 0.1  --   --  0.3  --   BASOSABS 0.1 0.1  --   --  0.0  --     Chemistries   Recent Labs Lab 02/11/16 1823 02/12/16 0003 02/12/16 0408 02/13/16 0313 02/14/16 0959 02/15/16 0436  NA 139 141 140 137 140 139  K 3.6 3.5 3.4* 3.4* 3.2* 3.5  CL 101 106 106 103 109 107  CO2 32 28 28 27 27 25   GLUCOSE 95 124* 99 115* 103* 78  BUN 22* 19 16 14 14 13   CREATININE 0.91 0.74 0.68 0.76 0.70 0.57  CALCIUM 9.0 9.1 8.8* 8.3* 8.6* 8.2*  AST 18 18  --   --   --   --   ALT 12* 13*  --   --   --   --   ALKPHOS 95 94  --   --   --   --   BILITOT 0.4 0.9  --   --   --   --    ------------------------------------------------------------------------------------------------------------------ estimated creatinine clearance is 48.4 mL/min (by C-G formula based on SCr of 0.57 mg/dL). ------------------------------------------------------------------------------------------------------------------ No results for input(s): HGBA1C  in the last 72 hours. ------------------------------------------------------------------------------------------------------------------ No results for input(s): CHOL, HDL, LDLCALC, TRIG, CHOLHDL, LDLDIRECT in the last 72 hours. ------------------------------------------------------------------------------------------------------------------ No results for input(s): TSH, T4TOTAL, T3FREE, THYROIDAB in the last 72 hours.  Invalid input(s): FREET3 ------------------------------------------------------------------------------------------------------------------ No results for input(s): VITAMINB12, FOLATE, FERRITIN, TIBC, IRON, RETICCTPCT in the last 72 hours.  Coagulation  profile  Recent Labs Lab 02/11/16 1823 02/12/16 0003  INR 1.07 1.08    No results for input(s): DDIMER in the last 72 hours.  Cardiac Enzymes No results for input(s): CKMB, TROPONINI, MYOGLOBIN in the last 168 hours.  Invalid input(s): CK ------------------------------------------------------------------------------------------------------------------ Invalid input(s): POCBNP    Assessment & Plan  Patient is 82 year old status post left hip fracture 1.    COPD (chronic obstructive pulmonary disease) (HCC) no evidence of exasperation Continue when necessary nebulizer  2     acute hypoxia suspect this is related to aspiration pneumonia and possible CHFContinues to improve 3.   Hypotension much improved continue midrodrine 4.  Dementia  continue therapy with lorazepam as needed 5. Leukocytosis imrpoved with antibiotics follow tomm am 5. Miscellaneous recommend Lovenox for DVT prophylaxis      Code Status Orders        Start     Ordered   02/11/16 2316  Do not attempt resuscitation (DNR)  Continuous    Question Answer Comment  In the event of cardiac or respiratory ARREST Do not call a "code blue"   In the event of cardiac or respiratory ARREST Do not perform Intubation, CPR, defibrillation or ACLS   In the event of cardiac or respiratory ARREST Use medication by any route, position, wound care, and other measures to relive pain and suffering. May use oxygen, suction and manual treatment of airway obstruction as needed for comfort.      02/11/16 2316    Code Status History    Date Active Date Inactive Code Status Order ID Comments User Context   02/11/2016 10:06 PM 02/11/2016 10:07 PM Full Code [id number]  Juanell FairlyKevin Krasinski, MD ED   06/20/2015  4:53 PM 06/21/2015  8:10 PM DNR [id number]  Suan HalterMargaret F Campbell, MD Inpatient   06/19/2015  5:29 PM 06/20/2015  4:53 PM DNR [id number]  Suan HalterMargaret F Campbell, MD Inpatient   06/12/2015 10:26 AM 06/19/2015  5:29 PM DNR [id number]  Erin FullingKurian Kasa, MD  Inpatient   06/08/2015  2:51 AM 06/12/2015 10:26 AM Full Code [id number]  Houston SirenVivek J Sainani, MD ED           Consults  Orthopedics   DVT Prophylaxis  Lovenox  Lab Results  Component Value Date   PLT 300 02/15/2016     Time Spent in minutes   22min Greater than 50% of time spent in care coordination and counseling patient regarding the condition and plan of care.  Case discussed with her son likely discharge to skilled nursing facility tomorrow  Auburn BilberryEL, Dequavious Harshberger M.D on 02/15/2016 at 1:15 PM  Between 7am to 6pm - Pager - [phone]  After 6pm go to [email or web address] - password [email or web address] [facility]  [facility]   Office  [phone]

## 2016-02-16 LAB — CBC
HCT: 29.1 % — ABNORMAL LOW (ref 35.0–47.0)
HEMOGLOBIN: 9.6 g/dL — AB (ref 12.0–16.0)
MCH: 27.1 pg (ref 26.0–34.0)
MCHC: 33.1 g/dL (ref 32.0–36.0)
MCV: 82 fL (ref 80.0–100.0)
Platelets: 329 10*3/uL (ref 150–440)
RBC: 3.55 MIL/uL — ABNORMAL LOW (ref 3.80–5.20)
RDW: 14.6 % — AB (ref 11.5–14.5)
WBC: 13.6 10*3/uL — ABNORMAL HIGH (ref 3.6–11.0)

## 2016-02-16 LAB — BASIC METABOLIC PANEL
Anion gap: 4 — ABNORMAL LOW (ref 5–15)
BUN: 14 mg/dL (ref 6–20)
CALCIUM: 8.2 mg/dL — AB (ref 8.9–10.3)
CO2: 28 mmol/L (ref 22–32)
CREATININE: 0.59 mg/dL (ref 0.44–1.00)
Chloride: 109 mmol/L (ref 101–111)
Glucose, Bld: 90 mg/dL (ref 65–99)
Potassium: 3.3 mmol/L — ABNORMAL LOW (ref 3.5–5.1)
SODIUM: 141 mmol/L (ref 135–145)

## 2016-02-16 MED ORDER — LEVOFLOXACIN 500 MG PO TABS: 500.0000 mg | ORAL_TABLET | Freq: Every day | ORAL | 0 refills | Status: AC

## 2016-02-16 MED ORDER — POTASSIUM CHLORIDE CRYS ER 20 MEQ PO TBCR
40.0000 meq | EXTENDED_RELEASE_TABLET | Freq: Once | ORAL | Status: AC
  Administered 2016-02-16: 40 meq via ORAL
  Filled 2016-02-16: qty 2

## 2016-02-16 MED ORDER — ENOXAPARIN SODIUM 40 MG/0.4ML ~~LOC~~ SOLN: 40.0000 mg | SUBCUTANEOUS | 0 refills | Status: DC

## 2016-02-16 MED ORDER — MIDODRINE HCL 10 MG PO TABS: 10.0000 mg | ORAL_TABLET | Freq: Three times a day (TID) | ORAL | 0 refills | Status: DC

## 2016-02-16 MED ORDER — FUROSEMIDE 10 MG/ML IJ SOLN
40.0000 mg | Freq: Once | INTRAMUSCULAR | Status: AC
  Administered 2016-02-16: 40 mg via INTRAVENOUS
  Filled 2016-02-16 (×2): qty 4

## 2016-02-16 NOTE — Progress Notes (Signed)
On assessment patient alert and oriented to person (baseline). Patient eating a cup of chocolate pudding. Patient has some expiratory wheezing with non productive cough. Patient has honeycomb dressing on left hip with minimal serosanguinous drainage. Patient has hip abduction pillow in between legs to maintain hip precautions. Patient has had a bowel movement last night and ready for transfer back to [facility].    Report given to crystal at [facility]. All questions answered. EMS called for transport. Her son Siri ColeHerbert was called and notified by RN.   Harvie HeckMelanie Keirra Zeimet, RN

## 2016-02-16 NOTE — Clinical Social Work Note (Signed)
Patient to dc [facility] via non-emergent EMS due to AMS and recent hip fracture. The facility and her son Siri ColeHerbert are aware. CSW will con't to follow pending any additional dc needs.  Argentina PonderKaren Martha Zayneb Baucum, MSW, LCSW-A [phone]

## 2016-02-16 NOTE — Progress Notes (Signed)
Physical Therapy Treatment Patient Details Name: Leah [name] MRN: [id number] DOB: 12-02-1932 Today's Date: 02/16/2016    History of Present Illness Pt. is an [age] y.o. female [facility] Hemi[facility]ro[facility] repair after sustaining an impacted Left Subcapital Femoral Neck Fracture. pt. has a history of Dementia, COPD, HTN, respiratory failure, and Hypoxia.    PT Comments    Pt on bedside commode with nursing upon arrival.  Assisted with transfer back to bed with mod a x 2. Unable to ambulate further today. Participated in exercises as described below.  Pt awaiting discharge to SNF.  Pt limited by pain and cognition.   Follow Up Recommendations  SNF     Equipment Recommendations  Rolling walker with 5" wheels    Recommendations for Other Services       Precautions / Restrictions Precautions Precautions: Posterior Hip;Fall Restrictions Weight Bearing Restrictions: Yes LLE Weight Bearing: Weight bearing as tolerated    Mobility  Bed Mobility Overal bed mobility: Needs Assistance Bed Mobility: Sit to Supine       Sit to supine: Mod assist;+2 for physical assistance   General bed mobility comments: limited by hip pain and cognition  Transfers Overall transfer level: Needs assistance Equipment used: Rolling walker (2 wheeled) Transfers: Sit to/from Stand Sit to Stand: Mod assist;+2 physical assistance Stand pivot transfers: Mod assist;+2 physical assistance          Ambulation/Gait Ambulation/Gait assistance: Mod assist;+2 physical assistance Ambulation Distance (Feet): 3 Feet Assistive device: Rolling walker (2 wheeled) Gait Pattern/deviations: Step-to pattern Gait velocity: decreased   General Gait Details: antalgic; decreased stance time L LE; vc's for walker use and gait pattern/technique required consistently   Stairs            Wheelchair Mobility    Modified Rankin (Stroke Patients Only)       Balance    Sitting-balance support: Bilateral upper extremity supported;Feet supported Sitting balance-[name]: Fair   Postural control: Posterior lean Standing balance support: Bilateral upper extremity supported Standing balance-[name]: Fair                      Cognition Arousal/Alertness: Awake/alert Behavior During Therapy: WFL for tasks assessed/performed Overall Cognitive Status: Within Functional Limits for tasks assessed                 General Comments: [facility]    Exercises Total Joint Exercises Ankle Circles/Pumps: AROM;Strengthening;Both;20 reps;Supine Towel Squeeze: AROM;Strengthening;Supine;Left Short Arc Quad: AAROM;Strengthening;Supine;20 reps;Left Heel Slides: AAROM;Strengthening;Supine;20 reps;Left Hip ABduction/ADduction: AAROM;Strengthening;Supine;Left;20 reps Straight Leg Raises: AAROM;Strengthening;Left;20 reps;Supine    General Comments        Pertinent Vitals/Pain Pain Assessment: Faces Pain Score: 8  Pain Location: L hip with movement/WB'ing Pain Descriptors / Indicators: Aching;Sore;Operative site guarding    Home Living                      Prior Function            PT Goals (current goals can now be found in the care plan section) Progress towards PT goals: Progressing toward goals    Frequency    BID      PT Plan Current plan remains appropriate    Co-evaluation             End of Session Equipment Utilized During Treatment: Gait belt Activity Tolerance: Patient limited by pain;Patient limited by fatigue Patient left: in bed;with call  bell/phone within reach;with bed alarm set;with SCD's reapplied     Time: 1011-1025 PT Time Calculation (min) (ACUTE ONLY): 14 min  Charges:  $Therapeutic Exercise: 8-22 mins                    G Codes:      Danielle DessSarah Ariadna Setter 02/16/2016, 10:37 AM

## 2016-02-16 NOTE — Progress Notes (Signed)
Patient ID: Leah [name], female   DOB: 1932-07-22, 82 y.o.   MRN: [id number]  [facility] PROGRESS NOTE  Leah [name] WUJ:[id number] DOB: 1932-05-19 DOA: 02/11/2016 PCP: Jaclyn Shaggy, MD  HPI/Subjective: Patient answers some yes or no questions. Does not elaborate much. Does not complain of any pain.  Objective: Vitals:   02/16/16 0314 02/16/16 0716  BP: (!) 131/97 (!) 106/55  Pulse: 71 68  Resp: 19   Temp: 98.1 F (36.7 C) 97.5 F (36.4 C)    Filed Weights   02/11/16 1811 02/11/16 2316  Weight: 56.9 kg (125 lb 8 oz) 55.6 kg (122 lb 9.6 oz)    ROS: Review of Systems  Constitutional: Negative for chills and fever.  Eyes: Negative for blurred vision.  Respiratory: Negative for cough and shortness of breath.   Cardiovascular: Negative for chest pain.  Gastrointestinal: Negative for abdominal pain, constipation, diarrhea, nausea and vomiting.  Genitourinary: Negative for dysuria.  Musculoskeletal: Negative for joint pain.  Neurological: Negative for dizziness and headaches.   Exam: Physical Exam  HENT:  Nose: No mucosal edema.  Mouth/Throat: No oropharyngeal exudate or posterior oropharyngeal edema.  Eyes: Conjunctivae, EOM and lids are normal. Pupils are equal, round, and reactive to light.  Neck: No JVD present. Carotid bruit is not present. No edema present. No thyroid mass and no thyromegaly present.  Cardiovascular: S1 normal and S2 normal.  Exam reveals no gallop.   No murmur heard. Pulses:      Dorsalis pedis pulses are 2+ on the right side, and 2+ on the left side.  Respiratory: No respiratory distress. She has decreased breath sounds in the right middle field and the right lower field. She has wheezes in the right middle field, the right lower field and the left lower field. She has no rhonchi. She has no rales.  GI: Soft. Bowel sounds are normal. There is no tenderness.  Musculoskeletal:       Right ankle: She exhibits no swelling.       Left ankle:  She exhibits no swelling.  Lymphadenopathy:    She has no cervical adenopathy.  Neurological: She is alert.  Able to wiggle toes bilateral lower extremity  Skin: Skin is warm. No rash noted. Nails show no clubbing.  Psychiatric: She has a normal mood and affect.      Data Reviewed: Basic Metabolic Panel:  Recent Labs Lab 02/12/16 0408 02/13/16 0313 02/14/16 0959 02/15/16 0436 02/16/16 0400  NA 140 137 140 139 141  K 3.4* 3.4* 3.2* 3.5 3.3*  CL 106 103 109 107 109  CO2 28 27 27 25 28   GLUCOSE 99 115* 103* 78 90  BUN 16 14 14 13 14   CREATININE 0.68 0.76 0.70 0.57 0.59  CALCIUM 8.8* 8.3* 8.6* 8.2* 8.2*   Liver Function Tests:  Recent Labs Lab 02/11/16 1823 02/12/16 0003  AST 18 18  ALT 12* 13*  ALKPHOS 95 94  BILITOT 0.4 0.9  PROT 7.6 7.7  ALBUMIN 3.6 3.6   CBC:  Recent Labs Lab 02/11/16 1823 02/12/16 0003 02/12/16 0408 02/13/16 0313 02/14/16 0959 02/15/16 0436 02/16/16 0400  WBC 14.4* 20.7* 17.6* 20.3* 23.9* 16.8* 13.6*  NEUTROABS 10.5* 18.0*  --   --  20.4*  --   --   HGB 13.4 13.4 12.5 11.3* 10.0* 9.1* 9.6*  HCT 40.5 41.4 38.0 34.4* 30.9* 27.7* 29.1*  MCV 81.7 81.1 81.9 81.1 82.3 82.5 82.0  PLT 383 404 365 312 279 300 329  BNP (last 3 results)  Recent Labs  06/07/15 1132 02/13/16 0313  BNP 127.0* 95.0     CBG:  Recent Labs Lab 02/13/16 0736 02/13/16 1126 02/13/16 1610 02/13/16 2116  GLUCAP 87 124* 119* 131*    Recent Results (from the past 240 hour(s))  Urine culture     Status: Abnormal   Collection Time: 02/11/16  6:51 PM  Result Value Ref Range Status   Specimen Description URINE, RANDOM  Final   Special Requests NONE  Final   Culture 40,000 COLONIES/mL ENTEROBACTER SPECIES (A)  Final   Report Status 02/14/2016 FINAL  Final   Organism ID, Bacteria ENTEROBACTER SPECIES (A)  Final      Susceptibility   Enterobacter species - MIC*    CEFAZOLIN >=64 RESISTANT Resistant     CEFTRIAXONE >=64 RESISTANT Resistant      CIPROFLOXACIN >=4 RESISTANT Resistant     GENTAMICIN <=1 SENSITIVE Sensitive     IMIPENEM <=0.25 SENSITIVE Sensitive     NITROFURANTOIN 64 INTERMEDIATE Intermediate     TRIMETH/SULFA <=20 SENSITIVE Sensitive     PIP/TAZO >=128 RESISTANT Resistant     * 40,000 COLONIES/mL ENTEROBACTER SPECIES  Surgical pcr screen     Status: Abnormal   Collection Time: 02/12/16 12:45 AM  Result Value Ref Range Status   MRSA, PCR NEGATIVE NEGATIVE Final   Staphylococcus aureus POSITIVE (A) NEGATIVE Final    Comment:        The Xpert SA Assay (FDA approved for NASAL specimens in patients over 80 years of age), is one component of a comprehensive surveillance program.  Test performance has been validated by [facility] for patients greater than or equal to 80 year old. It is not intended to diagnose infection nor to guide or monitor treatment.      Scheduled Meds: . docusate sodium  100 mg Oral BID  . enoxaparin (LOVENOX) injection  40 mg Subcutaneous Q24H  . ferrous sulfate  325 mg Oral TID PC  . furosemide  40 mg Intravenous Once  . meropenem  1 g Intravenous Q12H  . midodrine  10 mg Oral TID WC  . potassium chloride  40 mEq Oral Once  . senna  1 tablet Oral BID    Assessment/Plan:  1. Pneumonia right lower lobe, history of COPD. Continue nebulizer treatments. Patient on meropenem here. Switch over to Levaquin upon disposition home. 2. Acute hypoxic respiratory failure. Likely secondary to pneumonia and fluid overload. Give 1 dose of IV Lasix this morning. Try to get off oxygen. 3. Hypotension. On midodrine. 4. Dementia without behavioral disturbance. On when necessary lorazepam 5. Leukocytosis. This continues to improve. 6. Anemia likely dilutional from hydration and also postop. Continue to monitor as outpatient 7. DVT prophylaxis with Lovenox for 14 days 8. Patient is a DO NOT RESUSCITATE 9. Hypokalemia replace potassium orally  Code Status:     Code Status Orders         Start     Ordered   02/11/16 2316  Do not attempt resuscitation (DNR)  Continuous    Question Answer Comment  In the event of cardiac or respiratory ARREST Do not call a "code blue"   In the event of cardiac or respiratory ARREST Do not perform Intubation, CPR, defibrillation or ACLS   In the event of cardiac or respiratory ARREST Use medication by any route, position, wound care, and other measures to relive pain and suffering. May use oxygen, suction and manual treatment of airway obstruction as needed  for comfort.      02/11/16 2316    Code Status History    Date Active Date Inactive Code Status Order ID Comments User Context   02/11/2016 10:06 PM 02/11/2016 10:07 PM Full Code [id number]  Juanell FairlyKevin Krasinski, MD ED   06/20/2015  4:53 PM 06/21/2015  8:10 PM DNR [id number]  Suan HalterMargaret F Campbell, MD Inpatient   06/19/2015  5:29 PM 06/20/2015  4:53 PM DNR [id number]  Suan HalterMargaret F Campbell, MD Inpatient   06/12/2015 10:26 AM 06/19/2015  5:29 PM DNR [id number]  Erin FullingKurian Kasa, MD Inpatient   06/08/2015  2:51 AM 06/12/2015 10:26 AM Full Code [id number]  Houston SirenVivek J Sainani, MD ED     Family Communication: Spoke with son on phone Disposition Plan: Back to rehabilitation  Consultants:  Orthopedic surgery  Antibiotics:  Switch to Levaquin upon discharge  Time spent: 38 minutes  Alford HighlandWIETING, Mikie Misner  [employer]

## 2017-06-18 ENCOUNTER — Other Ambulatory Visit: Payer: Self-pay

## 2017-06-18 ENCOUNTER — Inpatient Hospital Stay
Admission: EM | Admit: 2017-06-18 | Discharge: 2017-06-22 | DRG: 193 | Disposition: A | Discharge status: Skilled Nursing Facility | Payer: Medicare Other | Attending: Internal Medicine | Admitting: Internal Medicine

## 2017-06-18 ENCOUNTER — Emergency Department: Payer: Medicare Other

## 2017-06-18 ENCOUNTER — Encounter: Payer: Self-pay | Admitting: Internal Medicine

## 2017-06-18 DIAGNOSIS — Z87891 Personal history of nicotine dependence: Secondary | ICD-10-CM | POA: Diagnosis not present

## 2017-06-18 DIAGNOSIS — J9601 Acute respiratory failure with hypoxia: Secondary | ICD-10-CM | POA: Diagnosis present

## 2017-06-18 DIAGNOSIS — I10 Essential (primary) hypertension: Secondary | ICD-10-CM | POA: Diagnosis present

## 2017-06-18 DIAGNOSIS — J439 Emphysema, unspecified: Secondary | ICD-10-CM | POA: Diagnosis present

## 2017-06-18 DIAGNOSIS — Z96642 Presence of left artificial hip joint: Secondary | ICD-10-CM | POA: Diagnosis present

## 2017-06-18 DIAGNOSIS — Z66 Do not resuscitate: Secondary | ICD-10-CM | POA: Diagnosis present

## 2017-06-18 DIAGNOSIS — N319 Neuromuscular dysfunction of bladder, unspecified: Secondary | ICD-10-CM | POA: Diagnosis present

## 2017-06-18 DIAGNOSIS — G309 Alzheimer's disease, unspecified: Secondary | ICD-10-CM | POA: Diagnosis present

## 2017-06-18 DIAGNOSIS — F0280 Dementia in other diseases classified elsewhere without behavioral disturbance: Secondary | ICD-10-CM | POA: Diagnosis present

## 2017-06-18 DIAGNOSIS — F419 Anxiety disorder, unspecified: Secondary | ICD-10-CM | POA: Diagnosis present

## 2017-06-18 DIAGNOSIS — R05 Cough: Secondary | ICD-10-CM | POA: Diagnosis present

## 2017-06-18 DIAGNOSIS — J181 Lobar pneumonia, unspecified organism: Principal | ICD-10-CM | POA: Diagnosis present

## 2017-06-18 DIAGNOSIS — Y95 Nosocomial condition: Secondary | ICD-10-CM | POA: Diagnosis present

## 2017-06-18 DIAGNOSIS — Z9981 Dependence on supplemental oxygen: Secondary | ICD-10-CM

## 2017-06-18 DIAGNOSIS — R0902 Hypoxemia: Secondary | ICD-10-CM

## 2017-06-18 DIAGNOSIS — J189 Pneumonia, unspecified organism: Secondary | ICD-10-CM | POA: Diagnosis present

## 2017-06-18 DIAGNOSIS — Z7901 Long term (current) use of anticoagulants: Secondary | ICD-10-CM | POA: Diagnosis not present

## 2017-06-18 HISTORY — DX: Solitary pulmonary nodule: R91.1

## 2017-06-18 HISTORY — DX: Neuromuscular dysfunction of bladder, unspecified: N31.9

## 2017-06-18 HISTORY — DX: Unspecified fall, initial encounter: W19.XXXA

## 2017-06-18 HISTORY — DX: Anxiety disorder, unspecified: F41.9

## 2017-06-18 LAB — BASIC METABOLIC PANEL
ANION GAP: 7 (ref 5–15)
BUN: 22 mg/dL — ABNORMAL HIGH (ref 6–20)
CALCIUM: 9.3 mg/dL (ref 8.9–10.3)
CO2: 32 mmol/L (ref 22–32)
Chloride: 102 mmol/L (ref 101–111)
Creatinine, Ser: 0.78 mg/dL (ref 0.44–1.00)
Glucose, Bld: 110 mg/dL — ABNORMAL HIGH (ref 65–99)
POTASSIUM: 3.5 mmol/L (ref 3.5–5.1)
Sodium: 141 mmol/L (ref 135–145)

## 2017-06-18 LAB — LACTIC ACID, PLASMA
LACTIC ACID, VENOUS: 3 mmol/L — AB (ref 0.5–1.9)
Lactic Acid, Venous: 1.6 mmol/L (ref 0.5–1.9)

## 2017-06-18 LAB — PROCALCITONIN

## 2017-06-18 LAB — CBC WITH DIFFERENTIAL/PLATELET
BASOS ABS: 0.1 10*3/uL (ref 0–0.1)
BASOS PCT: 1 %
EOS PCT: 2 %
Eosinophils Absolute: 0.3 10*3/uL (ref 0–0.7)
HEMATOCRIT: 43.9 % (ref 35.0–47.0)
Hemoglobin: 14.3 g/dL (ref 12.0–16.0)
LYMPHS PCT: 11 %
Lymphs Abs: 1.7 10*3/uL (ref 1.0–3.6)
MCH: 27.2 pg (ref 26.0–34.0)
MCHC: 32.5 g/dL (ref 32.0–36.0)
MCV: 83.8 fL (ref 80.0–100.0)
Monocytes Absolute: 1.6 10*3/uL — ABNORMAL HIGH (ref 0.2–0.9)
Monocytes Relative: 10 %
NEUTROS ABS: 12.5 10*3/uL — AB (ref 1.4–6.5)
Neutrophils Relative %: 76 %
PLATELETS: 510 10*3/uL — AB (ref 150–440)
RBC: 5.24 MIL/uL — AB (ref 3.80–5.20)
RDW: 15.5 % — ABNORMAL HIGH (ref 11.5–14.5)
WBC: 16.2 10*3/uL — AB (ref 3.6–11.0)

## 2017-06-18 LAB — BRAIN NATRIURETIC PEPTIDE: B NATRIURETIC PEPTIDE 5: 94 pg/mL (ref 0.0–100.0)

## 2017-06-18 LAB — TROPONIN I

## 2017-06-18 MED ORDER — SODIUM CHLORIDE 0.9 % IV SOLN
1.0000 g | Freq: Once | INTRAVENOUS | Status: AC
  Administered 2017-06-18: 1 g via INTRAVENOUS
  Filled 2017-06-18: qty 1

## 2017-06-18 MED ORDER — IPRATROPIUM-ALBUTEROL 0.5-2.5 (3) MG/3ML IN SOLN: 3.0000 mL | Freq: Four times a day (QID) | RESPIRATORY_TRACT | Status: DC | PRN

## 2017-06-18 MED ORDER — ACETAMINOPHEN 650 MG RE SUPP: 650.0000 mg | Freq: Four times a day (QID) | RECTAL | Status: DC | PRN

## 2017-06-18 MED ORDER — UMECLIDINIUM-VILANTEROL 62.5-25 MCG/INH IN AEPB
1.0000 | INHALATION_SPRAY | Freq: Every day | RESPIRATORY_TRACT | Status: DC
  Administered 2017-06-19 – 2017-06-21 (×3): 1 via RESPIRATORY_TRACT
  Filled 2017-06-18: qty 14

## 2017-06-18 MED ORDER — ALBUTEROL SULFATE (2.5 MG/3ML) 0.083% IN NEBU
5.0000 mg | INHALATION_SOLUTION | Freq: Once | RESPIRATORY_TRACT | Status: AC
  Administered 2017-06-18: 5 mg via RESPIRATORY_TRACT
  Filled 2017-06-18: qty 6

## 2017-06-18 MED ORDER — METHYLPREDNISOLONE SODIUM SUCC 125 MG IJ SOLR
125.0000 mg | Freq: Once | INTRAMUSCULAR | Status: AC
  Administered 2017-06-18: 125 mg via INTRAVENOUS
  Filled 2017-06-18: qty 2

## 2017-06-18 MED ORDER — LORAZEPAM 0.5 MG PO TABS: 0.5000 mg | ORAL_TABLET | ORAL | Status: DC | PRN

## 2017-06-18 MED ORDER — GUAIFENESIN ER 600 MG PO TB12
600.0000 mg | ORAL_TABLET | Freq: Two times a day (BID) | ORAL | Status: DC
  Administered 2017-06-18 – 2017-06-22 (×8): 600 mg via ORAL
  Filled 2017-06-18 (×8): qty 1

## 2017-06-18 MED ORDER — VANCOMYCIN HCL IN DEXTROSE 1-5 GM/200ML-% IV SOLN
1000.0000 mg | Freq: Once | INTRAVENOUS | Status: AC
  Administered 2017-06-18: 1000 mg via INTRAVENOUS
  Filled 2017-06-18: qty 200

## 2017-06-18 MED ORDER — LORATADINE 10 MG PO TABS
10.0000 mg | ORAL_TABLET | Freq: Every day | ORAL | Status: DC
  Administered 2017-06-19 – 2017-06-22 (×4): 10 mg via ORAL
  Filled 2017-06-18 (×4): qty 1

## 2017-06-18 MED ORDER — BISACODYL 10 MG RE SUPP: 10.0000 mg | Freq: Every day | RECTAL | Status: DC | PRN

## 2017-06-18 MED ORDER — ACETAMINOPHEN 325 MG PO TABS: 650.0000 mg | ORAL_TABLET | ORAL | Status: DC | PRN

## 2017-06-18 MED ORDER — ENOXAPARIN SODIUM 40 MG/0.4ML ~~LOC~~ SOLN
40.0000 mg | SUBCUTANEOUS | Status: DC
  Administered 2017-06-18 – 2017-06-21 (×4): 40 mg via SUBCUTANEOUS
  Filled 2017-06-18 (×4): qty 0.4

## 2017-06-18 MED ORDER — VANCOMYCIN HCL IN DEXTROSE 750-5 MG/150ML-% IV SOLN
750.0000 mg | INTRAVENOUS | Status: DC
  Administered 2017-06-19 – 2017-06-20 (×2): 750 mg via INTRAVENOUS
  Filled 2017-06-18 (×2): qty 150

## 2017-06-18 MED ORDER — SENNOSIDES-DOCUSATE SODIUM 8.6-50 MG PO TABS
1.0000 | ORAL_TABLET | Freq: Two times a day (BID) | ORAL | Status: DC
  Administered 2017-06-18 – 2017-06-22 (×8): 1 via ORAL
  Filled 2017-06-18 (×8): qty 1

## 2017-06-18 MED ORDER — ONDANSETRON HCL 4 MG/2ML IJ SOLN: 4.0000 mg | Freq: Four times a day (QID) | INTRAMUSCULAR | Status: DC | PRN

## 2017-06-18 MED ORDER — ONDANSETRON HCL 4 MG PO TABS: 4.0000 mg | ORAL_TABLET | Freq: Four times a day (QID) | ORAL | Status: DC | PRN

## 2017-06-18 MED ORDER — SODIUM CHLORIDE 0.9 % IV SOLN
INTRAVENOUS | Status: AC
  Administered 2017-06-18: 22:00:00 via INTRAVENOUS

## 2017-06-18 MED ORDER — VITAMIN D 1000 UNITS PO TABS
1000.0000 [IU] | ORAL_TABLET | Freq: Every day | ORAL | Status: DC
  Administered 2017-06-19 – 2017-06-22 (×4): 1000 [IU] via ORAL
  Filled 2017-06-18 (×4): qty 1

## 2017-06-18 MED ORDER — SODIUM CHLORIDE 0.9 % IV SOLN
2.0000 g | INTRAVENOUS | Status: DC
  Administered 2017-06-19 – 2017-06-21 (×3): 2 g via INTRAVENOUS
  Filled 2017-06-18 (×4): qty 2

## 2017-06-18 MED ORDER — ACETAMINOPHEN 325 MG PO TABS: 650.0000 mg | ORAL_TABLET | Freq: Four times a day (QID) | ORAL | Status: DC | PRN

## 2017-06-18 NOTE — Progress Notes (Signed)
Pharmacy Antibiotic Note  Leah [name] is a [age] y.o. female admitted on 06/18/2017 with pneumonia.  Pharmacy has been consulted for vancomycin and cefepime dosing.  From nursing home  Plan: Patient received Vancomycin 1 gram IV x 1 in ER and Cefepime 1 gram IV x 1 in ER. Will continue with Vancomycin 750 IV every 18 hours.  Goal trough 15-20 mcg/mL.  Ke 0.044, t1/2 15.75  Vd 38.7   Will order Vanc trough prior to 5th dose. F/u MRSA PCR- would consider discontinuing Vancomycin if negative. 1st PCT= <0.10  Cefepime 2 gram IV q24h.     Height: 5\' 6"  (167.6 cm) Weight: 153 lb 14.1 oz (69.8 kg) IBW/kg (Calculated) : 59.3  Temp (24hrs), Avg:98.8 F (37.1 C), Min:98.7 F (37.1 C), Max:98.9 F (37.2 C)  Recent Labs  Lab 06/18/17 1605 06/18/17 1701  WBC 16.2*  --   CREATININE 0.78  --   LATICACIDVEN  --  1.6    Estimated Creatinine Clearance: 50.8 mL/min (by C-G formula based on SCr of 0.78 mg/dL).    No Known Allergies  Antimicrobials this admission: Vanc 4/11 >>   cefepime 4/11 >>    Dose adjustments this admission:    Microbiology results: 4/11 BCx: pending   UCx:      Sputum:    4/11 MRSA PCR: pending  Thank you for allowing pharmacy to be a part of this patient's care.  Sheran Newstrom A 06/18/2017 8:38 PM

## 2017-06-18 NOTE — ED Provider Notes (Addendum)
[facility] Emergency Department Provider Note  ____________________________________________   I have reviewed the triage vital signs and the nursing notes. Where available I have reviewed prior notes and, if possible and indicated, outside hospital notes.    HISTORY  Chief Complaint No chief complaint on file.    HPI Leah [name] is a 84 y.o. female presents today complaining of cough.  Patient is very demented and is at her baseline according to family.  According to EMS, and nursing home facility, she is been coughing and feeling generally weak for the last couple days.  She has not had any known fevers or chills.  Level 5 chart caveat; no further history available due to patient status.  EMS states that she had a negative strokes being on the way in but they were concerned about her oxygen saturations which were running in the 80s.  Patient is on currently 1 L at night possibly of oxygen.  Past Medical History:  Diagnosis Date  . COPD (chronic obstructive pulmonary disease) (HCC)   . Dementia   . Essential hypertension     Patient Active Problem List   Diagnosis Date Noted  . Closed left hip fracture (HCC) 02/11/2016  . COPD (chronic obstructive pulmonary disease) (HCC) 02/11/2016  . HTN (hypertension) 02/11/2016  . Dementia 02/11/2016  . Pressure ulcer 06/21/2015  . Acute on chronic respiratory failure with hypoxia and hypercapnia (HCC)   . Acute respiratory failure (HCC)   . Pneumonia 06/08/2015  . Sepsis (HCC) 06/07/2015    Past Surgical History:  Procedure Laterality Date  . HIP ARTHROPLASTY Left 02/12/2016   Procedure: ARTHROPLASTY BIPOLAR HIP (HEMIARTHROPLASTY);  Surgeon: Juanell Fairly, MD;  Location: [facility] ORS;  Service: Orthopedics;  Laterality: Left;  . NO PAST SURGERIES      Prior to Admission medications   Medication Sig Start Date End Date Taking? Authorizing Provider  acetaminophen (TYLENOL) 325 MG tablet Take 2 tablets (650  mg total) by mouth every 4 (four) hours as needed for mild pain or fever. 06/20/15   Suan Halter, MD  bisacodyl (DULCOLAX) 10 MG suppository Place 1 suppository (10 mg total) rectally daily as needed for moderate constipation. 06/20/15   Suan Halter, MD  enoxaparin (LOVENOX) 40 MG/0.4ML injection Inject 0.4 mLs (40 mg total) into the skin daily. 02/16/16 03/01/16  Alford Highland, MD  ipratropium-albuterol (DUONEB) 0.5-2.5 (3) MG/3ML SOLN Take 3 mLs by nebulization every 2 (two) hours as needed. 06/20/15   Suan Halter, MD  LORazepam (ATIVAN) 0.5 MG tablet Take 1 tablet (0.5 mg total) by mouth every 4 (four) hours as needed for anxiety. 02/15/16   Auburn Bilberry, MD  midodrine (PROAMATINE) 10 MG tablet Take 1 tablet (10 mg total) by mouth 3 (three) times daily with meals. 02/16/16   Alford Highland, MD  Morphine Sulfate (MORPHINE CONCENTRATE) 10 MG/0.5ML SOLN concentrated solution Take 0.25 mLs (5 mg total) by mouth every hour as needed for moderate pain, severe pain or shortness of breath. 02/15/16   Auburn Bilberry, MD  prochlorperazine (COMPAZINE) 25 MG suppository Place 1 suppository (25 mg total) rectally every 8 (eight) hours as needed for nausea or vomiting. 06/20/15   Suan Halter, MD  senna-docusate (SENOKOT-S) 8.6-50 MG tablet Take 1 tablet by mouth 2 (two) times daily. 06/20/15   Suan Halter, MD    Allergies Patient has no known allergies.  Family History  Family history unknown: Yes    Social History Social History   Tobacco  Use  . Smoking status: Current Every Day Smoker    Packs/day: 1.00    Years: 60.00    Pack years: 60.00    Types: Cigarettes  . Smokeless tobacco: Never Used  Substance Use Topics  . Alcohol use: No    Alcohol/week: 0.0 oz  . Drug use: No    Review of Systems History limited by severe dementia     ____________________________________________   PHYSICAL EXAM:  VITAL SIGNS: ED Triage Vitals [06/18/17  1540]  Enc Vitals Group     BP (!) 143/60     Pulse Rate 75     Resp (!) 33     Temp      Temp src      SpO2 90 %     Weight      Height      Head Circumference      Peak Flow      Pain Score      Pain Loc      Pain Edu?      Excl. in GC?     Constitutional: Alert and oriented to name only. Well appearing and in no acute distress. Eyes: Conjunctivae are normal Head: Atraumatic HEENT: No congestion/rhinnorhea. Mucous membranes are moist.  Oropharynx non-erythematous Neck:   Nontender with no meningismus, no masses, no stridor Cardiovascular: Normal rate, regular rhythm. Grossly normal heart sounds.  Good peripheral circulation. Respiratory: Increased respiratory effort, diffuse rhonchi appreciated especially on the right Abdominal: Soft and nontender. No distention. No guarding no rebound Back:  There is no focal tenderness or step off.  there is no midline tenderness there are no lesions noted. there is no CVA tenderness Musculoskeletal: No lower extremity tenderness, no upper extremity tenderness. No joint effusions, no DVT signs strong distal pulses no edema Neurologic:  Normal speech and language. No gross focal neurologic deficits are appreciated.  Skin:  Skin is warm, dry and intact. No rash noted. Psychiatric: Mood and affect are normal. Speech and behavior are normal.  ____________________________________________   LABS (all labs ordered are listed, but only abnormal results are displayed)  Labs Reviewed  CBC WITH DIFFERENTIAL/PLATELET - Abnormal; Notable for the following components:      Result Value   WBC 16.2 (*)    RBC 5.24 (*)    RDW 15.5 (*)    Platelets 510 (*)    Neutro Abs 12.5 (*)    Monocytes Absolute 1.6 (*)    All other components within normal limits  CULTURE, BLOOD (ROUTINE X 2)  CULTURE, BLOOD (ROUTINE X 2)  TROPONIN I  BRAIN NATRIURETIC PEPTIDE  BASIC METABOLIC PANEL  URINALYSIS, COMPLETE (UACMP) WITH MICROSCOPIC  LACTIC ACID, PLASMA   LACTIC ACID, PLASMA    Pertinent labs  results that were available during my care of the patient were reviewed by me and considered in my medical decision making (see chart for details). ____________________________________________  EKG  I personally interpreted any EKGs ordered by me or triage Sinus rhythm rate 95 bpm, PVCs noted PACs noted, right bundle branch block, LAFB noted.  No acute ischemia ____________________________________________  RADIOLOGY  Pertinent labs & imaging results that were available during my care of the patient were reviewed by me and considered in my medical decision making (see chart for details). If possible, patient and/or family made aware of any abnormal findings.  Dg Chest 2 View  Result Date: 06/18/2017 CLINICAL DATA:  Hypertension.  Altered mental status EXAM: CHEST - 2 VIEW COMPARISON:  February 13, 2016 FINDINGS: There is patchy infiltrate in the right middle lobe. There is mild generalized interstitial edema. Heart is upper normal in size with pulmonary vascularity within normal limits. There is aortic atherosclerosis. No adenopathy. Bones are somewhat osteoporotic. IMPRESSION: Patchy infiltrate right middle lobe concerning for pneumonia. Mild interstitial edema which may be of noncardiogenic etiology given absence of cardiomegaly and normal pulmonary vascularity. There is aortic atherosclerosis. Aortic Atherosclerosis (ICD10-I70.0). Electronically Signed   By: Bretta Bang III M.D.   On: 06/18/2017 16:58   ____________________________________________    PROCEDURES  Procedure(s) performed: None  Procedures  Critical Care performed: CRITICAL CARE Performed by: Jeanmarie Plant   Total critical care time: 42 minutes  Critical care time was exclusive of separately billable procedures and treating other patients.  Critical care was necessary to treat or prevent imminent or life-threatening deterioration.  Critical care was time spent  personally by me on the following activities: development of treatment plan with patient and/or surrogate as well as nursing, discussions with consultants, evaluation of patient's response to treatment, examination of patient, obtaining history from patient or surrogate, ordering and performing treatments and interventions, ordering and review of laboratory studies, ordering and review of radiographic studies, pulse oximetry and re-evaluation of patient's condition.   ____________________________________________   INITIAL IMPRESSION / ASSESSMENT AND PLAN / ED COURSE  Pertinent labs & imaging results that were available during my care of the patient were reviewed by me and considered in my medical decision making (see chart for details).  Patient here with rhonchorous breath sounds and low sats, consistent with possible pneumonia.  Chest x-ray does show a right lower lobe pneumonia.  We are giving her hospital-acquired pneumonia protocol she does come from a facility told.  Patient will require admission to the hospital.  Blood work is still pending.  We have given her steroids for her COPD as well as breathing treatments.    ____________________________________________   FINAL CLINICAL IMPRESSION(S) / ED DIAGNOSES  Final diagnoses:  None      This chart was dictated using voice recognition software.  Despite best efforts to proofread,  errors can occur which can change meaning.      Jeanmarie Plant, MD 06/18/17 1730    Jeanmarie Plant, MD 06/18/17 1730

## 2017-06-18 NOTE — H&P (Signed)
[facility] [place] at [facility]   PATIENT NAME: Leah [name]    MR#:  [id number]  DATE OF BIRTH:  1932-12-06  DATE OF ADMISSION:  06/18/2017  PRIMARY CARE PHYSICIAN: [facility]   REQUESTING/REFERRING PHYSICIAN: Dr. Ileana Roup  CHIEF COMPLAINT:  No chief complaint on file.   HISTORY OF PRESENT ILLNESS:  Leah [name]  is a 84 y.o. female with a known history of COPD on nocturnal home oxygen, severe Alzheimer's dementia, hypertension, neurogenic bladder was brought in from nursing home secondary to respiratory distress and cough and hypoxia. Patient is not a good historian due to her underlying dementia.  Most of the history is obtained from ER physician and also patient's son at bedside.  At baseline patient is in a wheelchair, son is not aware of any aspiration history.  She is on a soft diet with thin liquids.  She uses nocturnal home oxygen.  For the last couple of days has been having worsening cough and congested breath sounds.  Today was noted to have slurred speech, generalized weakness and was noted to be hypoxic with 73% sats on room air.  Chest x-ray in the ER shows right-sided pneumonia.  Also elevated white count present.  PAST MEDICAL HISTORY:   Past Medical History:  Diagnosis Date  . Anxiety   . COPD (chronic obstructive pulmonary disease) (HCC)   . Dementia   . Essential hypertension   . Fall   . Neurogenic bladder   . Pulmonary nodule     PAST SURGICAL HISTORY:   Past Surgical History:  Procedure Laterality Date  . HIP ARTHROPLASTY Left 02/12/2016   Procedure: ARTHROPLASTY BIPOLAR HIP (HEMIARTHROPLASTY);  Surgeon: Juanell Fairly, MD;  Location: [facility] ORS;  Service: Orthopedics;  Laterality: Left;    SOCIAL HISTORY:   Social History   Tobacco Use  . Smoking status: Current Every Day Smoker    Packs/day: 1.00    Years: 60.00    Pack years: 60.00    Types: Cigarettes  . Smokeless tobacco: Never Used  . Tobacco  comment: quit a year ago  Substance Use Topics  . Alcohol use: No    Alcohol/week: 0.0 oz    FAMILY HISTORY:   Family History  Problem Relation Age of Onset  . Dementia Mother     DRUG ALLERGIES:  No Known Allergies  REVIEW OF SYSTEMS:   Review of Systems  Unable to perform ROS: Dementia    MEDICATIONS AT HOME:   Prior to Admission medications   Medication Sig Start Date End Date Taking? Authorizing Provider  acetaminophen (TYLENOL) 325 MG tablet Take 2 tablets (650 mg total) by mouth every 4 (four) hours as needed for mild pain or fever. 06/20/15  Yes Suan Halter, MD  bisacodyl (DULCOLAX) 10 MG suppository Place 1 suppository (10 mg total) rectally daily as needed for moderate constipation. 06/20/15  Yes Suan Halter, MD  Cholecalciferol (VITAMIN D-3) 5000 units TABS Take 1 tablet by mouth daily.   Yes [provider]  ipratropium-albuterol (DUONEB) 0.5-2.5 (3) MG/3ML SOLN Take 3 mLs by nebulization every 2 (two) hours as needed. Patient taking differently: Inhale 3ml via nebulizer twice daily as scheduled and every 2 hours as needed for shortness of breath and COPD symptoms 06/20/15  Yes Suan Halter, MD  loratadine (CLARITIN) 10 MG tablet Take 10 mg by mouth daily.   Yes [provider]  senna-docusate (SENOKOT-S) 8.6-50 MG tablet Take 1 tablet by mouth 2 (two)  times daily. 06/20/15  Yes Suan Halter, MD  umeclidinium-vilanterol [facility] ELLIPTA) 62.5-25 MCG/INH AEPB Inhale 1 puff into the lungs daily.   Yes [provider]  enoxaparin (LOVENOX) 40 MG/0.4ML injection Inject 0.4 mLs (40 mg total) into the skin daily. 02/16/16 03/01/16  Alford Highland, MD  LORazepam (ATIVAN) 0.5 MG tablet Take 1 tablet (0.5 mg total) by mouth every 4 (four) hours as needed for anxiety. Patient not taking: Reported on 06/18/2017 02/15/16   Auburn Bilberry, MD  midodrine (PROAMATINE) 10 MG tablet Take 1 tablet (10 mg total) by mouth 3 (three)  times daily with meals. Patient not taking: Reported on 06/18/2017 02/16/16   Alford Highland, MD  Morphine Sulfate (MORPHINE CONCENTRATE) 10 MG/0.5ML SOLN concentrated solution Take 0.25 mLs (5 mg total) by mouth every hour as needed for moderate pain, severe pain or shortness of breath. Patient not taking: Reported on 06/18/2017 02/15/16   Auburn Bilberry, MD  prochlorperazine (COMPAZINE) 25 MG suppository Place 1 suppository (25 mg total) rectally every 8 (eight) hours as needed for nausea or vomiting. Patient not taking: Reported on 06/18/2017 06/20/15   Suan Halter, MD      VITAL SIGNS:  Blood pressure (!) 143/61, pulse 83, temperature 98.9 F (37.2 C), temperature source Oral, resp. rate (!) 28, SpO2 92 %.  PHYSICAL EXAMINATION:   Physical Exam  GENERAL:  84 y.o.-year-old patient lying in the bed with no acute distress.  EYES: Pupils equal, round, reactive to light and accommodation. No scleral icterus. Extraocular muscles intact.  HEENT: Head atraumatic, normocephalic. Oropharynx and nasopharynx clear.  NECK:  Supple, no jugular venous distention. No thyroid enlargement, no tenderness.  LUNGS: Normal breath sounds bilaterally, no wheezing, rales  or crepitation.  Rhonchorous breath sounds worse on the left, decreased at the right base.  No use of accessory muscles of respiration.  CARDIOVASCULAR: S1, S2 normal. No  rubs, or gallops. 3/6 systolic murmur present ABDOMEN: Soft, nontender, nondistended. Bowel sounds present. No organomegaly or mass.  EXTREMITIES: No pedal edema, cyanosis, or clubbing.  NEUROLOGIC: Cranial nerves II through XII are intact. Muscle strength 5/5 in upper extremities and 4/5 in both lower extremities. Sensation intact. Gait not checked.  PSYCHIATRIC: The patient is alert, disoriented SKIN: No obvious rash, lesion, or ulcer.   LABORATORY PANEL:   CBC Recent Labs  Lab 06/18/17 1605  WBC 16.2*  HGB 14.3  HCT 43.9  PLT 510*    ------------------------------------------------------------------------------------------------------------------  Chemistries  Recent Labs  Lab 06/18/17 1605  NA 141  K 3.5  CL 102  CO2 32  GLUCOSE 110*  BUN 22*  CREATININE 0.78  CALCIUM 9.3   ------------------------------------------------------------------------------------------------------------------  Cardiac Enzymes Recent Labs  Lab 06/18/17 1605  TROPONINI <0.03   ------------------------------------------------------------------------------------------------------------------  RADIOLOGY:  Dg Chest 2 View  Result Date: 06/18/2017 CLINICAL DATA:  Hypertension.  Altered mental status EXAM: CHEST - 2 VIEW COMPARISON:  February 13, 2016 FINDINGS: There is patchy infiltrate in the right middle lobe. There is mild generalized interstitial edema. Heart is upper normal in size with pulmonary vascularity within normal limits. There is aortic atherosclerosis. No adenopathy. Bones are somewhat osteoporotic. IMPRESSION: Patchy infiltrate right middle lobe concerning for pneumonia. Mild interstitial edema which may be of noncardiogenic etiology given absence of cardiomegaly and normal pulmonary vascularity. There is aortic atherosclerosis. Aortic Atherosclerosis (ICD10-I70.0). Electronically Signed   By: Bretta Bang III M.D.   On: 06/18/2017 16:58    EKG:   Orders placed or performed during the hospital encounter  of 06/18/17  . ED EKG  . ED EKG    IMPRESSION AND PLAN:   Ellah SwazilandJordan  is a 84 y.o. female with a known history of COPD on nocturnal home oxygen, severe Alzheimer's dementia, hypertension, neurogenic bladder was brought in from nursing home secondary to respiratory distress and cough and hypoxia.  1.  Acute hypoxic respiratory failure-secondary to right middle lobe pneumonia, also has rhonchorous breath sounds on the left on exam -Admit, MRSA PCR, blood cultures -Started vancomycin and cefepime -Speech  therapy to rule out aspiration -Cough medications ordered. -Supplemental oxygen as needed  2.  COPD-stable, no wheezing actively. -Nebs as needed and continue inhalers.  No indication for systemic steroids at this time  3.  Severe dementia-seems to be at baseline.  Minimal interaction, alert but disoriented.  Usually wheelchair-bound at baseline  4.  DVT prophylaxis-Lovenox  Social worker consult for discharge back to nursing home    All the records are reviewed and case discussed with ED provider. Management plans discussed with the patient, family and they are in agreement.  CODE STATUS: DNR  TOTAL TIME TAKING CARE OF THIS PATIENT: 50 minutes.    Enid BaasKALISETTI,Jolon Degante M.D on 06/18/2017 at 6:49 PM  Between 7am to 6pm - Pager - [phone]  After 6pm go to [email or web address] - Social research officer, governmentpassword EPAS [facility]  [facility] [facility]  Office  [phone]  CC: Primary care physician; [facility]

## 2017-06-19 LAB — LACTIC ACID, PLASMA
Lactic Acid, Venous: 3.7 mmol/L (ref 0.5–1.9)
Lactic Acid, Venous: 2.2 mmol/L (ref 0.5–1.9)

## 2017-06-19 LAB — BASIC METABOLIC PANEL
Anion gap: 9 (ref 5–15)
BUN: 24 mg/dL — ABNORMAL HIGH (ref 6–20)
CHLORIDE: 107 mmol/L (ref 101–111)
CO2: 23 mmol/L (ref 22–32)
CREATININE: 0.9 mg/dL (ref 0.44–1.00)
Calcium: 8.7 mg/dL — ABNORMAL LOW (ref 8.9–10.3)
GFR calc Af Amer: >60 mL/min (ref >60)
GFR calc non Af Amer: 58 mL/min — ABNORMAL LOW (ref >60)
GLUCOSE: 209 mg/dL — AB (ref 65–99)
Potassium: 3.7 mmol/L (ref 3.5–5.1)
Sodium: 139 mmol/L (ref 135–145)

## 2017-06-19 LAB — CBC
HCT: 39.3 % (ref 35.0–47.0)
Hemoglobin: 12.6 g/dL (ref 12.0–16.0)
MCH: 26.7 pg (ref 26.0–34.0)
MCHC: 32 g/dL (ref 32.0–36.0)
MCV: 83.5 fL (ref 80.0–100.0)
PLATELETS: 425 10*3/uL (ref 150–440)
RBC: 4.71 MIL/uL (ref 3.80–5.20)
RDW: 15.5 % — AB (ref 11.5–14.5)
WBC: 20.4 10*3/uL — ABNORMAL HIGH (ref 3.6–11.0)

## 2017-06-19 LAB — MRSA PCR SCREENING: MRSA by PCR: NEGATIVE

## 2017-06-19 MED ORDER — ENSURE ENLIVE PO LIQD
237.0000 mL | Freq: Two times a day (BID) | ORAL | Status: DC
  Administered 2017-06-19 – 2017-06-22 (×4): 237 mL via ORAL

## 2017-06-19 MED ORDER — ADULT MULTIVITAMIN W/MINERALS CH
1.0000 | ORAL_TABLET | Freq: Every day | ORAL | Status: DC
  Administered 2017-06-20 – 2017-06-22 (×3): 1 via ORAL
  Filled 2017-06-19 (×3): qty 1

## 2017-06-19 NOTE — Progress Notes (Signed)
Notified Dr. Tobi BastosPyreddy of lactic acid of 2.2. No new orders at this time. Will monitor pt

## 2017-06-19 NOTE — Progress Notes (Signed)
Initial Nutrition Assessment  DOCUMENTATION CODES:   Not applicable  INTERVENTION:   Ensure Enlive po BID, each supplement provides 350 kcal and 20 grams of protein  MVI daily  Dysphagia 3 diet   NUTRITION DIAGNOSIS:   Inadequate oral intake related to acute illness as evidenced by meal completion < 25%.  GOAL:   Patient will meet greater than or equal to 90% of their needs  MONITOR:   PO intake, Supplement acceptance, Labs, Weight trends, I & O's  REASON FOR ASSESSMENT:   Low Braden    ASSESSMENT:   84 y.o. female with a known history of COPD on nocturnal home oxygen, severe Alzheimer's dementia, hypertension, neurogenic bladder was brought in from nursing home secondary to respiratory distress and cough and hypoxia.   Visited pt's room today. Unable to obtain nutrition related history as pt with dementia. No family at bedside. No recent wt history in chart to determine if any wt loss. Pt ate only a few bites of her breakfast this morning. RD will add supplements and MVI. Pt requires dysphagia 3 diet r/t dentition.   Medications reviewed and include: vitamin D, lovenox, senokot, cefepime, vancomycin  Labs reviewed: BUN 24(H), Ca 8.7(L) Wbc- 20.4(H)  NUTRITION - FOCUSED PHYSICAL EXAM:    Most Recent Value  Orbital Region  No depletion  Upper Arm Region  No depletion  Thoracic and Lumbar Region  No depletion  Buccal Region  No depletion  Temple Region  No depletion  Clavicle Bone Region  No depletion  Clavicle and Acromion Bone Region  No depletion  Scapular Bone Region  No depletion  Dorsal Hand  No depletion  Patellar Region  Moderate depletion  Anterior Thigh Region  Moderate depletion  Posterior Calf Region  Moderate depletion  Edema (RD Assessment)  None  Hair  Reviewed  Eyes  Reviewed  Mouth  Reviewed  Skin  Reviewed  Nails  Reviewed     Diet Order:  DIET SOFT Room service appropriate? Yes; Fluid consistency: Thin  EDUCATION NEEDS:   Not  appropriate for education at this time  Skin: Reviewed RN Assessment  Last BM:  pta  Height:   Ht Readings from Last 1 Encounters:  06/18/17 5\' 6"  (1.676 m)    Weight:   Wt Readings from Last 1 Encounters:  06/18/17 153 lb 14.1 oz (69.8 kg)    Ideal Body Weight:  59 kg  BMI:  Body mass index is 24.84 kg/m.  Estimated Nutritional Needs:   Kcal:  1400-1600kcal/day   Protein:  70-77g/day   Fluid:  >1.5L/day   Betsey Holidayasey Ivy Meriwether MS, RD, LDN Pager #[phone]- [phone] After Hours Pager: [phone]

## 2017-06-19 NOTE — Progress Notes (Signed)
MD notified of critical lactic acid. Orders to recheck in 3 hours. Will continue to monitor

## 2017-06-19 NOTE — Evaluation (Signed)
Clinical/Bedside Swallow Evaluation Patient Details  Name: Leah [name] MRN: [id number] Date of Birth: 08/06/1932  Today's Date: 06/19/2017 Time: SLP Start Time (ACUTE ONLY): 1520 SLP Stop Time (ACUTE ONLY): 1620 SLP Time Calculation (min) (ACUTE ONLY): 60 min  Past Medical History:  Past Medical History:  Diagnosis Date  . Anxiety   . COPD (chronic obstructive pulmonary disease) (HCC)   . Dementia   . Essential hypertension   . Fall   . Neurogenic bladder   . Pulmonary nodule    Past Surgical History:  Past Surgical History:  Procedure Laterality Date  . HIP ARTHROPLASTY Left 02/12/2016   Procedure: ARTHROPLASTY BIPOLAR HIP (HEMIARTHROPLASTY);  Surgeon: Juanell Fairly, MD;  Location: [facility] ORS;  Service: Orthopedics;  Laterality: Left;   HPI:  Pt is a 84 y.o. female with a known history of COPD on nocturnal home oxygen, severe Alzheimer's dementia, hypertension, neurogenic bladder was brought in from nursing home secondary to respiratory distress and cough and hypoxia. Patient is not a good historian due to her underlying dementia.  Most of the history is obtained from ER physician and also patient's son at bedside.  At baseline patient is in a wheelchair, son is not aware of any aspiration history.  She is on a soft diet with thin liquids.  She uses nocturnal home oxygen.  For the last couple of days has been having worsening cough and congested breath sounds.  Today was noted to have slurred speech, generalized weakness and was noted to be hypoxic with 73% sats on room air.  Chest x-ray in the ER shows right-sided pneumonia; also elevated white count present. Pt has a h/o previous CXRs in 2017 w/ right lung base opacities felt may be chronic scarring, Emphysema.    Assessment / Plan / Recommendation Clinical Impression  Pt appears to present w/ oropharyngeal phase dyspahgia and risk for aspiration overall d/t pt's declined Cognitive status/Dementia at baseline. In addition, pt is  more of a dependent feeder requiring full feeding assistance at meals - pt did not use her RUE. During the oral phase, pt was fed trials of Nectar liquids, purees and softened solids - more Minced. No overt s/s of aspiration were noted w/ the trials; pt was min impulsive when drinking intermittently but overall it was adequate. No overt coughing or throat clearing noted; no decline in respiratory effort from her baseline. During the oral phase, pt exhibited adequate bolus control w/ trials; fairly timely A-P transfer and swallow w/ full clearing noted. W/ increased textured trials(MINCED foods), pt required min more time and effort to fully masticate and clear the trials - suspect d/t her declined Cognitive status/Dementia. Pt required feeding assistance. Recommend a Dysphagia level 2 (MINCED foods) w/ NECTAR liquids; strict aspiration precautions; feeding support at all meals. Recommend Pills in Puree CRUSHED for safer, easier swallowing. Recommend f/u w/ Dietician for nutritional support. ST services will f/u w/ toleration of diet and trials to upgrade diet consistency if appropriate for pt. SLP Visit Diagnosis: Dysphagia, oropharyngeal phase (R13.12)    Aspiration Risk  Mild aspiration risk    Diet Recommendation  Dysphagia level 2 (MINCED foods) w/ NECTAR liquids; aspiration precautions; feeding support at all meals w/ reduced distractions during meals. Allow pt to help hold cup for drinking. Dietician f/u.   Medication Administration: Crushed with puree(as able) for safer swallowing   Other  Recommendations Recommended Consults: (Dietician f/u) Oral Care Recommendations: Oral care BID;Staff/trained caregiver to provide oral care Other Recommendations: Order thickener from pharmacy;Prohibited  food (jello, ice cream, thin soups);Remove water pitcher;Have oral suction available   Follow up Recommendations Skilled Nursing facility      Frequency and Duration min 3x week  2 weeks        Prognosis Prognosis for Safe Diet Advancement: Fair(-Good) Barriers to Reach Goals: Cognitive deficits;Severity of deficits      Swallow Study   General Date of Onset: 06/18/17 HPI: Pt is a 84 y.o. female with a known history of COPD on nocturnal home oxygen, severe Alzheimer's dementia, hypertension, neurogenic bladder was brought in from nursing home secondary to respiratory distress and cough and hypoxia. Patient is not a good historian due to her underlying dementia.  Most of the history is obtained from ER physician and also patient's son at bedside.  At baseline patient is in a wheelchair, son is not aware of any aspiration history.  She is on a soft diet with thin liquids.  She uses nocturnal home oxygen.  For the last couple of days has been having worsening cough and congested breath sounds.  Today was noted to have slurred speech, generalized weakness and was noted to be hypoxic with 73% sats on room air.  Chest x-ray in the ER shows right-sided pneumonia; also elevated white count present. Pt has a h/o previous CXRs in 2017 w/ right lung base opacities felt may be chronic scarring, Emphysema.  Type of Study: Bedside Swallow Evaluation Previous Swallow Assessment: none reported Diet Prior to this Study: Dysphagia 3 (soft);Thin liquids Temperature Spikes Noted: No(wbc 20.4) Respiratory Status: Nasal cannula(4 liters but pt not wearing it upon entering room) History of Recent Intubation: No Behavior/Cognition: Cooperative;Pleasant mood;Confused;Distractible;Requires cueing;Doesn't follow directions(awake) Oral Cavity Assessment: Within Functional Limits(fair) Oral Care Completed by SLP: Recent completion by staff Oral Cavity - Dentition: Poor condition;Missing dentition Vision: Functional for self-feeding Self-Feeding Abilities: Able to feed self;Needs assist;Needs set up;Total assist Patient Positioning: Upright in bed Baseline Vocal Quality: (nonverbal) Volitional Cough:  Cognitively unable to elicit Volitional Swallow: Unable to elicit    Oral/Motor/Sensory Function Overall Oral Motor/Sensory Function: Within functional limits(grossly wfl w/ bolus management)   Ice Chips Ice chips: Not tested   Thin Liquid Thin Liquid: Not tested    Nectar Thick Nectar Thick Liquid: Within functional limits Presentation: Cup;Self Fed;Straw(4 ozs)   Honey Thick Honey Thick Liquid: Not tested   Puree Puree: Within functional limits Presentation: Self Fed;Spoon(assisted; 4 ozs)   Solid   GO   Solid: Impaired(mech soft - MINCED more) Presentation: Self Fed(assisted; 4 trials) Oral Phase Impairments: Impaired mastication(min increased time) Oral Phase Functional Implications: Prolonged oral transit;Impaired mastication(min) Pharyngeal Phase Impairments: (none) Other Comments: given time, pt cleared appropriately         Jerilynn SomKatherine Watson, MS, CCC-SLP Watson,Katherine 06/19/2017,5:12 PM

## 2017-06-19 NOTE — Progress Notes (Signed)
[facility] - [place] at [facility]                                                                                                                                                                                  Patient Demographics   Leah [name], is a 84 y.o. female, DOB - 21-May-1932, ZOX:[id number]  Admit date - 06/18/2017   Admitting Physician Enid Baasadhika Kalisetti, MD  Outpatient Primary MD for the patient is [facility]   LOS - 1  Subjective: Patient seen and evaluated by me today On oxygen via nasal cannula at 4 L Awake and responds to verbal commands Decreased shortness of breath No cough No fever overnight  Review of Systems:  Could not be obtained secondary to dementia  Vitals:   Vitals:   06/18/17 2308 06/19/17 0526 06/19/17 0554 06/19/17 0640  BP:  (!) 81/71 (!) 118/49 (!) 125/52  Pulse: 74 69 (!) 59 67  Resp:  20  20  Temp:  97.9 F (36.6 C)  97.8 F (36.6 C)  TempSrc:  Oral  Oral  SpO2: 94% 94%  95%  Weight:      Height:        Wt Readings from Last 3 Encounters:  06/18/17 69.8 kg (153 lb 14.1 oz)  02/11/16 55.6 kg (122 lb 9.6 oz)  06/18/15 56.7 kg (125 lb)     Intake/Output Summary (Last 24 hours) at 06/19/2017 1216 Last data filed at 06/19/2017 0554 Gross per 24 hour  Intake 689 ml  Output -  Net 689 ml    Physical Exam:   GENERAL: Elderly female patient lying on the bed on oxygen via nasal cannula at 4 L HEAD, EYES, EARS, NOSE AND THROAT: Atraumatic, normocephalic. Extraocular muscles are intact. Pupils equal and reactive to light. Sclerae anicteric. No conjunctival injection. No oro-pharyngeal erythema.  NECK: Supple. There is no jugular venous distention. No bruits, no lymphadenopathy, no thyromegaly.  HEART: Regular rate and rhythm,. No murmurs, no rubs, no clicks.  LUNGS: Improved airflow in both lungs Decreased rales in the right lung ABDOMEN: Soft, flat, nontender, nondistended. Has good bowel sounds. No  hepatosplenomegaly appreciated.  EXTREMITIES: No evidence of any cyanosis, clubbing, or peripheral edema.  +2 pedal and radial pulses bilaterally.  NEUROLOGIC: The patient is alert, awake, and oriented x1 with no focal motor or sensory deficits appreciated bilaterally.  Has dementia SKIN: Moist and warm with no rashes appreciated.  Psych: Not anxious, depressed LN: No inguinal LN enlargement    Antibiotics   Anti-infectives (From admission, onward)   Start     Dose/Rate Route Frequency Ordered Stop   06/19/17 1800  ceFEPIme (MAXIPIME) 2 g  in sodium chloride 0.9 % 100 mL IVPB     2 g 200 mL/hr over 30 Minutes Intravenous Every 24 hours 06/18/17 2038     06/19/17 0900  vancomycin (VANCOCIN) IVPB 750 mg/150 ml premix     750 mg 150 mL/hr over 60 Minutes Intravenous Every 18 hours 06/18/17 2034     06/18/17 1730  vancomycin (VANCOCIN) IVPB 1000 mg/200 mL premix     1,000 mg 200 mL/hr over 60 Minutes Intravenous  Once 06/18/17 1704 06/18/17 2000   06/18/17 1715  ceFEPIme (MAXIPIME) 1 g in sodium chloride 0.9 % 100 mL IVPB     1 g 200 mL/hr over 30 Minutes Intravenous  Once 06/18/17 1700 06/18/17 1925      Medications   Scheduled Meds: . cholecalciferol  1,000 Units Oral Daily  . enoxaparin (LOVENOX) injection  40 mg Subcutaneous Q24H  . feeding supplement (ENSURE ENLIVE)  237 mL Oral BID BM  . guaiFENesin  600 mg Oral BID  . loratadine  10 mg Oral Daily  . multivitamin with minerals  1 tablet Oral Daily  . senna-docusate  1 tablet Oral BID  . umeclidinium-vilanterol  1 puff Inhalation Daily   Continuous Infusions: . sodium chloride 60 mL/hr at 06/18/17 2148  . ceFEPime (MAXIPIME) IV    . Vancomycin Stopped (06/19/17 1058)   PRN Meds:.acetaminophen **OR** acetaminophen, bisacodyl, ipratropium-albuterol, LORazepam, ondansetron **OR** ondansetron (ZOFRAN) IV   Data Review:   Micro Results Recent Results (from the past 240 hour(s))  Culture, blood (routine x 2)     Status:  None (Preliminary result)   Collection Time: 06/18/17  5:58 PM  Result Value Ref Range Status   Specimen Description BLOOD LT HAND  Final   Special Requests   Final    BOTTLES DRAWN AEROBIC AND ANAEROBIC Blood Culture adequate volume   Culture   Final    NO GROWTH < 24 HOURS Performed at [facility], [address] [place], [place] [address]    Report Status PENDING  Incomplete  Culture, blood (routine x 2)     Status: None (Preliminary result)   Collection Time: 06/18/17  5:58 PM  Result Value Ref Range Status   Specimen Description BLOOD RT WRIST  Final   Special Requests   Final    BOTTLES DRAWN AEROBIC AND ANAEROBIC Blood Culture adequate volume   Culture   Final    NO GROWTH < 24 HOURS Performed at [facility], [address], [place], [place] [address]    Report Status PENDING  Incomplete  MRSA PCR Screening     Status: None   Collection Time: 06/19/17  6:00 AM  Result Value Ref Range Status   MRSA by PCR NEGATIVE NEGATIVE Final    Comment:        The GeneXpert MRSA Assay (FDA approved for NASAL specimens only), is one component of a comprehensive MRSA colonization surveillance program. It is not intended to diagnose MRSA infection nor to guide or monitor treatment for MRSA infections. Performed at [facility], [address], [place], [place] [address]     Radiology Reports Dg Chest 2 View  Result Date: 06/18/2017 CLINICAL DATA:  Hypertension.  Altered mental status EXAM: CHEST - 2 VIEW COMPARISON:  February 13, 2016 FINDINGS: There is patchy infiltrate in the right middle lobe. There is mild generalized interstitial edema. Heart is upper normal in size with pulmonary vascularity within normal limits. There is aortic atherosclerosis. No adenopathy. Bones are somewhat  osteoporotic. IMPRESSION: Patchy infiltrate right middle lobe concerning for pneumonia. Mild interstitial edema which may be of noncardiogenic etiology given absence of  cardiomegaly and normal pulmonary vascularity. There is aortic atherosclerosis. Aortic Atherosclerosis (ICD10-I70.0). Electronically Signed   By: Bretta Bang III M.D.   On: 06/18/2017 16:58     CBC Recent Labs  Lab 06/18/17 1605 06/19/17 0125  WBC 16.2* 20.4*  HGB 14.3 12.6  HCT 43.9 39.3  PLT 510* 425  MCV 83.8 83.5  MCH 27.2 26.7  MCHC 32.5 32.0  RDW 15.5* 15.5*  LYMPHSABS 1.7  --   MONOABS 1.6*  --   EOSABS 0.3  --   BASOSABS 0.1  --     Chemistries  Recent Labs  Lab 06/18/17 1605 06/19/17 0125  NA 141 139  K 3.5 3.7  CL 102 107  CO2 32 23  GLUCOSE 110* 209*  BUN 22* 24*  CREATININE 0.78 0.90  CALCIUM 9.3 8.7*   ------------------------------------------------------------------------------------------------------------------ estimated creatinine clearance is 45.1 mL/min (by C-G formula based on SCr of 0.9 mg/dL). ------------------------------------------------------------------------------------------------------------------ No results for input(s): HGBA1C in the last 72 hours. ------------------------------------------------------------------------------------------------------------------ No results for input(s): CHOL, HDL, LDLCALC, TRIG, CHOLHDL, LDLDIRECT in the last 72 hours. ------------------------------------------------------------------------------------------------------------------ No results for input(s): TSH, T4TOTAL, T3FREE, THYROIDAB in the last 72 hours.  Invalid input(s): FREET3 ------------------------------------------------------------------------------------------------------------------ No results for input(s): VITAMINB12, FOLATE, FERRITIN, TIBC, IRON, RETICCTPCT in the last 72 hours.  Coagulation profile No results for input(s): INR, PROTIME in the last 168 hours.  No results for input(s): DDIMER in the last 72 hours.  Cardiac Enzymes Recent Labs  Lab 06/18/17 1605  TROPONINI <0.03    ------------------------------------------------------------------------------------------------------------------ Invalid input(s): POCBNP    Assessment & Plan   Elderly 84 year old female patient with history of COPD on nocturnal home oxygen, Alzheimer's dementia, hypertension, neurogenic bladder was brought from the nursing home facility for cough, hypoxia.  1.  Respiratory distress with hypoxia Continue oxygen via nasal cannula currently on 4 L Wean oxygen possible Follow-up blood cultures Continue IV vancomycin and cefepime antibiotic  2.  Emphysema Nebulization treatments as needed  3.  Healthcare associated pneumonia Continue IV vancomycin and cefepime antibiotics  4.  Advanced dementia Supportive care  5. Leucocytosis F/u wbc count  6. DVT prophylaxis with [place] lovenox    Code Status Orders  (From admission, onward)        Start     Ordered   06/18/17 2023  Do not attempt resuscitation (DNR)  Continuous    Question Answer Comment  In the event of cardiac or respiratory ARREST Do not call a "code blue"   In the event of cardiac or respiratory ARREST Do not perform Intubation, CPR, defibrillation or ACLS   In the event of cardiac or respiratory ARREST Use medication by any route, position, wound care, and other measures to relive pain and suffering. May use oxygen, suction and manual treatment of airway obstruction as needed for comfort.      06/18/17 2022    Code Status History    Date Active Date Inactive Code Status Order ID Comments User Context   02/11/2016 2316 02/16/2016 1412 DNR [id number]  Oralia Manis, MD Inpatient   02/11/2016 2206 02/11/2016 2207 Full Code [id number]  Juanell Fairly, MD ED   06/20/2015 1653 06/21/2015 2010 DNR [id number]  Suan Halter, MD Inpatient   06/19/2015 1729 06/20/2015 1653 DNR [id number]  Suan Halter, MD Inpatient   06/12/2015 1026 06/19/2015 1729 DNR [id number]  Erin Fulling, MD Inpatient  06/08/2015 0251 06/12/2015  1026 Full Code [id number]  Houston Siren, MD ED      Time Spent in minutes   36  Greater than 50% of time spent in care coordination and counseling patient regarding the condition and plan of care.   Ihor Austin M.D on 06/19/2017 at 12:16 PM  Between 7am to 6pm - Pager - [phone]  After 6pm go to [email or web address] - Social research officer, government  [facility]   Office  [phone]

## 2017-06-20 LAB — CBC
HCT: 38.7 % (ref 35.0–47.0)
Hemoglobin: 12.8 g/dL (ref 12.0–16.0)
MCH: 27.5 pg (ref 26.0–34.0)
MCHC: 33 g/dL (ref 32.0–36.0)
MCV: 83.3 fL (ref 80.0–100.0)
PLATELETS: 450 10*3/uL — AB (ref 150–440)
RBC: 4.64 MIL/uL (ref 3.80–5.20)
RDW: 14.9 % — AB (ref 11.5–14.5)
WBC: 19.9 10*3/uL — ABNORMAL HIGH (ref 3.6–11.0)

## 2017-06-20 NOTE — Progress Notes (Signed)
[facility] - [place] at [facility]   PATIENT NAME: Leah [name]    MR#:  [id number]  DATE OF BIRTH:  01-08-1933  SUBJECTIVE:  CHIEF COMPLAINT:  No chief complaint on file.  -Lying in bed, has congested cough.  Unable to interact, has significant dementia  REVIEW OF SYSTEMS:  Review of Systems  Unable to perform ROS: Dementia    DRUG ALLERGIES:  No Known Allergies  VITALS:  Blood pressure (!) 154/68, pulse (!) 53, temperature 98.3 F (36.8 C), resp. rate 16, height 5\' 6"  (1.676 m), weight 73.3 kg (161 lb 9.6 oz), SpO2 97 %.  PHYSICAL EXAMINATION:  Physical Exam  GENERAL:  84 y.o.-year-old elderly patient lying in the bed.  EYES: Pupils equal, round, reactive to light and accommodation. No scleral icterus. Extraocular muscles intact.  HEENT: Head atraumatic, normocephalic. Oropharynx and nasopharynx clear.  NECK:  Supple, no jugular venous distention. No thyroid enlargement, no tenderness.  LUNGS: Normal breath sounds bilaterally, decreased at the bases with coarse rhonchi.  No wheezing, rales or crepitation. No use of accessory muscles of respiration.  CARDIOVASCULAR: S1, S2 normal. No rubs, or gallops.  2/6 systolic murmur is present ABDOMEN: Soft, nontender, nondistended. Bowel sounds present. No organomegaly or mass.  EXTREMITIES: No pedal edema, cyanosis, or clubbing.  NEUROLOGIC: unable to follow commands, moving all extremities in bed  PSYCHIATRIC: The patient is alert but disoriented SKIN: No obvious rash, lesion, or ulcer.    LABORATORY PANEL:   CBC Recent Labs  Lab 06/20/17 0607  WBC 19.9*  HGB 12.8  HCT 38.7  PLT 450*   ------------------------------------------------------------------------------------------------------------------  Chemistries  Recent Labs  Lab 06/19/17 0125  NA 139  K 3.7  CL 107  CO2 23  GLUCOSE 209*  BUN 24*  CREATININE 0.90  CALCIUM 8.7*    ------------------------------------------------------------------------------------------------------------------  Cardiac Enzymes Recent Labs  Lab 06/18/17 1605  TROPONINI <0.03   ------------------------------------------------------------------------------------------------------------------  RADIOLOGY:  Dg Chest 2 View  Result Date: 06/18/2017 CLINICAL DATA:  Hypertension.  Altered mental status EXAM: CHEST - 2 VIEW COMPARISON:  February 13, 2016 FINDINGS: There is patchy infiltrate in the right middle lobe. There is mild generalized interstitial edema. Heart is upper normal in size with pulmonary vascularity within normal limits. There is aortic atherosclerosis. No adenopathy. Bones are somewhat osteoporotic. IMPRESSION: Patchy infiltrate right middle lobe concerning for pneumonia. Mild interstitial edema which may be of noncardiogenic etiology given absence of cardiomegaly and normal pulmonary vascularity. There is aortic atherosclerosis. Aortic Atherosclerosis (ICD10-I70.0). Electronically Signed   By: Bretta BangWilliam  Woodruff III M.D.   On: 06/18/2017 16:58    EKG:   Orders placed or performed during the hospital encounter of 06/18/17  . ED EKG  . ED EKG    ASSESSMENT AND PLAN:   Leah [name]  is a 84 y.o. female with a known history of COPD on nocturnal home oxygen, severe Alzheimer's dementia, hypertension, neurogenic bladder was brought in from nursing home secondary to respiratory distress and cough and hypoxia.  1.  Acute hypoxic respiratory failure-secondary to right middle lobe pneumonia, also has rhonchorous breath sounds on the left on exam -Blood cultures are negative, MRSA PCR is negative. -Discontinue vancomycin.  Continue cefepime  -appreciate speech therapy consult.  Started on dysphagia diet with nectar thick liquids -Cough medications ordered. -Supplemental oxygen as needed-currently on 2 L  2.  COPD-stable, no wheezing actively. -Nebs as needed and continue  inhalers.  No indication for systemic steroids at this time  3.  Severe dementia-seems to be at baseline.  Minimal interaction, alert and disoriented.  Usually wheelchair-bound at baseline  4.  DVT prophylaxis-Lovenox  Social worker consult for discharge back to nursing home If no improvement over the weekend, consider palliative care consult for hospice evaluation   All the records are reviewed and case discussed with Care Management/Social Workerr. Management plans discussed with the patient, family and they are in agreement.  CODE STATUS: DNR  TOTAL TIME TAKING CARE OF THIS PATIENT: 37 minutes.   POSSIBLE D/C IN ? DAYS, DEPENDING ON CLINICAL CONDITION.   Enid Baas M.D on 06/20/2017 at 12:02 PM  Between 7am to 6pm - Pager - [phone]  After 6pm go to [email or web address] - Social research officer, government  [facility] [place] [facility]  Office  [phone]  CC: Primary care physician; [facility]

## 2017-06-20 NOTE — Clinical Social Work Note (Signed)
The CSW is aware that the patient has admitted from [facility] and may require hospice evaluation should she not show progress over the weekend. The CSW will assess when able.  Argentina PonderKaren Martha Azir Muzyka, MSW, Theresia MajorsLCSWA [phone]

## 2017-06-21 ENCOUNTER — Inpatient Hospital Stay: Payer: Medicare Other

## 2017-06-21 LAB — CBC
HEMATOCRIT: 38.9 % (ref 35.0–47.0)
Hemoglobin: 12.7 g/dL (ref 12.0–16.0)
MCH: 27 pg (ref 26.0–34.0)
MCHC: 32.7 g/dL (ref 32.0–36.0)
MCV: 82.7 fL (ref 80.0–100.0)
PLATELETS: 379 10*3/uL (ref 150–440)
RBC: 4.7 MIL/uL (ref 3.80–5.20)
RDW: 15.1 % — AB (ref 11.5–14.5)
WBC: 12.5 10*3/uL — ABNORMAL HIGH (ref 3.6–11.0)

## 2017-06-21 LAB — BASIC METABOLIC PANEL
Anion gap: 5 (ref 5–15)
BUN: 17 mg/dL (ref 6–20)
CALCIUM: 8.6 mg/dL — AB (ref 8.9–10.3)
CO2: 30 mmol/L (ref 22–32)
CREATININE: 0.8 mg/dL (ref 0.44–1.00)
Chloride: 105 mmol/L (ref 101–111)
GFR calc non Af Amer: >60 mL/min (ref >60)
Glucose, Bld: 91 mg/dL (ref 65–99)
Potassium: 3.5 mmol/L (ref 3.5–5.1)
Sodium: 140 mmol/L (ref 135–145)

## 2017-06-21 NOTE — Progress Notes (Signed)
[facility] - [place] at [facility]   PATIENT NAME: Leah [name]    MR#:  [id number]  DATE OF BIRTH:  1932-09-03  SUBJECTIVE:  CHIEF COMPLAINT:  No chief complaint on file.  -Lying in bed, has congested cough.  Unable to interact, has significant dementia - no change from yesterday  REVIEW OF SYSTEMS:  Review of Systems  Unable to perform ROS: Dementia    DRUG ALLERGIES:  No Known Allergies  VITALS:  Blood pressure 93/67, pulse (!) 50, temperature 97.9 F (36.6 C), temperature source Oral, resp. rate 17, height 5\' 6"  (1.676 m), weight 73.3 kg (161 lb 9.6 oz), SpO2 93 %.  PHYSICAL EXAMINATION:  Physical Exam  GENERAL:  84 y.o.-year-old elderly patient lying in the bed.  EYES: Pupils equal, round, reactive to light and accommodation. No scleral icterus. Extraocular muscles intact.  HEENT: Head atraumatic, normocephalic. Oropharynx and nasopharynx clear.  NECK:  Supple, no jugular venous distention. No thyroid enlargement, no tenderness.  LUNGS: Normal breath sounds bilaterally, decreased at the bases with coarse rhonchi. congested cough. No wheezing, rales or crepitation. No use of accessory muscles of respiration.  CARDIOVASCULAR: S1, S2 normal. No rubs, or gallops.  2/6 systolic murmur is present ABDOMEN: Soft, nontender, nondistended. Bowel sounds present. No organomegaly or mass.  EXTREMITIES: No pedal edema, cyanosis, or clubbing.  NEUROLOGIC: unable to follow commands, moving all extremities in bed  PSYCHIATRIC: The patient is alert but disoriented SKIN: No obvious rash, lesion, or ulcer.    LABORATORY PANEL:   CBC Recent Labs  Lab 06/21/17 0641  WBC 12.5*  HGB 12.7  HCT 38.9  PLT 379   ------------------------------------------------------------------------------------------------------------------  Chemistries  Recent Labs  Lab 06/21/17 0641  NA 140  K 3.5  CL 105  CO2 30  GLUCOSE 91  BUN 17  CREATININE 0.80  CALCIUM 8.6*    ------------------------------------------------------------------------------------------------------------------  Cardiac Enzymes Recent Labs  Lab 06/18/17 1605  TROPONINI <0.03   ------------------------------------------------------------------------------------------------------------------  RADIOLOGY:  Dg Chest 2 View  Result Date: 06/21/2017 CLINICAL DATA:  congested cough. Unable to interact, has significant dementia Per chart: smoker with known COPD EXAM: CHEST - 2 VIEW COMPARISON:  Chest x-rays dated 06/18/2017 and 02/13/2016. FINDINGS: Stable mild cardiomegaly. New focal opacities within the LEFT upper lung, questionable associated spiculation. Additional ill-defined opacity within the LEFT lower lung. Small LEFT pleural effusion, possible small RIGHT pleural effusion. No pneumothorax seen. No acute or suspicious osseous finding. IMPRESSION: 1. New focal opacities within the LEFT upper lung, with questionable associated spiculation, concerning for neoplastic nodules, alternatively early developing pneumonia or focal atelectasis. Would consider chest CT for further characterization. 2. Ill-defined opacity at the LEFT lung base, pneumonia versus atelectasis. 3. Small LEFT pleural effusion. Possible small RIGHT pleural effusion. 4. Stable cardiomegaly. Electronically Signed   By: Bary Richard M.D.   On: 06/21/2017 09:31    EKG:   Orders placed or performed during the hospital encounter of 06/18/17  . ED EKG  . ED EKG    ASSESSMENT AND PLAN:   Leah [name]  is a 84 y.o. female with a known history of COPD on nocturnal home oxygen, severe Alzheimer's dementia, hypertension, neurogenic bladder was brought in from nursing home secondary to respiratory distress and cough and hypoxia.  1.  Acute hypoxic respiratory failure-secondary to right middle lobe pneumonia, also has rhonchorous breath sounds on the left on exam -Blood cultures are negative, MRSA PCR is  negative. -Discontinued vancomycin.  On cefepime - change to augmentin  tomorrow  -appreciate speech therapy consult.  Started on dysphagia diet with nectar thick liquids -Cough medications ordered. -Supplemental oxygen as needed-currently on 2 L  2.  COPD-stable, no wheezing actively. -Nebs as needed and continue inhalers.  No indication for systemic steroids at this time  3.  Severe dementia-seems to be at baseline.  Minimal interaction, alert and disoriented.  Usually wheelchair-bound at baseline  4.  DVT prophylaxis-Lovenox  Social worker consult for discharge back to nursing home If no improvement over the weekend, consider palliative care at [facility] for hospice evaluation   All the records are reviewed and case discussed with Care Management/Social Workerr. Management plans discussed with the patient, family and they are in agreement.  CODE STATUS: DNR  TOTAL TIME TAKING CARE OF THIS PATIENT: 36 minutes.   POSSIBLE D/C IN ? DAYS, DEPENDING ON CLINICAL CONDITION.   Enid BaasKALISETTI,Taiana Temkin M.D on 06/21/2017 at 10:58 AM  Between 7am to 6pm - Pager - [phone]  After 6pm go to [email or web address] - Social research officer, governmentpassword EPAS [facility]  [facility] [place] [facility]  Office  [phone]  CC: Primary care physician; [facility]

## 2017-06-21 NOTE — [place] FL2 (Signed)
[place] MEDICAID FL2 LEVEL OF CARE SCREENING TOOL     IDENTIFICATION  Patient Name: Leah [name] Birthdate: 10/05/1932 Sex: female Admission Date (Current Location): 06/18/2017  [place] and [place] Number:  [address] [id number] [facility] Facility and Address:  [facility], [address] [place], [place] [address]      Provider Number: [id number]  Attending Physician Name and Address:  Enid Baas, MD  Relative Name and Phone Number:  Herbert [name] (son) [phone]    Current Level of Care: Hospital Recommended Level of Care: Skilled Nursing Facility Prior Approval Number:    Date Approved/Denied:   PASRR Number: [id number]  Discharge Plan: SNF    Current Diagnoses: Patient Active Problem List   Diagnosis Date Noted  . HCAP (healthcare-associated pneumonia) 06/18/2017  . Closed left hip fracture (HCC) 02/11/2016  . COPD (chronic obstructive pulmonary disease) (HCC) 02/11/2016  . HTN (hypertension) 02/11/2016  . Dementia 02/11/2016  . Pressure ulcer 06/21/2015  . Acute on chronic respiratory failure with hypoxia and hypercapnia (HCC)   . Acute respiratory failure (HCC)   . Pneumonia 06/08/2015  . Sepsis (HCC) 06/07/2015    Orientation RESPIRATION BLADDER Height & Weight     Self  O2(3L o2) Incontinent Weight: 161 lb 9.6 oz (73.3 kg) Height:  5\' 6"  (167.6 cm)  BEHAVIORAL SYMPTOMS/MOOD NEUROLOGICAL BOWEL NUTRITION STATUS      Incontinent Diet(Dysphagia 3, nectar thick liquids)  AMBULATORY STATUS COMMUNICATION OF NEEDS Skin   Total Care Verbally Normal                       Personal Care Assistance Level of Assistance  Bathing, Feeding, Dressing Bathing Assistance: Maximum assistance Feeding assistance: Limited assistance Dressing Assistance: Maximum assistance     Functional Limitations Info  Hearing   Hearing Info: Impaired      SPECIAL CARE FACTORS FREQUENCY                       Contractures  Contractures Info: Not present    Additional Factors Info  Code Status, Allergies Code Status Info: DNR Allergies Info: No Known Allergies           Current Medications (06/21/2017):  This is the current hospital active medication list Current Facility-Administered Medications  Medication Dose Route Frequency Provider Last Rate Last Dose  . acetaminophen (TYLENOL) tablet 650 mg  650 mg Oral Q6H PRN Enid Baas, MD       Or  . acetaminophen (TYLENOL) suppository 650 mg  650 mg Rectal Q6H PRN Enid Baas, MD      . bisacodyl (DULCOLAX) suppository 10 mg  10 mg Rectal Daily PRN Enid Baas, MD      . ceFEPIme (MAXIPIME) 2 g in sodium chloride 0.9 % 100 mL IVPB  2 g Intravenous Q24H Enid Baas, MD   Stopped at 06/21/17 0343  . cholecalciferol (VITAMIN D) tablet 1,000 Units  1,000 Units Oral Daily Enid Baas, MD   1,000 Units at 06/21/17 1012  . enoxaparin (LOVENOX) injection 40 mg  40 mg Subcutaneous Q24H Enid Baas, MD   40 mg at 06/20/17 2223  . feeding supplement (ENSURE ENLIVE) (ENSURE ENLIVE) liquid 237 mL  237 mL Oral BID BM Pyreddy, Pavan, MD   237 mL at 06/20/17 1805  . guaiFENesin (MUCINEX) 12 hr tablet 600 mg  600 mg Oral BID Enid Baas, MD   600 mg at 06/21/17 1014  . ipratropium-albuterol (DUONEB) 0.5-2.5 (3) MG/3ML nebulizer solution  3 mL  3 mL Nebulization Q6H PRN Enid BaasKalisetti, Radhika, MD      . loratadine (CLARITIN) tablet 10 mg  10 mg Oral Daily Enid BaasKalisetti, Radhika, MD   10 mg at 06/21/17 1012  . LORazepam (ATIVAN) tablet 0.5 mg  0.5 mg Oral Q4H PRN Enid BaasKalisetti, Radhika, MD      . multivitamin with minerals tablet 1 tablet  1 tablet Oral Daily Pyreddy, Vivien RotaPavan, MD   1 tablet at 06/21/17 1012  . ondansetron (ZOFRAN) tablet 4 mg  4 mg Oral Q6H PRN Enid BaasKalisetti, Radhika, MD       Or  . ondansetron (ZOFRAN) injection 4 mg  4 mg Intravenous Q6H PRN Enid BaasKalisetti, Radhika, MD      . senna-docusate (Senokot-S) tablet 1 tablet  1 tablet Oral BID  Enid BaasKalisetti, Radhika, MD   1 tablet at 06/21/17 1012  . umeclidinium-vilanterol (ANORO ELLIPTA) 62.5-25 MCG/INH 1 puff  1 puff Inhalation Daily Enid BaasKalisetti, Radhika, MD   1 puff at 06/21/17 1012     Discharge Medications: Please see discharge summary for a list of discharge medications.  Relevant Imaging Results:  Relevant Lab Results:   Additional Information SS# [id number]  Judi CongKaren M Perfecto Purdy, LCSW

## 2017-06-21 NOTE — Clinical Social Work Note (Signed)
Clinical Social Work Assessment  Patient Details  Name: Leah [name] MRN: [id number] Date of Birth: 05-18-1932  Date of referral:  06/21/17               Reason for consult:  Facility Placement                Permission sought to share information with:  Oceanographeracility Contact Representative Permission granted to share information::  Yes, Verbal Permission Granted  Name::        Agency::  [facility]  Relationship::     Contact Information:     Housing/Transportation Living arrangements for the past 2 months:  Skilled Building surveyorursing Facility Source of Information:  Development worker, communityMedical Team, Facility Patient Interpreter Needed:  None Criminal Activity/Legal Involvement Pertinent to Current Situation/Hospitalization:  No - Comment as needed Significant Relationships:  Adult Children, Merchandiser, retailCommunity Support Lives with:  Facility Resident Do you feel safe going back to the place where you live?  Yes Need for family participation in patient care:  Yes (Comment)(Patient has significant dementia)  Care giving concerns:  Patient admitted from [facility]   Social Worker assessment / plan:  The CSW attempted to visit the patient and/or family at bedside. The patient was not alert enough to participate and no family was present. The CSW attempted to contact the patient's son, Leah [name], but was unable to reach him or leave a HIPPA compliant voicemail. The CSW contacted the patient's facility for information.  According to Schering-PloughCrystal at [facility], the patient is a LTC resident who was admitted from the [facility] having shown progress with her COPD. In the past 30 days, nodules have been found on the patient's lungs. The patient can return to the facility when stable, and the facility is willing to have the patient followed by palliative or hospice if needed. Crystal also advised that the patient's son works in the evening and may be difficult to reach.  The patient has a palliative  consult pending. The CSW will continue to follow for discharge facilitation if appropriate or for a hospice home referral if needed.  Employment status:  Retired Health and safety inspectornsurance information:  Armed forces operational officerMedicare, Medicaid [place] PT Recommendations:  Not assessed at this time Information / Referral to community resources:     Patient/Family's Response to care:  The patient was not alert enough to respond and family was not available.  Patient/Family's Understanding of and Emotional Response to Diagnosis, Current Treatment, and Prognosis:  The patient's treatment plan is pending palliative care consultation.  Emotional Assessment Appearance:  Appears stated age Attitude/Demeanor/Rapport:  Lethargic Affect (typically observed):  Stable Orientation:  Oriented to Self Alcohol / Substance use:  Never Used Psych involvement (Current and /or in the community):  No (Comment)  Discharge Needs  Concerns to be addressed:  Discharge Planning Concerns, Care Coordination, Other (Comment Required(Goals of Care) Readmission within the last 30 days:  No Current discharge risk:  Chronically ill Barriers to Discharge:  Continued Medical Work up   UAL CorporationKaren M Meya Clutter, LCSW 06/21/2017, 2:20 PM

## 2017-06-22 MED ORDER — GUAIFENESIN ER 600 MG PO TB12: 600.0000 mg | ORAL_TABLET | Freq: Two times a day (BID) | ORAL | 0 refills | Status: AC

## 2017-06-22 MED ORDER — ENSURE ENLIVE PO LIQD: 237.0000 mL | Freq: Two times a day (BID) | ORAL | 12 refills | Status: AC

## 2017-06-22 MED ORDER — LORAZEPAM 0.5 MG PO TABS: 0.5000 mg | ORAL_TABLET | ORAL | 0 refills | Status: AC | PRN

## 2017-06-22 MED ORDER — AMOXICILLIN-POT CLAVULANATE 875-125 MG PO TABS: 1.0000 | ORAL_TABLET | Freq: Two times a day (BID) | ORAL | 0 refills | Status: AC

## 2017-06-22 NOTE — Progress Notes (Signed)
IV was removed. Report called to Schering-PloughCrystal, Charity fundraiserN at [facility]. EMS called for transport.

## 2017-06-22 NOTE — Care Management Important Message (Signed)
Copy of signed IM left in patient's room.    

## 2017-06-22 NOTE — Clinical Social Work Note (Signed)
Patient discharging today to return to [facility]. CSW has informed Verlon AuLeslie at [facility] and sent the discharge information. CSW attempted to contact patient's son: Herbert SwazilandJordan: [phone] but there was no way to leave a message and he did not answer. York SpanielMonica Yasmina [place] MSW,LCSW [phone]

## 2017-06-22 NOTE — Progress Notes (Signed)
PT Cancellation Note  Patient Details Name: Mehlani I SwazilandJordan MRN: [id number] DOB: August 12, 1932   Cancelled Treatment:    Reason Eval/Treat Not Completed: Other (comment). PT consult received and chart reviewed. Due to dementia, pt is poor historian, unable to participate in therapy evaluation. Unable to follow commands for mobility attempts. Per chart, from [facility] with plans to return at baseline. Also, pt is WC bound, unsure if use of mechanical lift used. Will re-attempt next date if pt able to participate.   Brilee Port 06/22/2017, 9:51 AM Elizabeth PalauStephanie Marlissa Emerick, PT, DPT [phone]

## 2017-06-22 NOTE — Clinical Social Work Note (Signed)
Patient's son returned CSW phone call and is aware and in agreement for discharge and transport via EMS. York SpanielMonica Wlliam Grosso MSW,LCSW [phone]

## 2017-06-22 NOTE — Discharge Summary (Signed)
[facility] [place] [facility]   PATIENT NAME: Leah [name]    MR#:  [id number]  DATE OF BIRTH:  Mar 27, 1932  DATE OF ADMISSION:  06/18/2017   ADMITTING PHYSICIAN: Enid Baas, MD  DATE OF DISCHARGE:  06/22/17  PRIMARY CARE PHYSICIAN: [facility]   ADMISSION DIAGNOSIS:   Hypoxia [R09.02] Pneumonia of left lower lobe due to infectious organism (HCC) [J18.1]  DISCHARGE DIAGNOSIS:   Active Problems:   HCAP (healthcare-associated pneumonia)   SECONDARY DIAGNOSIS:   Past Medical History:  Diagnosis Date  . Anxiety   . COPD (chronic obstructive pulmonary disease) (HCC)   . Dementia   . Essential hypertension   . Fall   . Neurogenic bladder   . Pulmonary nodule     HOSPITAL COURSE:   AudreyJordanis a84 y.o.femalewith a known history of COPD on nocturnal home oxygen, severe Alzheimer's dementia, hypertension, neurogenic bladder was brought in from nursing home secondary to respiratory distress and cough and hypoxia.  1. Acute hypoxic respiratory failure-secondary to right middle lobe pneumonia, also has rhonchorous breath sounds on the left on exam -Blood cultures are negative, MRSA PCR is negative. -Discontinued vancomycin.  On cefepime - change to augmentin at discharge  -appreciate speech therapy consult.  Started on dysphagia diet with nectar thick liquids -Cough medications ordered. -Supplemental oxygen as needed-currently on 2 L - repeat CXR with New focal opacities within the LEFT upper lung, with questionable associated spiculation, concerning for neoplastic nodules, alternatively early developing pneumonia or focal atelectasis - follow up as outpatient - poor prognosis- palliative care follow up - apparently patient was discharged from hospice services recently  2. COPD-stable, no wheezing actively. -Nebs as needed and continue inhalers. No indication for systemic steroids at this time  3. Severe  dementia-seems to be at baseline. Minimal interaction, alert and disoriented. Usually wheelchair-bound at baseline   Social worker consult for discharge back to [facility] long term care. Will need palliative care at [facility] and may be hospice evaluation    DISCHARGE CONDITIONS:   Guarded  CONSULTS OBTAINED:   None  DRUG ALLERGIES:   No Known Allergies DISCHARGE MEDICATIONS:   Allergies as of 06/22/2017   No Known Allergies     Medication List    STOP taking these medications   enoxaparin 40 MG/0.4ML injection Commonly known as:  LOVENOX   midodrine 10 MG tablet Commonly known as:  PROAMATINE   morphine CONCENTRATE 10 MG/0.5ML Soln concentrated solution   prochlorperazine 25 MG suppository Commonly known as:  COMPAZINE     TAKE these medications   acetaminophen 325 MG tablet Commonly known as:  TYLENOL Take 2 tablets (650 mg total) by mouth every 4 (four) hours as needed for mild pain or fever.   amoxicillin-clavulanate 875-125 MG tablet Commonly known as:  AUGMENTIN Take 1 tablet by mouth every 12 (twelve) hours for 10 days.   ANORO ELLIPTA 62.5-25 MCG/INH Aepb Generic drug:  umeclidinium-vilanterol Inhale 1 puff into the lungs daily.   bisacodyl 10 MG suppository Commonly known as:  DULCOLAX Place 1 suppository (10 mg total) rectally daily as needed for moderate constipation.   feeding supplement (ENSURE ENLIVE) Liqd Take 237 mLs by mouth 2 (two) times daily between meals.   guaiFENesin 600 MG 12 hr tablet Commonly known as:  MUCINEX Take 1 tablet (600 mg total) by mouth 2 (two) times daily.   ipratropium-albuterol 0.5-2.5 (3) MG/3ML Soln Commonly known as:  DUONEB Take 3 mLs by nebulization every  2 (two) hours as needed. What changed:    how much to take  how to take this  when to take this  additional instructions   loratadine 10 MG tablet Commonly known as:  CLARITIN Take 10 mg by mouth daily.   LORazepam 0.5 MG  tablet Commonly known as:  ATIVAN Take 1 tablet (0.5 mg total) by mouth every 4 (four) hours as needed for anxiety.   senna-docusate 8.6-50 MG tablet Commonly known as:  Senokot-S Take 1 tablet by mouth 2 (two) times daily.   Vitamin D-3 5000 units Tabs Take 1 tablet by mouth daily.        DISCHARGE INSTRUCTIONS:   1. PCP f/u in  2-3 days 2. Palliative care follow up   DIET:   Dysphagia level 2 (MINCED foods) w/ NECTAR liquids; aspiration precautions; feeding support at all meals w/ reduced distractions during meals. Allow pt to help hold cup for drinking. Dietician f/u.   Medication Administration: Crushed with puree(as able) for safer swallowing  ACTIVITY:   Activity as tolerated  OXYGEN:   Home Oxygen: Yes.    Oxygen Delivery: 2-3 liters/min via Patient connected to nasal cannula oxygen  DISCHARGE LOCATION:   nursing home   If you experience worsening of your admission symptoms, develop shortness of breath, life threatening emergency, suicidal or homicidal thoughts you must seek medical attention immediately by calling 911 or calling your MD immediately  if symptoms less severe.  You Must read complete instructions/literature along with all the possible adverse reactions/side effects for all the Medicines you take and that have been prescribed to you. Take any new Medicines after you have completely understood and accpet all the possible adverse reactions/side effects.   Please note  You were cared for by a hospitalist during your hospital stay. If you have any questions about your discharge medications or the care you received while you were in the hospital after you are discharged, you can call the unit and asked to speak with the hospitalist on call if the hospitalist that took care of you is not available. Once you are discharged, your primary care physician will handle any further medical issues. Please note that NO REFILLS for any discharge medications will be  authorized once you are discharged, as it is imperative that you return to your primary care physician (or establish a relationship with a primary care physician if you do not have one) for your aftercare needs so that they can reassess your need for medications and monitor your lab values.    On the day of Discharge:  VITAL SIGNS:   Blood pressure (!) 166/71, pulse (!) 52, temperature 98.2 F (36.8 C), temperature source Oral, resp. rate 18, height 5\' 6"  (1.676 m), weight 73.3 kg (161 lb 9.6 oz), SpO2 98 %.  PHYSICAL EXAMINATION:    GENERAL:  84 y.o.-year-old elderly patient lying in the bed.  EYES: Pupils equal, round, reactive to light and accommodation. No scleral icterus. Extraocular muscles intact.  HEENT: Head atraumatic, normocephalic. Oropharynx and nasopharynx clear.  NECK:  Supple, no jugular venous distention. No thyroid enlargement, no tenderness.  LUNGS: Normal breath sounds bilaterally, decreased at the bases with coarse rhonchi. congested cough. No wheezing, rales or crepitation. No use of accessory muscles of respiration.  CARDIOVASCULAR: S1, S2 normal. No rubs, or gallops.  2/6 systolic murmur is present ABDOMEN: Soft, nontender, nondistended. Bowel sounds present. No organomegaly or mass.  EXTREMITIES: No pedal edema, cyanosis, or clubbing.  NEUROLOGIC: unable to follow  commands, moving all extremities in bed  PSYCHIATRIC: The patient is alert but disoriented SKIN: No obvious rash, lesion, or ulcer   DATA REVIEW:   CBC Recent Labs  Lab 06/21/17 0641  WBC 12.5*  HGB 12.7  HCT 38.9  PLT 379    Chemistries  Recent Labs  Lab 06/21/17 0641  NA 140  K 3.5  CL 105  CO2 30  GLUCOSE 91  BUN 17  CREATININE 0.80  CALCIUM 8.6*     Microbiology Results  Results for orders placed or performed during the hospital encounter of 06/18/17  Culture, blood (routine x 2)     Status: None (Preliminary result)   Collection Time: 06/18/17  5:58 PM  Result Value Ref  Range Status   Specimen Description BLOOD LT HAND  Final   Special Requests   Final    BOTTLES DRAWN AEROBIC AND ANAEROBIC Blood Culture adequate volume   Culture   Final    NO GROWTH 4 DAYS Performed at [facility], [address], [place], [place] [address]    Report Status PENDING  Incomplete  Culture, blood (routine x 2)     Status: None (Preliminary result)   Collection Time: 06/18/17  5:58 PM  Result Value Ref Range Status   Specimen Description BLOOD RT WRIST  Final   Special Requests   Final    BOTTLES DRAWN AEROBIC AND ANAEROBIC Blood Culture adequate volume   Culture   Final    NO GROWTH 4 DAYS Performed at [facility], [address] [place], [place] [address]    Report Status PENDING  Incomplete  MRSA PCR Screening     Status: None   Collection Time: 06/19/17  6:00 AM  Result Value Ref Range Status   MRSA by PCR NEGATIVE NEGATIVE Final    Comment:        The GeneXpert MRSA Assay (FDA approved for NASAL specimens only), is one component of a comprehensive MRSA colonization surveillance program. It is not intended to diagnose MRSA infection nor to guide or monitor treatment for MRSA infections. Performed at [facility], [address], [place], [place] [address]     RADIOLOGY:  No results found.   Management plans discussed with the patient, family and they are in agreement.  CODE STATUS:     Code Status Orders  (From admission, onward)        Start     Ordered   06/18/17 2023  Do not attempt resuscitation (DNR)  Continuous    Question Answer Comment  In the event of cardiac or respiratory ARREST Do not call a "code blue"   In the event of cardiac or respiratory ARREST Do not perform Intubation, CPR, defibrillation or ACLS   In the event of cardiac or respiratory ARREST Use medication by any route, position, wound care, and other measures to relive pain and suffering. May use oxygen, suction and manual treatment of  airway obstruction as needed for comfort.      06/18/17 2022    Code Status History    Date Active Date Inactive Code Status Order ID Comments User Context   02/11/2016 2316 02/16/2016 1412 DNR [id number]  Oralia Manis, MD Inpatient   02/11/2016 2206 02/11/2016 2207 Full Code [id number]  Juanell Fairly, MD ED   06/20/2015 1653 06/21/2015 2010 DNR [id number]  Suan Halter, MD Inpatient   06/19/2015 1729 06/20/2015 1653 DNR [id number]  Suan Halter, MD Inpatient   06/12/2015 1026 06/19/2015  1729 DNR [id number]  Erin Fulling, MD Inpatient   06/08/2015 0251 06/12/2015 1026 Full Code [id number]  Houston Siren, MD ED      TOTAL TIME TAKING CARE OF THIS PATIENT: .    Enid Baas M.D on 06/22/2017 at 9:55 AM  Between 7am to 6pm - Pager - [phone]  After 6pm go to [email or web address] - Social research officer, government  [facility]  Office  [phone]  CC: Primary care physician; [facility]   Note: This dictation was prepared with Dragon dictation along with smaller phrase technology. Any transcriptional errors that result from this process are unintentional.

## 2017-06-23 LAB — CULTURE, BLOOD (ROUTINE X 2)
CULTURE: NO GROWTH
Culture: NO GROWTH
SPECIAL REQUESTS: ADEQUATE
SPECIAL REQUESTS: ADEQUATE

## 2017-06-30 ENCOUNTER — Other Ambulatory Visit: Payer: Self-pay | Admitting: Nurse Practitioner

## 2017-06-30 DIAGNOSIS — I639 Cerebral infarction, unspecified: Secondary | ICD-10-CM

## 2017-07-01 ENCOUNTER — Ambulatory Visit
Admission: RE | Admit: 2017-07-01 | Discharge: 2017-07-01 | Disposition: A | Discharge status: Home / Self Care | Payer: Medicare Other | Source: Ambulatory Visit | Attending: Nurse Practitioner | Admitting: Nurse Practitioner

## 2017-07-01 DIAGNOSIS — I6522 Occlusion and stenosis of left carotid artery: Secondary | ICD-10-CM | POA: Diagnosis not present

## 2017-07-01 DIAGNOSIS — J338 Other polyp of sinus: Secondary | ICD-10-CM | POA: Insufficient documentation

## 2017-07-01 DIAGNOSIS — J322 Chronic ethmoidal sinusitis: Secondary | ICD-10-CM | POA: Insufficient documentation

## 2017-07-01 DIAGNOSIS — J323 Chronic sphenoidal sinusitis: Secondary | ICD-10-CM | POA: Insufficient documentation

## 2017-07-01 DIAGNOSIS — R531 Weakness: Secondary | ICD-10-CM | POA: Diagnosis not present

## 2017-07-01 DIAGNOSIS — I639 Cerebral infarction, unspecified: Secondary | ICD-10-CM | POA: Insufficient documentation

## 2017-07-01 DIAGNOSIS — I6782 Cerebral ischemia: Secondary | ICD-10-CM | POA: Diagnosis not present

## 2017-07-01 MED ORDER — IOPAMIDOL (ISOVUE-370) INJECTION 76%
75.0000 mL | Freq: Once | INTRAVENOUS | Status: AC | PRN
  Administered 2017-07-01: 75 mL via INTRAVENOUS

## 2017-08-11 ENCOUNTER — Other Ambulatory Visit (INDEPENDENT_AMBULATORY_CARE_PROVIDER_SITE_OTHER): Payer: Self-pay

## 2017-08-11 DIAGNOSIS — I63233 Cerebral infarction due to unspecified occlusion or stenosis of bilateral carotid arteries: Secondary | ICD-10-CM

## 2017-08-13 ENCOUNTER — Ambulatory Visit
Admission: RE | Admit: 2017-08-13 | Discharge: 2017-08-13 | Disposition: A | Discharge status: Home / Self Care | Payer: Medicare Other | Source: Ambulatory Visit | Attending: Vascular Surgery | Admitting: Vascular Surgery

## 2017-08-13 ENCOUNTER — Encounter (INDEPENDENT_AMBULATORY_CARE_PROVIDER_SITE_OTHER): Payer: Medicare Other

## 2017-08-13 ENCOUNTER — Encounter (INDEPENDENT_AMBULATORY_CARE_PROVIDER_SITE_OTHER): Payer: Medicare Other | Admitting: Vascular Surgery

## 2017-08-13 ENCOUNTER — Encounter

## 2017-08-13 DIAGNOSIS — I6523 Occlusion and stenosis of bilateral carotid arteries: Secondary | ICD-10-CM | POA: Diagnosis not present

## 2017-08-13 DIAGNOSIS — I63233 Cerebral infarction due to unspecified occlusion or stenosis of bilateral carotid arteries: Secondary | ICD-10-CM | POA: Diagnosis not present

## 2017-08-17 ENCOUNTER — Encounter

## 2017-08-17 ENCOUNTER — Encounter (INDEPENDENT_AMBULATORY_CARE_PROVIDER_SITE_OTHER): Payer: Self-pay | Admitting: Vascular Surgery

## 2017-08-17 ENCOUNTER — Ambulatory Visit (INDEPENDENT_AMBULATORY_CARE_PROVIDER_SITE_OTHER): Payer: Medicare Other | Admitting: Vascular Surgery

## 2017-08-17 VITALS — Resp 15 | Ht 63.0 in | Wt 148.0 lb

## 2017-08-17 DIAGNOSIS — I6523 Occlusion and stenosis of bilateral carotid arteries: Secondary | ICD-10-CM | POA: Diagnosis not present

## 2017-08-17 DIAGNOSIS — E118 Type 2 diabetes mellitus with unspecified complications: Secondary | ICD-10-CM

## 2017-08-17 DIAGNOSIS — E119 Type 2 diabetes mellitus without complications: Secondary | ICD-10-CM | POA: Insufficient documentation

## 2017-08-17 NOTE — Progress Notes (Signed)
Subjective:    Patient ID: Leah [name], female    DOB: 1932/12/28, 84 y.o.   MRN: [id number] Chief Complaint  Patient presents with  . New Patient (Initial Visit)    Carotid artery stenosis   Presents as a new patient seen in the hospital for carotid stenosis.  Patient presents today to review vascular studies.  The patient underwent a bilateral carotid ultrasound at [facility] yesterday.  Patient was found to have "50-69% stenosis in the right internal carotid artery, Left internal carotid artery occlusion, bilateral antegrade vertebral arteries."  The patient has advanced dementia and is unable to provide a reliable history.  As per the patient's aide, the patient does not seem to be experiencing any new/worsening neurological symptoms.  Patient continues to take aspirin on a daily basis.  Review of Systems  Constitutional: Negative.   HENT: Negative.   Eyes: Negative.   Respiratory: Negative.   Cardiovascular:       Carotid Stenosis  Gastrointestinal: Negative.   Endocrine: Negative.   Genitourinary: Negative.   Musculoskeletal: Negative.   Skin: Negative.   Allergic/Immunologic: Negative.   Neurological: Negative.   Hematological: Negative.   Psychiatric/Behavioral: Negative.       Objective:   Physical Exam  Constitutional: She appears well-developed and well-nourished. No distress.  HENT:  Head: Normocephalic and atraumatic.  Right Ear: External ear normal.  Left Ear: External ear normal.  Eyes: Pupils are equal, round, and reactive to light. Conjunctivae and EOM are normal.  Neck: Normal range of motion.  Cardiovascular: Normal rate, regular rhythm, normal heart sounds and intact distal pulses.  Pulses:      Radial pulses are 2+ on the right side, and 2+ on the left side.  Right carotid bruit noted on exam  Pulmonary/Chest: Effort normal and breath sounds normal.  Musculoskeletal: Normal range of motion.  Neurological: She is alert.    Skin: Skin is warm and dry. She is not diaphoretic.  Psychiatric: She has a normal mood and affect. Her behavior is normal. Judgment and thought content normal.  Vitals reviewed.  Resp 15   Ht 5\' 3"  (1.6 m)   Wt 148 lb (67.1 kg)   BMI 26.22 kg/m   Past Medical History:  Diagnosis Date  . Anxiety   . COPD (chronic obstructive pulmonary disease) (HCC)   . Dementia   . Essential hypertension   . Fall   . Neurogenic bladder   . Pulmonary nodule    Social History   Socioeconomic History  . Marital status: Widowed    Spouse name: Not on file  . Number of children: Not on file  . Years of education: Not on file  . Highest education level: Not on file  Occupational History  . Not on file  Social Needs  . Financial resource strain: Not on file  . Food insecurity:    Worry: Not on file    Inability: Not on file  . Transportation needs:    Medical: Not on file    Non-medical: Not on file  Tobacco Use  . Smoking status: Current Every Day Smoker    Packs/day: 1.00    Years: 60.00    Pack years: 60.00    Types: Cigarettes  . Smokeless tobacco: Never Used  . Tobacco comment: quit a year ago  Substance and Sexual Activity  . Alcohol use: No    Alcohol/week: 0.0 oz  . Drug use: No  . Sexual activity: Not on file  Lifestyle  . Physical activity:    Days per week: Not on file    Minutes per session: Not on file  . Stress: Not on file  Relationships  . Social connections:    Talks on phone: Not on file    Gets together: Not on file    Attends religious service: Not on file    Active member of club or organization: Not on file    Attends meetings of clubs or organizations: Not on file    Relationship status: Not on file  . Intimate partner violence:    Fear of current or ex partner: Not on file    Emotionally abused: Not on file    Physically abused: Not on file    Forced sexual activity: Not on file  Other Topics Concern  . Not on file  Social History Narrative    From [facility] long term care   Wheelchair bound at baseline   Past Surgical History:  Procedure Laterality Date  . HIP ARTHROPLASTY Left 02/12/2016   Procedure: ARTHROPLASTY BIPOLAR HIP (HEMIARTHROPLASTY);  Surgeon: Juanell Fairly, MD;  Location: [facility] ORS;  Service: Orthopedics;  Laterality: Left;   Family History  Problem Relation Age of Onset  . Dementia Mother    No Known Allergies     Assessment & Plan:  Presents as a new patient seen in the hospital for carotid stenosis.  Patient presents today to review vascular studies.  The patient underwent a bilateral carotid ultrasound at [facility] yesterday.  Patient was found to have "50-69% stenosis in the right internal carotid artery, Left internal carotid artery occlusion, bilateral antegrade vertebral arteries."  The patient has advanced dementia and is unable to provide a reliable history.  As per the patient's aide, the patient does not seem to be experiencing any new/worsening neurological symptoms.  Patient continues to take aspirin on a daily basis.  1. Bilateral carotid artery stenosis - New Studies reviewed with patient. Patient asymptomatic with stable duplex.  No intervention at this time.  Patient to return in six months for surveillance carotid duplex. Patient to continue medical optimization with ASA  Recommend the addition of a statin if the patient's primary care physician agrees Patient to remain abstinent of tobacco use. I have discussed with the patient at length the risk factors for and pathogenesis of atherosclerotic disease and encouraged a healthy diet, regular exercise regimen and blood pressure / glucose control.  Patient was instructed to contact our office in the interim with problems such as arm / leg weakness or numbness, speech / swallowing difficulty or temporary monocular blindness. The patient expresses their understanding.   - VAS US CAROTID; Future  2. Type 2 diabetes  mellitus with complication, unspecified whether long term insulin use (HCC) - Stable Encouraged good control as its slows the progression of atherosclerotic disease  Current Outpatient Medications on File Prior to Visit  Medication Sig Dispense Refill  . acetaminophen (TYLENOL) 325 MG tablet Take 2 tablets (650 mg total) by mouth every 4 (four) hours as needed for mild pain or fever.    Marland Kitchen aspirin EC 81 MG tablet Take 81 mg by mouth daily.    . Cholecalciferol (VITAMIN D-3) 5000 units TABS Take 1 tablet by mouth daily.    . feeding supplement, ENSURE ENLIVE, (ENSURE ENLIVE) LIQD Take 237 mLs by mouth 2 (two) times daily between meals. 237 mL 12  . guaiFENesin (MUCINEX) 600 MG 12 hr tablet Take 1 tablet (600 mg  total) by mouth 2 (two) times daily. 20 tablet 0  . ipratropium-albuterol (DUONEB) 0.5-2.5 (3) MG/3ML SOLN Take 3 mLs by nebulization every 2 (two) hours as needed. (Patient taking differently: Inhale 3ml via nebulizer twice daily as scheduled and every 2 hours as needed for shortness of breath and COPD symptoms) 30 mL   . loratadine (CLARITIN) 10 MG tablet Take 10 mg by mouth daily.    Marland Kitchen. LORazepam (ATIVAN) 0.5 MG tablet Take 1 tablet (0.5 mg total) by mouth every 4 (four) hours as needed for anxiety. 15 tablet 0  . metFORMIN (GLUCOPHAGE) 500 MG tablet Take 500 mg by mouth 2 (two) times daily with a meal.    . senna-docusate (SENOKOT-S) 8.6-50 MG tablet Take 1 tablet by mouth 2 (two) times daily.    Marland Kitchen. umeclidinium-vilanterol (ANORO ELLIPTA) 62.5-25 MCG/INH AEPB Inhale 1 puff into the lungs daily.    . bisacodyl (DULCOLAX) 10 MG suppository Place 1 suppository (10 mg total) rectally daily as needed for moderate constipation. (Patient not taking: Reported on 08/17/2017) 6 suppository 0   No current facility-administered medications on file prior to visit.    There are no Patient Instructions on file for this visit. No follow-ups on file.  Viggo Perko A Binta Statzer, PA-C

## 2018-02-22 ENCOUNTER — Ambulatory Visit (INDEPENDENT_AMBULATORY_CARE_PROVIDER_SITE_OTHER): Payer: Medicaid Other | Admitting: Vascular Surgery

## 2018-02-22 ENCOUNTER — Encounter (INDEPENDENT_AMBULATORY_CARE_PROVIDER_SITE_OTHER): Payer: Medicaid Other

## 2018-02-22 NOTE — Progress Notes (Deleted)
MRN : [id number]  Leah [name] is a 84 y.o. (November 29, 1932) female who presents with chief complaint of No chief complaint on file. Marland Kitchen.  History of Present Illness:   The patient is seen for follow up evaluation of carotid stenosis. The carotid stenosis followed by ultrasound.  Patient was found to have "50-69% stenosis in the right internal carotid artery, Left internal carotid artery occlusion, bilateral antegrade vertebral arteries."  The patient has advanced dementia and is unable to provide a reliable history.  As per the patient's aide, the patient does not seem to be experiencing any new/worsening neurological symptoms  The patient's aid denies the patient has had any episodes of amaurosis fugax. There is no recent history of TIA symptoms or focal motor deficits.   The patient is taking enteric-coated aspirin 81 mg daily.  There is no history of migraine headaches. There is no history of seizures.  The patient has a history of coronary artery disease, no recent episodes of angina or shortness of breath. The patient denies PAD or claudication symptoms. There is a history of hyperlipidemia which is being treated with a statin.    Carotid Duplex done today shows ***.  No change compared to last study in ***    No outpatient medications have been marked as taking for the 02/22/18 encounter (Appointment) with Gilda CreaseSchnier, Latina CraverGregory G, MD.    Past Medical History:  Diagnosis Date  . Anxiety   . COPD (chronic obstructive pulmonary disease) (HCC)   . Dementia   . Essential hypertension   . Fall   . Neurogenic bladder   . Pulmonary nodule     Past Surgical History:  Procedure Laterality Date  . HIP ARTHROPLASTY Left 02/12/2016   Procedure: ARTHROPLASTY BIPOLAR HIP (HEMIARTHROPLASTY);  Surgeon: Juanell FairlyKevin Krasinski, MD;  Location: [facility] ORS;  Service: Orthopedics;  Laterality: Left;    Social History Social History   Tobacco Use  . Smoking status: Current Every Day Smoker   Packs/day: 1.00    Years: 60.00    Pack years: 60.00    Types: Cigarettes  . Smokeless tobacco: Never Used  . Tobacco comment: quit a year ago  Substance Use Topics  . Alcohol use: No    Alcohol/week: 0.0 standard drinks  . Drug use: No    Family History Family History  Problem Relation Age of Onset  . Dementia Mother     No Known Allergies   REVIEW OF SYSTEMS (Negative unless checked)  Constitutional: [] Weight loss  [] Fever  [] Chills Cardiac: [] Chest pain   [] Chest pressure   [] Palpitations   [] Shortness of breath when laying flat   [] Shortness of breath with exertion. Vascular:  [] Pain in legs with walking   [] Pain in legs at rest  [] History of DVT   [] Phlebitis   [] Swelling in legs   [] Varicose veins   [] Non-healing ulcers Pulmonary:   [] Uses home oxygen   [] Productive cough   [] Hemoptysis   [] Wheeze  [] COPD   [] Asthma Neurologic:  [] Dizziness   [] Seizures   [] History of stroke   [] History of TIA  [] Aphasia   [] Vissual changes   [] Weakness or numbness in arm   [] Weakness or numbness in leg Musculoskeletal:   [] Joint swelling   [] Joint pain   [] Low back pain Hematologic:  [] Easy bruising  [] Easy bleeding   [] Hypercoagulable state   [] Anemic Gastrointestinal:  [] Diarrhea   [] Vomiting  [] Gastroesophageal reflux/heartburn   [] Difficulty swallowing. Genitourinary:  [] Chronic kidney disease   [] Difficult urination  [] Frequent  urination   [] Blood in urine Skin:  [] Rashes   [] Ulcers  Psychological:  [] History of anxiety   []  History of major depression.  Physical Examination  There were no vitals filed for this visit. There is no height or weight on file to calculate BMI. Gen: WD/WN, NAD Head: [place]/AT, No temporalis wasting.  Ear/Nose/Throat: Hearing grossly intact, nares w/o erythema or drainage Eyes: PER, EOMI, sclera nonicteric.  Neck: Supple, no large masses.   Pulmonary:  Good air movement, no audible wheezing bilaterally, no use of accessory muscles.  Cardiac: RRR, no  JVD Vascular: *** Vessel Right Left  Radial Palpable Palpable  Ulnar Palpable Palpable  Brachial Palpable Palpable  Carotid Palpable Palpable  Femoral Palpable Palpable  Popliteal Palpable Palpable  PT Palpable Palpable  DP Palpable Palpable  Gastrointestinal: Non-distended. No guarding/no peritoneal signs.  Musculoskeletal: M/S 5/5 throughout.  No deformity or atrophy.  Neurologic: CN 2-12 intact. Symmetrical.  Speech is fluent. Motor exam as listed above. Psychiatric: Judgment intact, Mood & affect appropriate for pt's clinical situation. Dermatologic: No rashes or ulcers noted.  No changes consistent with cellulitis. Lymph : No lichenification or skin changes of chronic lymphedema.  CBC Lab Results  Component Value Date   WBC 12.5 (H) 06/21/2017   HGB 12.7 06/21/2017   HCT 38.9 06/21/2017   MCV 82.7 06/21/2017   PLT 379 06/21/2017    BMET    Component Value Date/Time   NA 140 06/21/2017 0641   K 3.5 06/21/2017 0641   CL 105 06/21/2017 0641   CO2 30 06/21/2017 0641   GLUCOSE 91 06/21/2017 0641   BUN 17 06/21/2017 0641   CREATININE 0.80 06/21/2017 0641   CALCIUM 8.6 (L) 06/21/2017 0641   GFRNONAA >60 06/21/2017 0641   GFRAA >60 06/21/2017 0641   CrCl cannot be calculated (Patient's most recent lab result is older than the maximum 21 days allowed.).  COAG Lab Results  Component Value Date   INR 1.08 02/12/2016   INR 1.07 02/11/2016    Radiology No results found.   Assessment/Plan 1. Bilateral carotid artery stenosis ***  2. Essential hypertension ***  3. Chronic obstructive pulmonary disease, unspecified COPD type (HCC) ***  4. Type 2 diabetes mellitus with other circulatory complication, unspecified whether long term insulin use (HCC) ***    Levora Dredge, MD  02/22/2018 8:18 AM

## 2018-03-10 DEATH — deceased

## 2018-11-17 IMAGING — CT CT ANGIO HEAD
3 of 11 series · 15 of 47 positions shown · IV contrast (iopamidol)
Comparison: 06/07/2015 head CT

CLINICAL DATA: Weakness on the right side for several months.  CVA.

History of severe dementia.
EXAM:
CT ANGIOGRAPHY HEAD
TECHNIQUE: Multidetector CT imaging of the head was performed using the
standard protocol during bolus administration of intravenous
contrast. Multiplanar CT image reconstructions and MIPs were
obtained to evaluate the vascular anatomy.
CONTRAST:  75mL PEMD4A-MK3 IOPAMIDOL (PEMD4A-MK3) INJECTION 76%

[Series 8: ax thin · axial · 0.34mm/px · z∈[-86,+45]mm · 10 of 155 slices shown]
[im 12/155  brain]
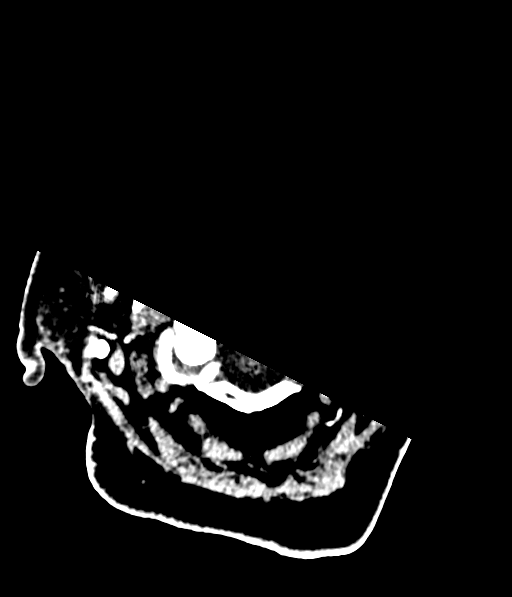
[im 23/155  bone]
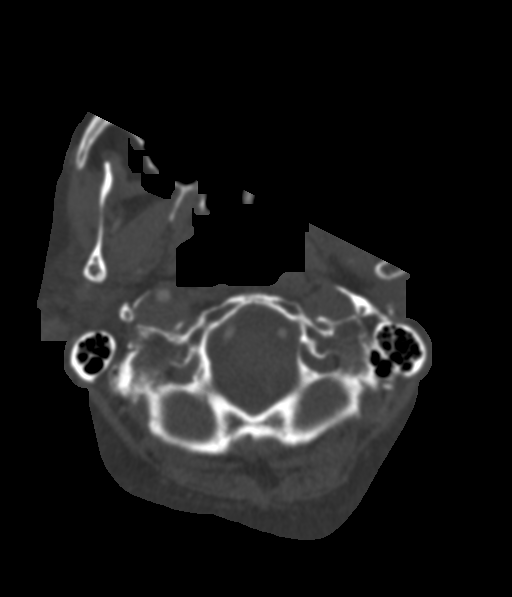
[im 45/155  brain]
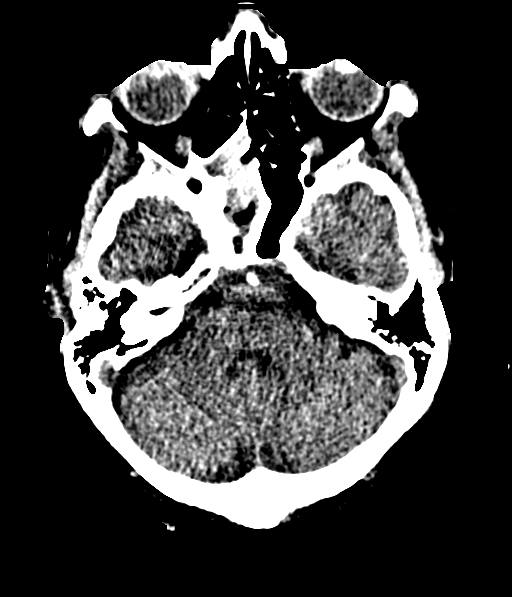
[im 56/155  bone]
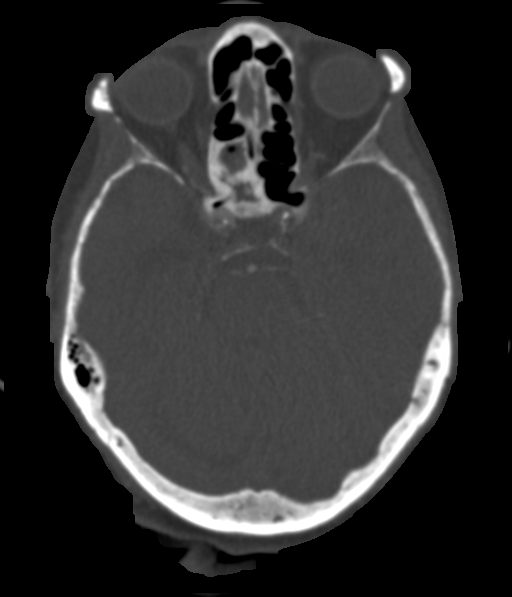
[im 67/155  brain]
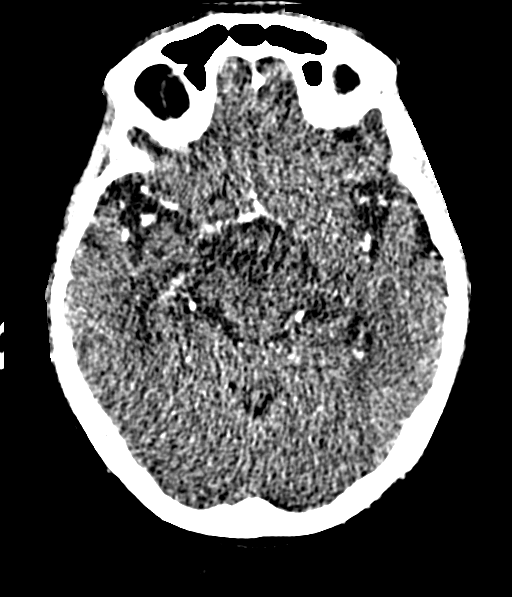
[im 89/155  bone]
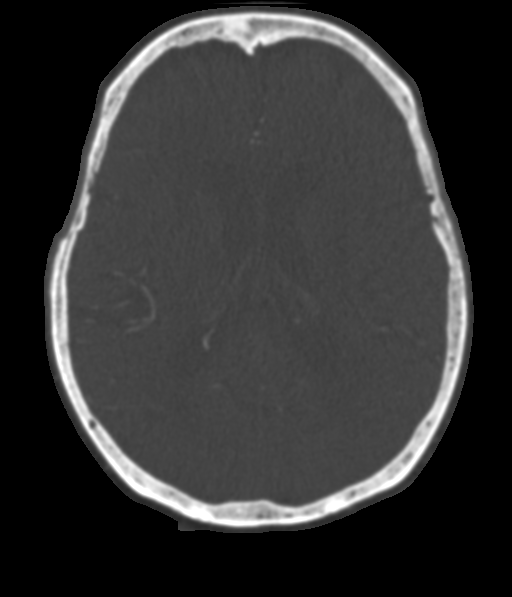
[im 100/155  brain]
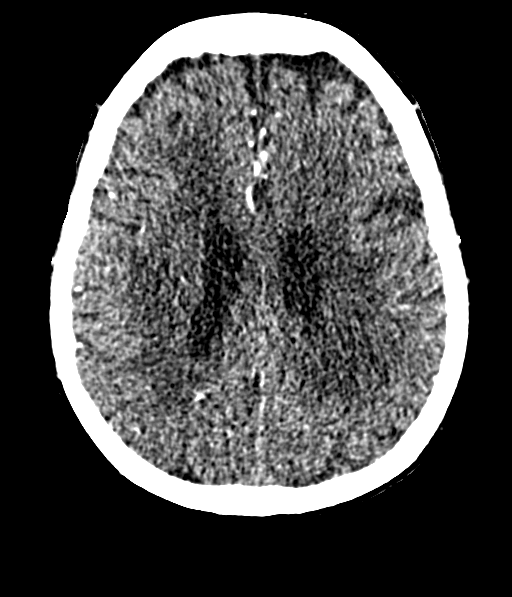
[im 111/155  bone]
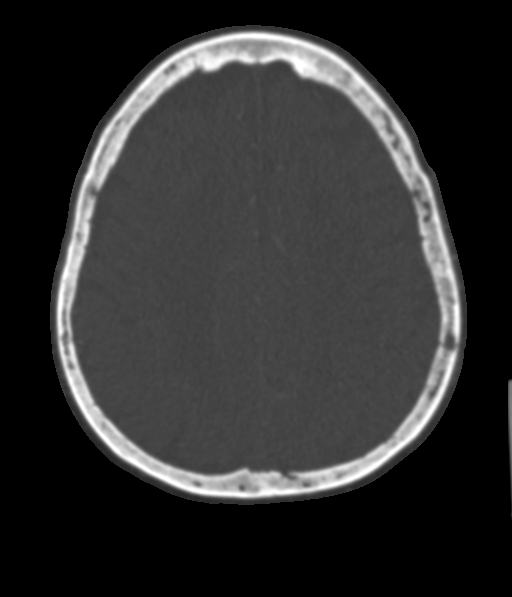
[im 133/155  brain]
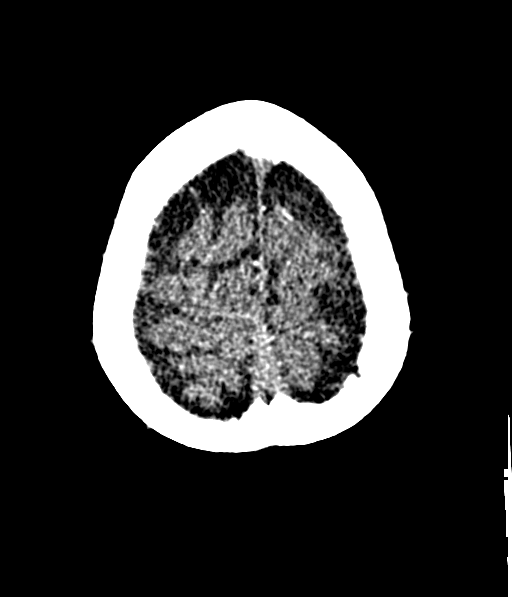
[im 144/155  bone]
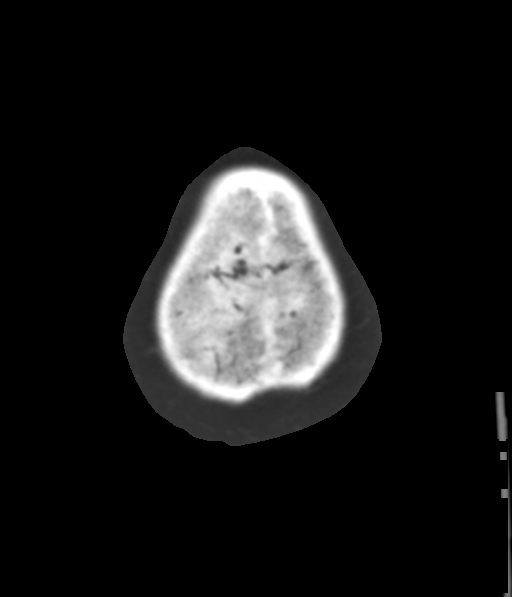

[Series 10: cor thin · coronal · 0.30mm/px · 3 of 203 slices shown]
[im 68/203  brain]
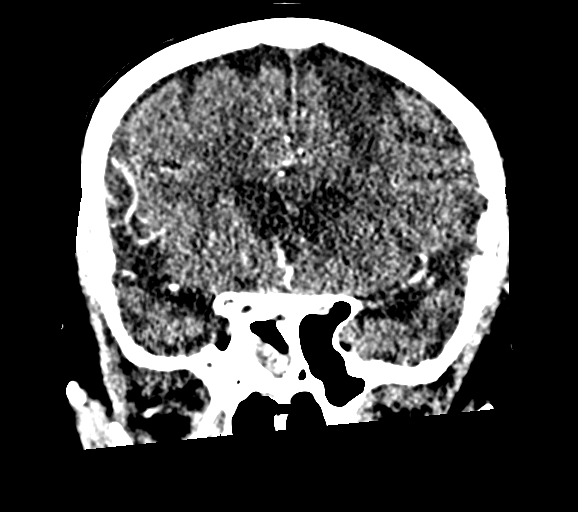
[im 102/203  brain]
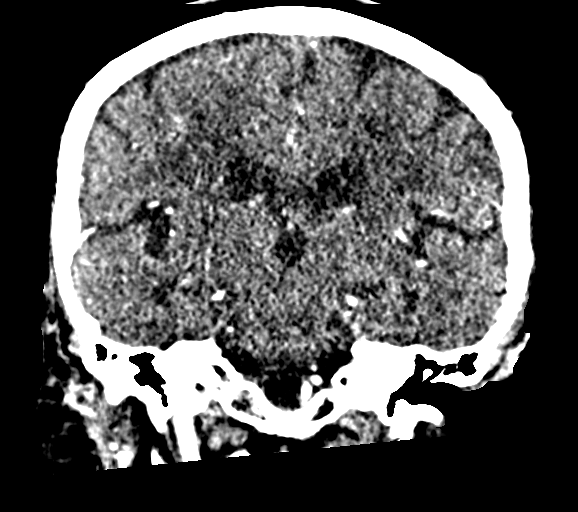
[im 135/203  brain]
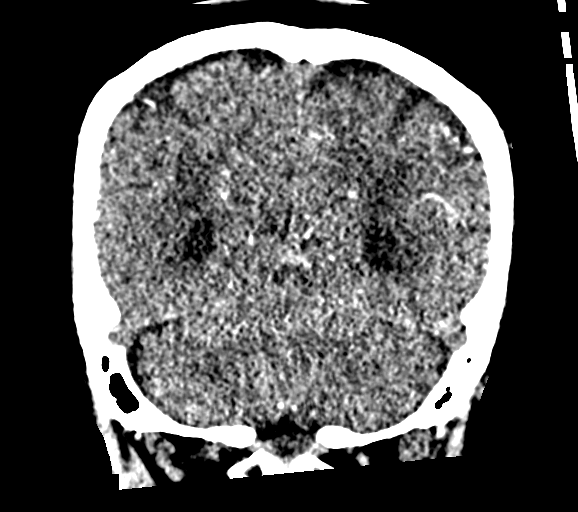

[Series 12: sag thin · sagittal · 0.29mm/px · 2 of 163 slices shown]
[im 55/163  brain]
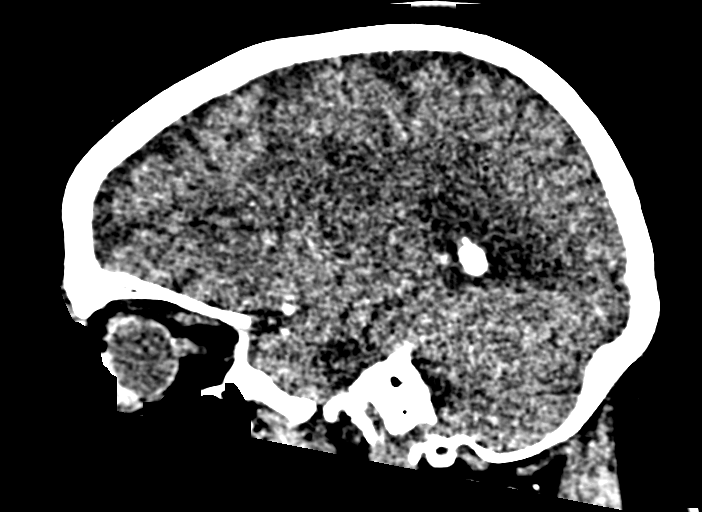
[im 109/163  brain]
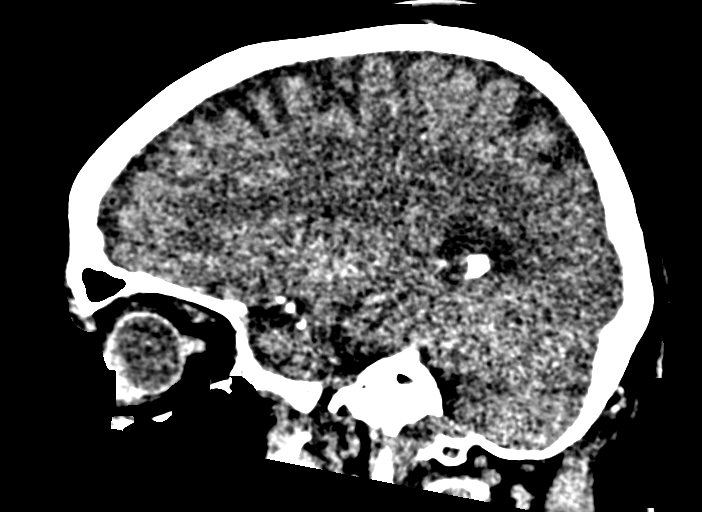

[15 of 47 positions shown; findings below may reference images not displayed]

FINDINGS: CT HEAD

Brain: Small high left frontal cortex infarct with neutral volume.
There is interval left basal ganglia infarcts with fairly neutral
volume. These do not enhance on postcontrast imaging. Remote
perforator infarct in the lateral left lenticulostriate
distribution. There is chronic extensive low-density in the cerebral
white matter attributed to chronic small vessel ischemia. History of
Alzheimer's dementia. Brain atrophy is mild for age and this
history. No hydrocephalus, collection, or masslike finding.

Vascular: See below

Skull: No acute finding

Sinuses: Chronic right posterior ethmoid and sphenoid sinusitis with
sclerotic wall thickening and opacification. There is a polypoid
density in the posterior right nasal cavity at the sphenoid
ethmoidal recess. These findings are chronic.

Orbits: Negative

CTA HEAD

Anterior circulation: No flow seen within the left carotid in the
neck with faint reconstitution at the level of the supraclinoid ICA.
The left A1 segment is small, hypoplasia versus underfilling. No
visible anterior communicating artery favors underfilling. There is
a small left posterior communicating artery. Left MCA branches are
comparatively under filled and faint. High-grade narrowing at the
proximal left A2 segment. Moderate narrowing at the right proximal
A2 segment. Atherosclerotic plaque on the right carotid siphon with
moderate paraclinoid segment narrowing.

Posterior circulation: Codominant vertebral arteries. The vertebral
and basilar arteries are smooth and diffusely patent. Symmetric
robust flow in the bilateral proximal posterior cerebral arteries.
The more superior first order branch of the right PCA shows
high-grade proximal narrowing.

Venous sinuses: Patent

Anatomic variants: None significant

Delayed phase: No abnormal intracranial enhancement, including at
the infarcts.

These results will be called to the ordering clinician or
representative by the Radiologist Assistant, and communication
documented in the PACS or zVision Dashboard.
IMPRESSION: 1. Occluded left ICA in the neck. There is intracranial
reconstitution but communicating arteries are small and the left MCA
branches and left A1 segment appear under filled.
2. Small infarcts in the left basal ganglia and high left frontal
cortex are age indeterminate by imaging. History of right-sided
weakness for months favors chronicity.
3. Advanced chronic small vessel ischemia.
4. Chronic right posterior ethmoid and sphenoid sinusitis with polyp
at the sphenoid ethmoidal recess.

## 2018-11-28 IMAGING — US US CAROTID DUPLEX BILAT
1 series · 13 of 24 positions shown · non-contrast
Comparison: 02/17/2008

CLINICAL DATA: CVA.  History of carotid artery occlusion.

EXAM:
BILATERAL CAROTID DUPLEX ULTRASOUND
TECHNIQUE: Gray scale imaging, color Doppler and duplex ultrasound were
performed of bilateral carotid and vertebral arteries in the neck.

[Series 1: us carotid duplex bilat · 0.06mm/px · 13 of 72 slices shown]
[im 1/72]
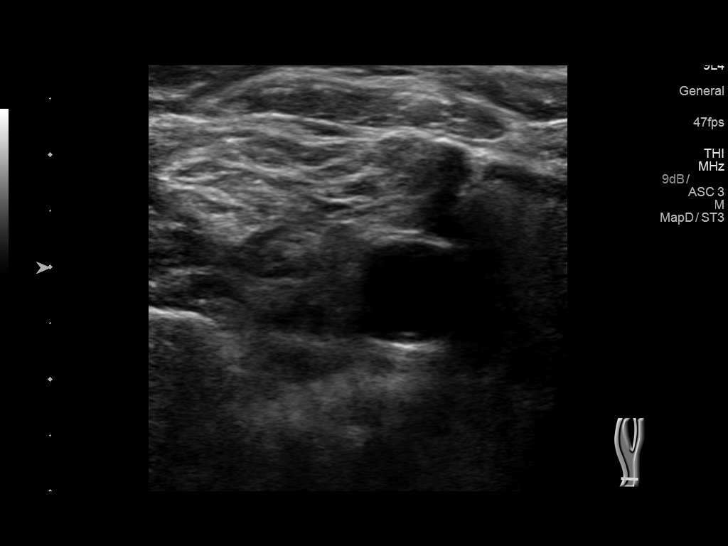
[im 7/72]
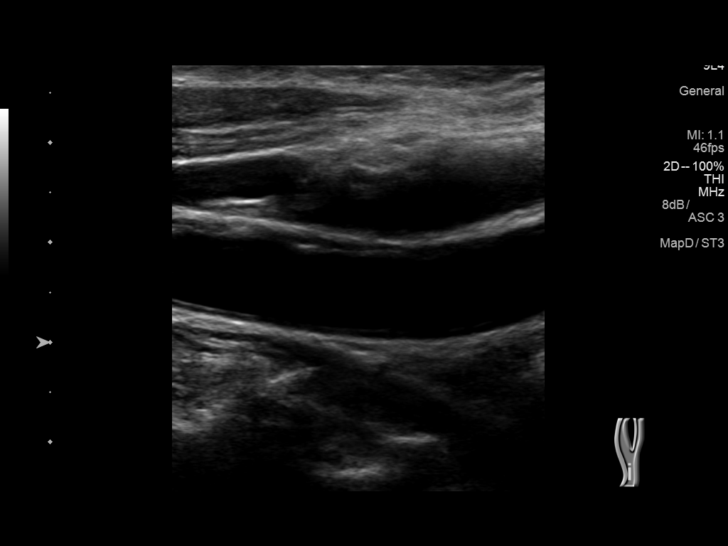
[im 13/72]
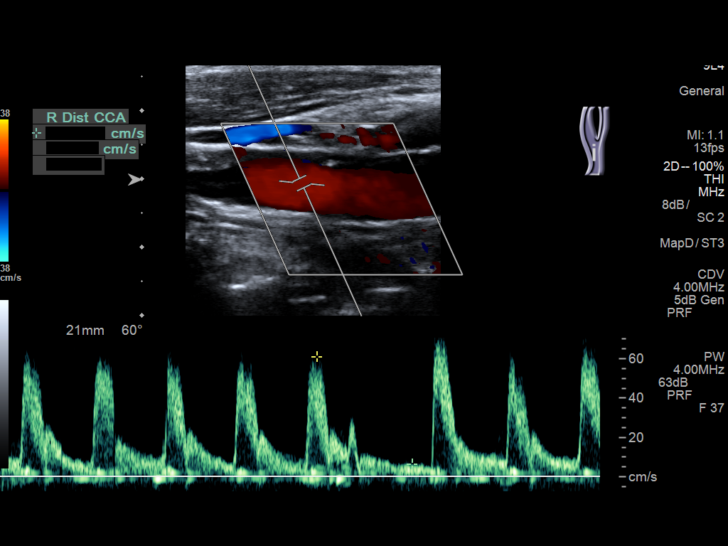
[im 19/72]
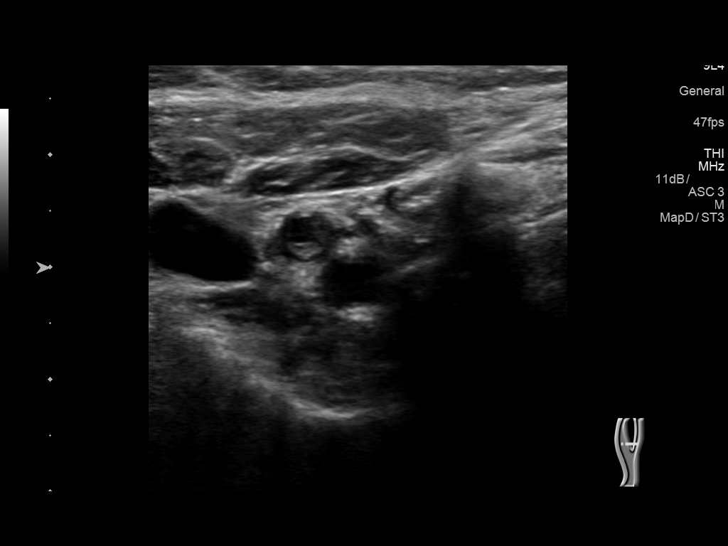
[im 25/72]
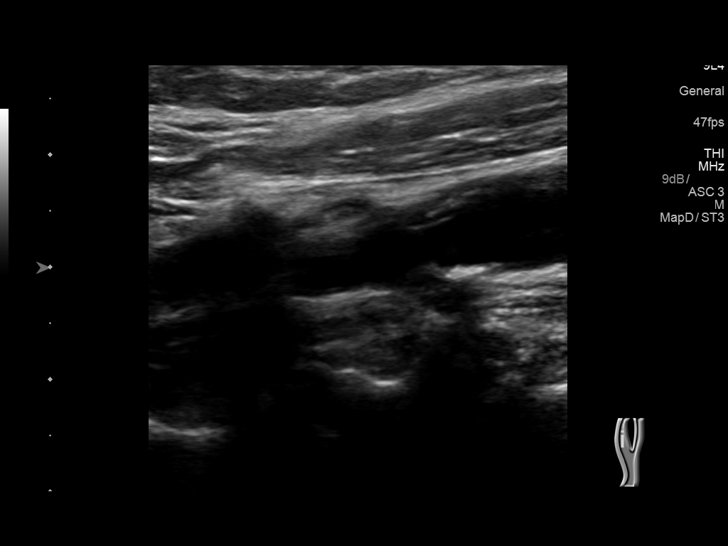
[im 31/72]
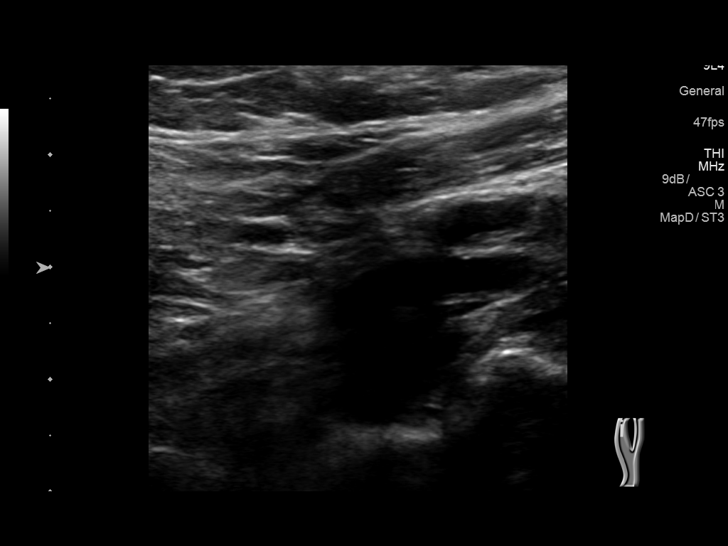
[im 38/72]
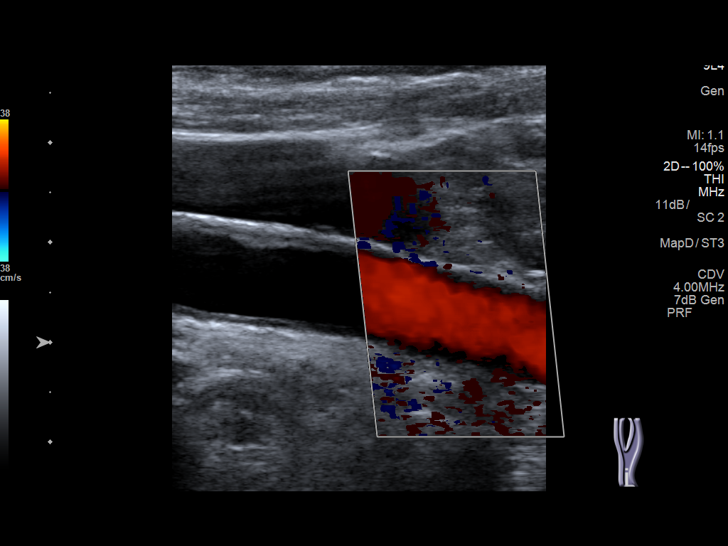
[im 41/72]
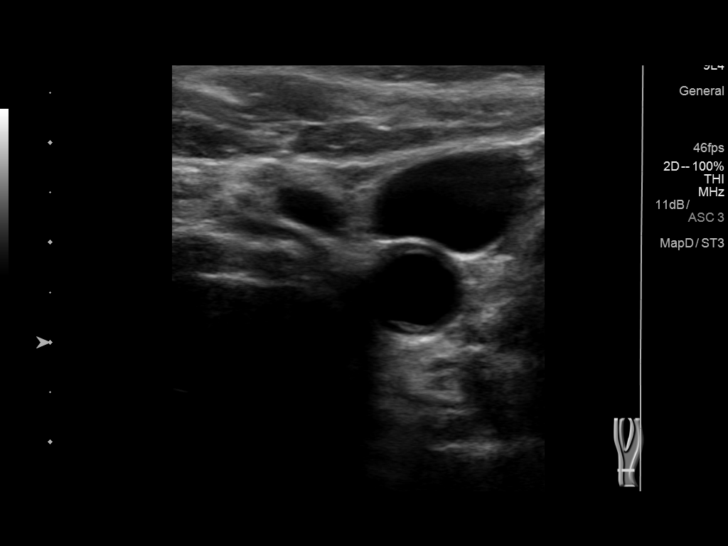
[im 47/72]
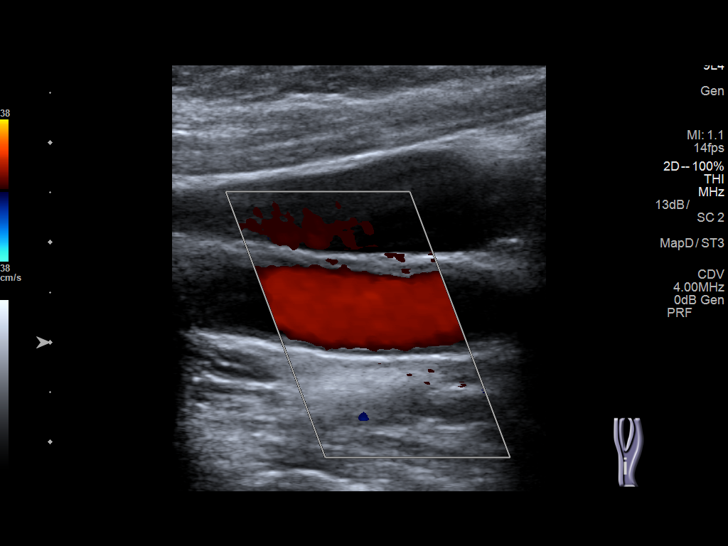
[im 53/72]
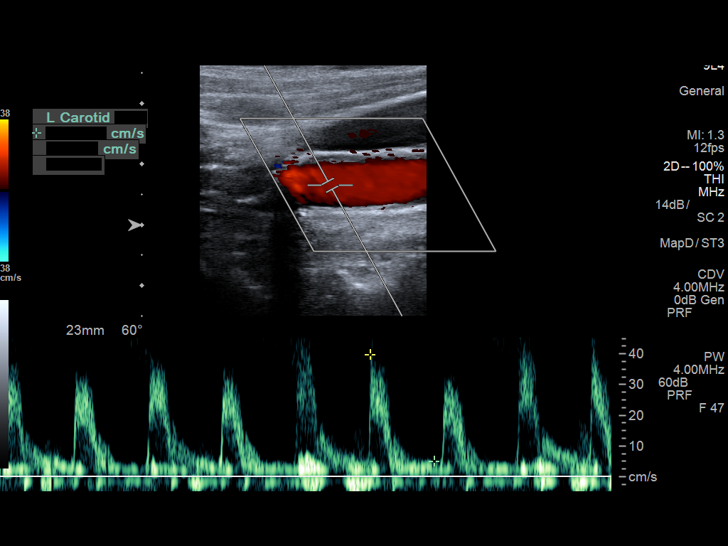
[im 59/72]
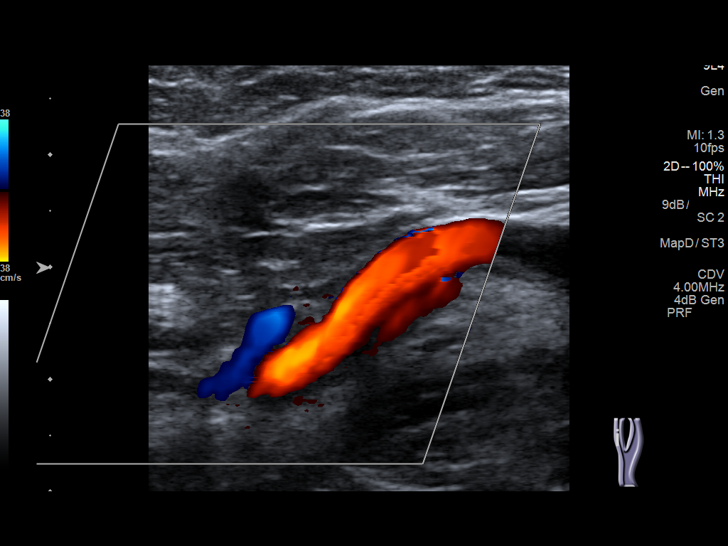
[im 65/72]
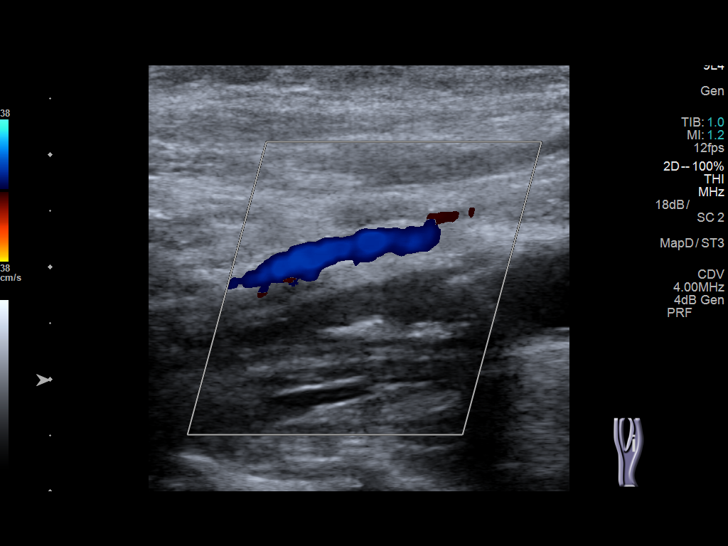
[im 72/72]
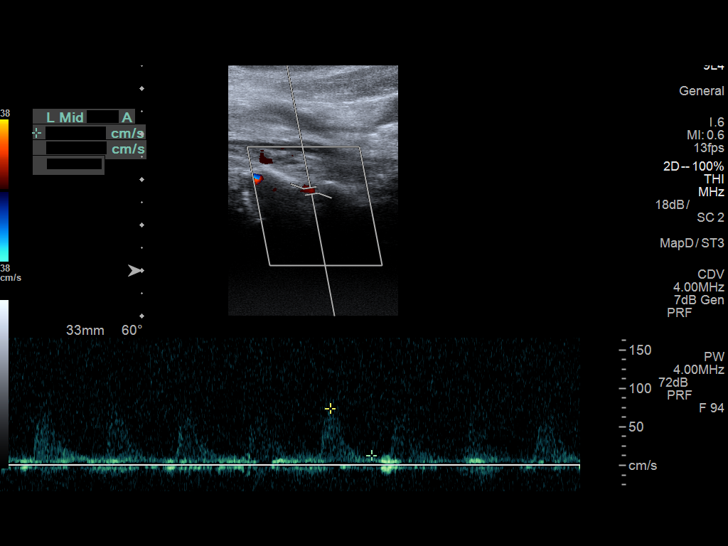

[13 of 24 positions shown; findings below may reference images not displayed]

FINDINGS: Criteria: Quantification of carotid stenosis is based on velocity
parameters that correlate the residual internal carotid diameter
with NASCET-based stenosis levels, using the diameter of the distal
internal carotid lumen as the denominator for stenosis measurement.

The following velocity measurements were obtained:

RIGHT

ICA:  132 cm/sec

CCA:  61 cm/sec

SYSTOLIC ICA/CCA RATIO:

DIASTOLIC ICA/CCA RATIO:

ECA:  229 cm/sec

LEFT

ICA:  Occluded cm/sec

CCA:  44 cm/sec

SYSTOLIC ICA/CCA RATIO:  Occluded

DIASTOLIC ICA/CCA RATIO:

ECA:  180 cm/sec

RIGHT CAROTID ARTERY: Mild scattered mixed plaque along the wall of
the common carotid artery. Moderate irregular mixed plaque in the
bulb. Low resistance internal carotid Doppler pattern with sharp
upstroke.

RIGHT VERTEBRAL ARTERY:  Antegrade.

LEFT CAROTID ARTERY: The left internal carotid artery is occluded at
the level of the bulb.

LEFT VERTEBRAL ARTERY:  Antegrade.
IMPRESSION: 50-69% stenosis in the right internal carotid artery.

Left internal carotid artery occlusion.
